# Patient Record
Sex: Female | Born: 1945 | Race: Black or African American | Hispanic: No | Marital: Single | Smoking: Never smoker
Health system: Southern US, Community
[De-identification: ages and names within clinical notes are randomized; demographics above are authoritative.]

## PROBLEM LIST (undated history)

## (undated) DIAGNOSIS — I10 Essential (primary) hypertension: Secondary | ICD-10-CM

## (undated) DIAGNOSIS — K219 Gastro-esophageal reflux disease without esophagitis: Secondary | ICD-10-CM

## (undated) DIAGNOSIS — M1990 Unspecified osteoarthritis, unspecified site: Secondary | ICD-10-CM

## (undated) DIAGNOSIS — D594 Other nonautoimmune hemolytic anemias: Secondary | ICD-10-CM

## (undated) HISTORY — DX: Gastro-esophageal reflux disease without esophagitis: K21.9

## (undated) HISTORY — DX: Other nonautoimmune hemolytic anemias: D59.4

## (undated) HISTORY — DX: Unspecified osteoarthritis, unspecified site: M19.90

## (undated) HISTORY — PX: BUNIONECTOMY: SHX129

## (undated) HISTORY — DX: Essential (primary) hypertension: I10

---

## 1986-01-28 HISTORY — PX: TUBAL LIGATION: SHX77

## 1997-04-28 ENCOUNTER — Encounter: Admission: RE | Admit: 1997-04-28 | Discharge: 1997-07-27 | Payer: Self-pay | Admitting: Family Medicine

## 1998-04-24 ENCOUNTER — Encounter: Admission: RE | Admit: 1998-04-24 | Discharge: 1998-04-28 | Payer: Self-pay | Admitting: Family Medicine

## 1998-11-27 ENCOUNTER — Encounter: Payer: Self-pay | Admitting: Obstetrics and Gynecology

## 1998-11-27 ENCOUNTER — Encounter: Admission: RE | Admit: 1998-11-27 | Discharge: 1998-11-27 | Payer: Self-pay | Admitting: Obstetrics and Gynecology

## 1998-12-04 ENCOUNTER — Encounter: Payer: Self-pay | Admitting: Obstetrics and Gynecology

## 1998-12-04 ENCOUNTER — Encounter: Admission: RE | Admit: 1998-12-04 | Discharge: 1998-12-04 | Payer: Self-pay | Admitting: Obstetrics and Gynecology

## 1999-12-06 ENCOUNTER — Encounter: Payer: Self-pay | Admitting: Obstetrics and Gynecology

## 1999-12-06 ENCOUNTER — Encounter: Admission: RE | Admit: 1999-12-06 | Discharge: 1999-12-06 | Payer: Self-pay | Admitting: Obstetrics and Gynecology

## 1999-12-17 ENCOUNTER — Encounter: Admission: RE | Admit: 1999-12-17 | Discharge: 1999-12-17 | Payer: Self-pay | Admitting: Obstetrics and Gynecology

## 1999-12-17 ENCOUNTER — Encounter: Payer: Self-pay | Admitting: Obstetrics and Gynecology

## 2000-07-17 ENCOUNTER — Other Ambulatory Visit: Admission: RE | Admit: 2000-07-17 | Discharge: 2000-07-17 | Payer: Self-pay | Admitting: Obstetrics and Gynecology

## 2000-12-17 ENCOUNTER — Encounter: Payer: Self-pay | Admitting: Obstetrics and Gynecology

## 2000-12-17 ENCOUNTER — Encounter: Admission: RE | Admit: 2000-12-17 | Discharge: 2000-12-17 | Payer: Self-pay | Admitting: Obstetrics and Gynecology

## 2002-02-24 ENCOUNTER — Encounter: Admission: RE | Admit: 2002-02-24 | Discharge: 2002-02-24 | Payer: Self-pay | Admitting: Internal Medicine

## 2002-02-24 ENCOUNTER — Encounter: Payer: Self-pay | Admitting: Internal Medicine

## 2002-09-23 ENCOUNTER — Encounter: Payer: Self-pay | Admitting: Obstetrics and Gynecology

## 2002-09-23 ENCOUNTER — Encounter: Admission: RE | Admit: 2002-09-23 | Discharge: 2002-09-23 | Payer: Self-pay | Admitting: Obstetrics and Gynecology

## 2003-01-29 HISTORY — PX: TOE SURGERY: SHX1073

## 2003-10-06 ENCOUNTER — Encounter: Admission: RE | Admit: 2003-10-06 | Discharge: 2003-10-06 | Payer: Self-pay | Admitting: Obstetrics and Gynecology

## 2004-01-20 ENCOUNTER — Ambulatory Visit: Payer: Self-pay | Admitting: Family Medicine

## 2004-01-27 ENCOUNTER — Ambulatory Visit: Payer: Self-pay | Admitting: Family Medicine

## 2004-02-10 ENCOUNTER — Ambulatory Visit: Payer: Self-pay | Admitting: Family Medicine

## 2004-02-10 ENCOUNTER — Ambulatory Visit: Payer: Self-pay | Admitting: Internal Medicine

## 2004-02-21 ENCOUNTER — Ambulatory Visit: Payer: Self-pay | Admitting: Internal Medicine

## 2005-02-27 ENCOUNTER — Ambulatory Visit: Payer: Self-pay | Admitting: Family Medicine

## 2005-03-20 ENCOUNTER — Ambulatory Visit: Payer: Self-pay | Admitting: Family Medicine

## 2005-04-24 ENCOUNTER — Encounter: Admission: RE | Admit: 2005-04-24 | Discharge: 2005-04-24 | Payer: Self-pay | Admitting: Family Medicine

## 2005-05-02 ENCOUNTER — Ambulatory Visit: Payer: Self-pay | Admitting: Family Medicine

## 2005-05-10 ENCOUNTER — Ambulatory Visit: Payer: Self-pay | Admitting: Family Medicine

## 2005-05-31 ENCOUNTER — Ambulatory Visit: Payer: Self-pay | Admitting: Family Medicine

## 2005-09-03 ENCOUNTER — Ambulatory Visit: Payer: Self-pay | Admitting: Family Medicine

## 2006-02-04 ENCOUNTER — Ambulatory Visit: Payer: Self-pay | Admitting: Family Medicine

## 2006-02-04 ENCOUNTER — Encounter: Admission: RE | Admit: 2006-02-04 | Discharge: 2006-02-04 | Payer: Self-pay | Admitting: Family Medicine

## 2006-02-04 LAB — CONVERTED CEMR LAB
Anti Nuclear Antibody(ANA): NEGATIVE
Basophils Absolute: 0.1 10*3/uL (ref 0.0–0.1)
Basophils Relative: 1.1 % — ABNORMAL HIGH (ref 0.0–1.0)
Eosinophil percent: 1.8 % (ref 0.0–5.0)
HCT: 37.6 % (ref 36.0–46.0)
Hemoglobin: 12.4 g/dL (ref 12.0–15.0)
Lymphocytes Relative: 41.7 % (ref 12.0–46.0)
MCHC: 33 g/dL (ref 30.0–36.0)
MCV: 82.9 fL (ref 78.0–100.0)
Platelets: 319 10*3/uL (ref 150–400)
RDW: 12.9 % (ref 11.5–14.6)
Rheumatoid Fact: <20 intl units/mL — ABNORMAL LOW (ref 0.0–20.0)
Uric Acid, Serum: 5.2 mg/dL (ref 2.4–7.0)

## 2006-02-13 ENCOUNTER — Ambulatory Visit: Payer: Self-pay | Admitting: Family Medicine

## 2006-02-18 ENCOUNTER — Ambulatory Visit: Payer: Self-pay | Admitting: Family Medicine

## 2006-03-27 ENCOUNTER — Ambulatory Visit: Payer: Self-pay | Admitting: Family Medicine

## 2006-04-16 ENCOUNTER — Ambulatory Visit: Payer: Self-pay | Admitting: Family Medicine

## 2006-04-16 LAB — CONVERTED CEMR LAB
ALT: 18 U/L
AST: 24 U/L
Albumin: 3.7 g/dL
Alkaline Phosphatase: 60 U/L
BUN: 13 mg/dL
Basophils Absolute: 0 10*3/uL
Basophils Relative: 0.7 %
Bilirubin, Direct: 0.1 mg/dL
CO2: 31 meq/L
Calcium: 8.9 mg/dL
Chloride: 108 meq/L
Cholesterol: 176 mg/dL
Creatinine, Ser: 0.8 mg/dL
Eosinophils Absolute: 0.1 10*3/uL
Eosinophils Relative: 2 %
GFR calc Af Amer: 95 mL/min
GFR calc non Af Amer: 78 mL/min
Glucose, Bld: 90 mg/dL
HCT: 36.1 %
HDL: 72.6 mg/dL
Hemoglobin: 12.3 g/dL
LDL Cholesterol: 93 mg/dL
Lymphocytes Relative: 39.3 %
MCHC: 33.9 g/dL
MCV: 82.2 fL
Monocytes Absolute: 0.6 10*3/uL
Monocytes Relative: 10.5 %
Neutro Abs: 2.7 10*3/uL
Neutrophils Relative %: 47.5 %
Platelets: 371 10*3/uL
Potassium: 3.4 meq/L — ABNORMAL LOW
RBC: 4.39 M/uL
RDW: 13.1 %
Sodium: 145 meq/L
TSH: 1.07 u[IU]/mL
Total Bilirubin: 1 mg/dL
Total CHOL/HDL Ratio: 2.4
Total Protein: 6.9 g/dL
Triglycerides: 54 mg/dL
VLDL: 11 mg/dL
WBC: 5.6 10*3/uL

## 2006-05-06 ENCOUNTER — Encounter: Admission: RE | Admit: 2006-05-06 | Discharge: 2006-05-06 | Payer: Self-pay | Admitting: Family Medicine

## 2006-06-17 ENCOUNTER — Other Ambulatory Visit: Admission: RE | Admit: 2006-06-17 | Discharge: 2006-06-17 | Payer: Self-pay | Admitting: Family Medicine

## 2006-06-17 ENCOUNTER — Ambulatory Visit: Payer: Self-pay | Admitting: Family Medicine

## 2006-06-17 ENCOUNTER — Encounter: Payer: Self-pay | Admitting: Family Medicine

## 2006-06-17 LAB — CONVERTED CEMR LAB

## 2006-06-18 ENCOUNTER — Encounter: Payer: Self-pay | Admitting: Family Medicine

## 2006-06-18 LAB — CONVERTED CEMR LAB

## 2006-07-08 ENCOUNTER — Ambulatory Visit: Payer: Self-pay | Admitting: Family Medicine

## 2006-07-08 DIAGNOSIS — M810 Age-related osteoporosis without current pathological fracture: Secondary | ICD-10-CM | POA: Insufficient documentation

## 2006-07-08 DIAGNOSIS — M8580 Other specified disorders of bone density and structure, unspecified site: Secondary | ICD-10-CM

## 2006-07-08 DIAGNOSIS — I10 Essential (primary) hypertension: Secondary | ICD-10-CM | POA: Insufficient documentation

## 2007-01-29 HISTORY — PX: KNEE ARTHROSCOPY: SUR90

## 2007-05-07 ENCOUNTER — Encounter: Admission: RE | Admit: 2007-05-07 | Discharge: 2007-05-07 | Payer: Self-pay | Admitting: Family Medicine

## 2007-06-10 ENCOUNTER — Ambulatory Visit: Payer: Self-pay | Admitting: Family Medicine

## 2007-06-10 LAB — CONVERTED CEMR LAB
ALT: 15 units/L (ref 0–35)
AST: 19 units/L (ref 0–37)
Albumin: 3.6 g/dL (ref 3.5–5.2)
BUN: 14 mg/dL (ref 6–23)
Basophils Relative: 0.6 % (ref 0.0–1.0)
Blood in Urine, dipstick: NEGATIVE
CO2: 29 meq/L (ref 19–32)
Chloride: 109 meq/L (ref 96–112)
Cholesterol: 176 mg/dL (ref 0–200)
Creatinine, Ser: 0.8 mg/dL (ref 0.4–1.2)
Glucose, Bld: 102 mg/dL — ABNORMAL HIGH (ref 70–99)
Glucose, Urine, Semiquant: NEGATIVE
HCT: 35.5 % — ABNORMAL LOW (ref 36.0–46.0)
HDL: 72.9 mg/dL (ref >39.0)
Hemoglobin: 11.6 g/dL — ABNORMAL LOW (ref 12.0–15.0)
LDL Cholesterol: 96 mg/dL (ref 0–99)
MCHC: 32.5 g/dL (ref 30.0–36.0)
Monocytes Absolute: 0.5 10*3/uL (ref 0.1–1.0)
Neutro Abs: 2.6 10*3/uL (ref 1.4–7.7)
Nitrite: NEGATIVE
Potassium: 3.4 meq/L — ABNORMAL LOW (ref 3.5–5.1)
Sodium: 143 meq/L (ref 135–145)
TSH: 0.64 microintl units/mL (ref 0.35–5.50)
Total CHOL/HDL Ratio: 2.4
Triglycerides: 36 mg/dL (ref 0–149)
Urobilinogen, UA: 0.2
VLDL: 7 mg/dL (ref 0–40)
WBC: 5.2 10*3/uL (ref 4.5–10.5)
pH: 5.5

## 2007-06-18 ENCOUNTER — Ambulatory Visit: Payer: Self-pay | Admitting: Family Medicine

## 2007-06-18 ENCOUNTER — Encounter: Payer: Self-pay | Admitting: Family Medicine

## 2007-06-18 ENCOUNTER — Other Ambulatory Visit: Admission: RE | Admit: 2007-06-18 | Discharge: 2007-06-18 | Payer: Self-pay | Admitting: Family Medicine

## 2007-06-18 DIAGNOSIS — D6489 Other specified anemias: Secondary | ICD-10-CM | POA: Insufficient documentation

## 2007-06-18 LAB — CONVERTED CEMR LAB
Eosinophils Absolute: 0.1 10*3/uL (ref 0.0–0.7)
Hemoglobin: 12 g/dL (ref 12.0–15.0)
Iron: 64 ug/dL (ref 42–145)
MCV: 83.2 fL (ref 78.0–100.0)
Monocytes Relative: 9.3 % (ref 3.0–12.0)
Neutro Abs: 3.2 10*3/uL (ref 1.4–7.7)
Platelets: 349 10*3/uL (ref 150–400)
RBC: 4.47 M/uL (ref 3.87–5.11)
RDW: 12.9 % (ref 11.5–14.6)
Retic Ct Pct: 0.8 % (ref 0.4–3.1)
Saturation Ratios: 19.4 % — ABNORMAL LOW (ref 20.0–50.0)
Vitamin B-12: 770 pg/mL (ref 211–911)
WBC: 6.8 10*3/uL (ref 4.5–10.5)

## 2007-06-21 ENCOUNTER — Telehealth: Payer: Self-pay | Admitting: Family Medicine

## 2007-07-10 DIAGNOSIS — M25569 Pain in unspecified knee: Secondary | ICD-10-CM

## 2007-07-20 ENCOUNTER — Ambulatory Visit: Payer: Self-pay | Admitting: Family Medicine

## 2007-07-23 ENCOUNTER — Ambulatory Visit: Payer: Self-pay | Admitting: Family Medicine

## 2007-07-24 ENCOUNTER — Telehealth: Payer: Self-pay | Admitting: Family Medicine

## 2007-08-03 ENCOUNTER — Ambulatory Visit: Payer: Self-pay | Admitting: Family Medicine

## 2007-08-03 DIAGNOSIS — M79609 Pain in unspecified limb: Secondary | ICD-10-CM | POA: Insufficient documentation

## 2008-03-05 DIAGNOSIS — J069 Acute upper respiratory infection, unspecified: Secondary | ICD-10-CM | POA: Insufficient documentation

## 2008-03-11 ENCOUNTER — Ambulatory Visit: Payer: Self-pay | Admitting: Family Medicine

## 2008-03-11 DIAGNOSIS — J45909 Unspecified asthma, uncomplicated: Secondary | ICD-10-CM | POA: Insufficient documentation

## 2008-04-21 DIAGNOSIS — G43819 Other migraine, intractable, without status migrainosus: Secondary | ICD-10-CM

## 2008-04-21 DIAGNOSIS — G43119 Migraine with aura, intractable, without status migrainosus: Secondary | ICD-10-CM | POA: Insufficient documentation

## 2008-04-27 ENCOUNTER — Ambulatory Visit: Payer: Self-pay | Admitting: Family Medicine

## 2008-05-09 ENCOUNTER — Encounter: Admission: RE | Admit: 2008-05-09 | Discharge: 2008-05-09 | Payer: Self-pay | Admitting: Family Medicine

## 2008-06-10 ENCOUNTER — Ambulatory Visit: Payer: Self-pay | Admitting: Family Medicine

## 2008-06-10 LAB — CONVERTED CEMR LAB
ALT: 15 units/L (ref 0–35)
Alkaline Phosphatase: 69 units/L (ref 39–117)
Basophils Absolute: 0 10*3/uL (ref 0.0–0.1)
Basophils Relative: 0.7 % (ref 0.0–3.0)
Bilirubin Urine: NEGATIVE
Bilirubin, Direct: 0 mg/dL (ref 0.0–0.3)
CO2: 32 meq/L (ref 19–32)
Chloride: 108 meq/L (ref 96–112)
Creatinine, Ser: 1 mg/dL (ref 0.4–1.2)
Eosinophils Absolute: 0.1 10*3/uL (ref 0.0–0.7)
Eosinophils Relative: 2.4 % (ref 0.0–5.0)
GFR calc non Af Amer: 72.6 mL/min (ref >60)
Glucose, Urine, Semiquant: NEGATIVE
HCT: 35 % — ABNORMAL LOW (ref 36.0–46.0)
HDL: 75.6 mg/dL (ref >39.00)
Hemoglobin: 11.4 g/dL — ABNORMAL LOW (ref 12.0–15.0)
LDL Cholesterol: 99 mg/dL (ref 0–99)
Lymphs Abs: 2 10*3/uL (ref 0.7–4.0)
Monocytes Relative: 9.7 % (ref 3.0–12.0)
Neutrophils Relative %: 49.1 % (ref 43.0–77.0)
Potassium: 3.7 meq/L (ref 3.5–5.1)
RBC: 4.13 M/uL (ref 3.87–5.11)
Specific Gravity, Urine: 1.025
TSH: 1 microintl units/mL (ref 0.35–5.50)
Total CHOL/HDL Ratio: 2
Total Protein: 6.6 g/dL (ref 6.0–8.3)
WBC Urine, dipstick: NEGATIVE
pH: 6

## 2008-06-16 ENCOUNTER — Ambulatory Visit: Payer: Self-pay | Admitting: Family Medicine

## 2008-06-16 ENCOUNTER — Encounter: Payer: Self-pay | Admitting: Family Medicine

## 2008-06-16 ENCOUNTER — Other Ambulatory Visit: Admission: RE | Admit: 2008-06-16 | Discharge: 2008-06-16 | Payer: Self-pay | Admitting: Family Medicine

## 2008-08-11 ENCOUNTER — Telehealth: Payer: Self-pay | Admitting: Family Medicine

## 2008-08-11 ENCOUNTER — Ambulatory Visit: Payer: Self-pay | Admitting: Family Medicine

## 2008-08-11 LAB — CONVERTED CEMR LAB
Basophils Absolute: 0.1 10*3/uL (ref 0.0–0.1)
HCT: 34.6 % — ABNORMAL LOW (ref 36.0–46.0)
Lymphs Abs: 1.9 10*3/uL (ref 0.7–4.0)
MCHC: 33.6 g/dL (ref 30.0–36.0)
Monocytes Relative: 9.9 % (ref 3.0–12.0)
Neutro Abs: 2.6 10*3/uL (ref 1.4–7.7)
Platelets: 281 10*3/uL (ref 150.0–400.0)
RBC: 4.13 M/uL (ref 3.87–5.11)
WBC: 5.2 10*3/uL (ref 4.5–10.5)

## 2008-08-12 ENCOUNTER — Ambulatory Visit: Payer: Self-pay | Admitting: Family Medicine

## 2008-10-05 ENCOUNTER — Ambulatory Visit: Payer: Self-pay | Admitting: Family Medicine

## 2008-10-06 LAB — CONVERTED CEMR LAB
Eosinophils Absolute: 0.1 10*3/uL (ref 0.0–0.7)
Eosinophils Relative: 1.3 % (ref 0.0–5.0)
HCT: 37.6 % (ref 36.0–46.0)
Hemoglobin: 12.5 g/dL (ref 12.0–15.0)
Lymphocytes Relative: 34.6 % (ref 12.0–46.0)
MCHC: 33.2 g/dL (ref 30.0–36.0)
MCV: 84 fL (ref 78.0–100.0)
Monocytes Absolute: 0.5 10*3/uL (ref 0.1–1.0)
Monocytes Relative: 8.3 % (ref 3.0–12.0)
Neutrophils Relative %: 55.5 % (ref 43.0–77.0)
Platelets: 304 10*3/uL (ref 150.0–400.0)
RBC: 4.48 M/uL (ref 3.87–5.11)
RDW: 12.8 % (ref 11.5–14.6)

## 2008-12-26 ENCOUNTER — Telehealth: Payer: Self-pay | Admitting: Family Medicine

## 2009-05-11 ENCOUNTER — Encounter: Admission: RE | Admit: 2009-05-11 | Discharge: 2009-05-11 | Payer: Self-pay | Admitting: Family Medicine

## 2009-05-11 LAB — HM MAMMOGRAPHY

## 2009-06-14 ENCOUNTER — Ambulatory Visit: Payer: Self-pay | Admitting: Family Medicine

## 2009-06-14 LAB — CONVERTED CEMR LAB
Alkaline Phosphatase: 55 units/L (ref 39–117)
BUN: 21 mg/dL (ref 6–23)
Calcium: 9.5 mg/dL (ref 8.4–10.5)
Chloride: 102 meq/L (ref 96–112)
Cholesterol: 202 mg/dL — ABNORMAL HIGH (ref 0–200)
Creatinine, Ser: 0.8 mg/dL (ref 0.4–1.2)
Glucose, Bld: 90 mg/dL (ref 70–99)
HDL: 89.6 mg/dL (ref >39.00)
Lymphs Abs: 2.3 10*3/uL (ref 0.7–4.0)
MCHC: 33.9 g/dL (ref 30.0–36.0)
MCV: 82.7 fL (ref 78.0–100.0)
Monocytes Absolute: 0.5 10*3/uL (ref 0.1–1.0)
Monocytes Relative: 7.9 % (ref 3.0–12.0)
Neutro Abs: 3 10*3/uL (ref 1.4–7.7)
Neutrophils Relative %: 50.7 % (ref 43.0–77.0)
Nitrite: NEGATIVE
Platelets: 325 10*3/uL (ref 150.0–400.0)
RDW: 14.2 % (ref 11.5–14.6)
Specific Gravity, Urine: >=1.03
Total Bilirubin: 1.1 mg/dL (ref 0.3–1.2)
Total CHOL/HDL Ratio: 2
Total Protein: 7.3 g/dL (ref 6.0–8.3)
Triglycerides: 32 mg/dL (ref 0.0–149.0)
Urobilinogen, UA: 0.2
WBC Urine, dipstick: NEGATIVE
WBC: 5.9 10*3/uL (ref 4.5–10.5)

## 2009-06-20 ENCOUNTER — Other Ambulatory Visit: Admission: RE | Admit: 2009-06-20 | Discharge: 2009-06-20 | Payer: Self-pay | Admitting: Family Medicine

## 2009-06-20 ENCOUNTER — Ambulatory Visit: Payer: Self-pay | Admitting: Family Medicine

## 2009-06-20 LAB — CONVERTED CEMR LAB: Pap Smear: NEGATIVE

## 2009-06-20 LAB — HM PAP SMEAR

## 2009-08-01 ENCOUNTER — Ambulatory Visit: Payer: Self-pay | Admitting: Family Medicine

## 2010-01-16 ENCOUNTER — Ambulatory Visit: Payer: Self-pay | Admitting: Family Medicine

## 2010-02-09 ENCOUNTER — Ambulatory Visit
Admission: RE | Admit: 2010-02-09 | Discharge: 2010-02-09 | Payer: Self-pay | Source: Home / Self Care | Attending: Family Medicine | Admitting: Family Medicine

## 2010-02-27 NOTE — Assessment & Plan Note (Signed)
Summary: COUGH/NASAL CONGESTION/BP/NJR   Vital Signs:  Patient profile:   65 year old female Menstrual status:  postmenopausal Temp:     98.9 degrees F 140oral Resp:     96 per minute BP sitting:   140 / 96  (left arm) Cuff size:   regular  Vitals Entered By: Kern Reap CMA Duncan Dull) (August 01, 2009 4:46 PM) CC: head congestion   CC:  head congestion.  History of Present Illness: Kayla Key is a 65 year old female, who comes in with a week's history of head congestion, postnasal drip, and cough.  No fever, chills, sputum production, asthma, etc.  Allergies: No Known Drug Allergies  Past History:  Past medical, surgical, family and social histories (including risk factors) reviewed, and no changes noted (except as noted below).  Past Medical History: Reviewed history from 03/11/2008 and no changes required. Hypertension Osteoporosis MHA Asthma  Past Surgical History: Reviewed history from 07/08/2006 and no changes required. BTL  Family History: Reviewed history from 06/16/2008 and no changes required. father died of a stroke in 1998/06/28 mother died of TB.  A brother has a pacemaker a sister with hypertension Family History Breast cancer 1st degree relative <50  Social History: Reviewed history from 06/18/2007 and no changes required. Retired Married Never Smoked Alcohol use-no Drug use-no Regular exercise-yes  Review of Systems      See HPI  Physical Exam  General:  Well-developed,well-nourished,in no acute distress; alert,appropriate and cooperative throughout examination Head:  Normocephalic and atraumatic without obvious abnormalities. No apparent alopecia or balding. Eyes:  No corneal or conjunctival inflammation noted. EOMI. Perrla. Funduscopic exam benign, without hemorrhages, exudates or papilledema. Vision grossly normal. Ears:  bilateral cerumen impactions Nose:  External nasal examination shows no deformity or inflammation. Nasal mucosa are pink and  moist without lesions or exudates. Mouth:  Oral mucosa and oropharynx without lesions or exudates.  Teeth in good repair. Neck:  No deformities, masses, or tenderness noted. Chest Wall:  No deformities, masses, or tenderness noted. Lungs:  Normal respiratory effort, chest expands symmetrically. Lungs are clear to auscultation, no crackles or wheezes.   Problems:  Medical Problems Added: 1)  Dx of Viral Infection-unspec  (ICD-079.99)  Impression & Recommendations:  Problem # 1:  VIRAL INFECTION-UNSPEC (ICD-079.99) Assessment New  Her updated medication list for this problem includes:    Hydromet 5-1.5 Mg/51ml Syrp (Hydrocodone-homatropine) .Marland Kitchen... 1 or 2 tsps at bedtime as needed  Complete Medication List: 1)  Nadolol 20 Mg Tabs (Nadolol) .... Once daily 2)  Fosamax 70 Mg Tabs (Alendronate sodium) .... One weekly 3)  Hydromet 5-1.5 Mg/46ml Syrp (Hydrocodone-homatropine) .Marland Kitchen.. 1 or 2 tsps at bedtime as needed  Patient Instructions: 1)  Get plenty of rest, drink lots of clear liquids, and use Tylenol or Ibuprofen for fever and comfort. Return in 7-10 days if you're not better:sooner if you're feeling worse. 2)  Take 650-1000mg  of Tylenol every 4-6 hours as needed for relief of pain or comfort of fever AVOID taking more than 4000mg   in a 24 hour period (can cause liver damage in higher doses). 3)  one or 2 teaspoons of Tagamet nightly p.r.n. for cough.  Return in 10 days for ear wax removal Prescriptions: HYDROMET 5-1.5 MG/5ML SYRP (HYDROCODONE-HOMATROPINE) 1 or 2 tsps at bedtime as needed  #4oz x 1   Entered and Authorized by:   Roderick Pee MD   Signed by:   Roderick Pee MD on 08/01/2009   Method used:   Print then  Give to Patient   RxID:   [id number]

## 2010-02-27 NOTE — Assessment & Plan Note (Signed)
Summary: cpx/pap/cjr   Vital Signs:  Patient profile:   65 year old female Menstrual status:  postmenopausal Height:      62.5 inches Weight:      125 pounds BMI:     22.58 Temp:     97.9 degrees F oral BP sitting:   102 / 68  (left arm) Cuff size:   regular  Vitals Entered By: Kern Reap CMA Duncan Dull) (Jun 20, 2009 3:56 PM) CC: cpx Is Patient Diabetic? No Pain Assessment Patient in pain? no        CC:  cpx.  History of Present Illness: Kayla Key is a 65 year old female, nonsmoker, who comes in today for evaluation of hypertension, osteoporosis, and migraine headaches.  Her hypertension is treated with Corgard 20 mg daily and her thiazide 25 mg daily.  BP 110/68.  Will drop the HCTZ.  She takes Fosamax one tablet weekly.  Discussed using at first 3 years and then stopping.  She did not get the Zomig and refill because her migraines and stopped.  Review of systems negative.  Tetanus 06/18/06  She gets routine eye care, dental care, does BSE monthly........ has fibrocystic changes, and sister. 3 years younger, was diagnosed last year with breast cancer.......Marland Kitchen gets annual mammography.  Colonoscopy Jun 17, 2004 normal.  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  Past Medical History: Reviewed history from 03/11/2008 and no changes required. Hypertension Osteoporosis MHA Asthma  Past Surgical History: Reviewed history from 07/08/2006 and no changes required. BTL  Family History: Reviewed history from 06/16/2008 and no changes required. father died of a stroke in 06-18-1998 mother died of TB.  A brother has a pacemaker a sister with hypertension Family History Breast cancer 1st degree relative <50  Social History: Reviewed history from 06/18/2007 and no changes  required. Retired Married Never Smoked Alcohol use-no Drug use-no Regular exercise-yes  Review of Systems      See HPI  Physical Exam  General:  Well-developed,well-nourished,in no acute distress; alert,appropriate and cooperative throughout examination Head:  Normocephalic and atraumatic without obvious abnormalities. No apparent alopecia or balding. Eyes:  No corneal or conjunctival inflammation noted. EOMI. Perrla. Funduscopic exam benign, without hemorrhages, exudates or papilledema. Vision grossly normal. Ears:  External ear exam shows no significant lesions or deformities.  Otoscopic examination reveals clear canals, tympanic membranes are intact bilaterally without bulging, retraction, inflammation or discharge. Hearing is grossly normal bilaterally. Nose:  External nasal examination shows no deformity or inflammation. Nasal mucosa are pink and moist without lesions or exudates. Mouth:  Oral mucosa and oropharynx without lesions or exudates.  Teeth in good repair. Neck:  No deformities, masses, or tenderness noted. Chest Wall:  No deformities, masses, or tenderness noted. Breasts:  No mass, nodules, thickening, tenderness, bulging, retraction, inflamation, nipple discharge or skin changes noted.   Lungs:  Normal respiratory effort, chest expands symmetrically. Lungs are clear to auscultation, no crackles or wheezes. Heart:  Normal rate and regular rhythm. S1 and S2 normal without gallop, murmur, click, rub or other extra sounds. Abdomen:  Bowel sounds positive,abdomen soft and non-tender without masses, organomegaly or hernias noted. Rectal:  No external abnormalities noted. Normal sphincter tone. No rectal masses or tenderness. Genitalia:  Pelvic Exam:        External: normal female genitalia without lesions or masses  Vagina: normal without lesions or masses        Cervix: normal without lesions or masses        Adnexa: normal bimanual exam without masses or fullness         Uterus: normal by palpation        Pap smear: performed Msk:  No deformity or scoliosis noted of thoracic or lumbar spine.   Pulses:  R and L carotid,radial,femoral,dorsalis pedis and posterior tibial pulses are full and equal bilaterally Extremities:  No clubbing, cyanosis, edema, or deformity noted with normal full range of motion of all joints.   Neurologic:  No cranial nerve deficits noted. Station and gait are normal. Plantar reflexes are down-going bilaterally. DTRs are symmetrical throughout. Sensory, motor and coordinative functions appear intact. Skin:  Intact without suspicious lesions or rashes Cervical Nodes:  No lymphadenopathy noted Axillary Nodes:  No palpable lymphadenopathy Inguinal Nodes:  No significant adenopathy Psych:  Cognition and judgment appear intact. Alert and cooperative with normal attention span and concentration. 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 Uad    Vicodin Es 7.5-750 Mg Tabs (Hydrocodone-acetaminophen) .Marland Kitchen... Take 1 tablet by mouth three times a day Her updated medication list for this problem includes:    Nadolol 20 Mg Tabs (Nadolol) ..... Once daily  Orders: Prescription Created Electronically [phone])  Problem # 2:  OSTEOPOROSIS (ICD-733.00) Assessment: Improved  Her updated medication list for this problem includes:    Fosamax 70 Mg Tabs (Alendronate sodium) ..... One weekly  Orders: Prescription Created Electronically [id number])  Problem # 3:  HYPERTENSION (ICD-401.9) Assessment: Improved  The following medications were removed from the medication list:    Hydrochlorothiazide 25 Mg Tabs (Hydrochlorothiazide) .Marland Kitchen... Take 1 tablet by mouth every morning Her updated medication list for this problem  includes:    Nadolol 20 Mg Tabs (Nadolol) ..... Once daily  Orders: Prescription Created Electronically [phone]) EKG w/ Interpretation (93000)  Problem # 4:  Preventive Health Care (ICD-V70.0) Assessment: Unchanged  Complete Medication List: 1)  Nadolol 20 Mg Tabs (Nadolol) .... Once daily 2)  Fosamax 70 Mg Tabs (Alendronate sodium) .... One weekly  Patient Instructions: 1)  stopped the HCTZ, continued nadolol  20 mg daily.  Check your blood pressure daily follow up in 4 weeks.  When u  returned bring a record of all your blood pressure readings 2)  Please schedule a follow-up appointment in 1 year. 3)  Schedule your mammogram. 4)  Schedule a colonoscopy/sigmoidoscopy to help detect colon cancer. 5)  Take an Aspirin every day...Marland KitchenMarland Kitchen81 mg baby  6)  Stop the Fosamax............ take a calcium, vitamin D supplement daily Prescriptions: FOSAMAX 70 MG  TABS (ALENDRONATE SODIUM) one weekly  #12 x 4   Entered and Authorized by:   Roderick Pee MD   Signed by:   Roderick Pee MD on 06/20/2009   Method used:   Electronically to        [facility]  [address] [id number] (retail)       [address]       [place], [place]  [address]       Ph: [phone] or [phone]       Fax: [phone]   RxID:   [id number] NADOLOL 20 MG  TABS (  NADOLOL) once daily  #100 Tablet x 3   Entered and Authorized by:   Roderick Pee MD   Signed by:   Roderick Pee MD on 06/20/2009   Method used:   [facility]ronically to        [facility]  [address] [id number] (retail[facility]       [address]       [place], [place]  [address]       Ph: [phone] or [phone]       Fax: [phone]   RxID:   [id number]

## 2010-03-01 NOTE — Assessment & Plan Note (Signed)
Summary: elevated BP/dm   Vital Signs:  Patient profile:   65 year old female Menstrual status:  postmenopausal Weight:      128 pounds Temp:     98.5 degrees F oral BP sitting:   140 / 90  (left arm) Cuff size:   regular  Vitals Entered By: Kern Reap CMA Duncan Dull) (January 16, 2010 8:55 AM) CC: elevated blood pressure, headaches   CC:  elevated blood pressure and headaches.  History of Present Illness: Kayla Key is a 65 year old female comes in today for reevaluation of hypertension.  She is on Corgard 20 mg q.a.m.  Blood pressure is home now is 150 to 160 systolic, diastolic 80 to 90.  Review of systems negative except for a mild headache  Allergies: No Known Drug Allergies  Past History:  Past medical, surgical, family and social histories (including risk factors) reviewed for relevance to current acute and chronic problems.  Past Medical History: Reviewed history from 03/11/2008 and no changes required. Hypertension Osteoporosis MHA Asthma  Past Surgical History: Reviewed history from 07/08/2006 and no changes required. BTL  Family History: Reviewed history from 06/16/2008 and no changes required. father died of a stroke in 1998-07-01 mother died of TB.  A brother has a pacemaker a sister with hypertension Family History Breast cancer 1st degree relative <50  Social History: Reviewed history from 06/18/2007 and no changes required. Retired Married Never Smoked Alcohol use-no Drug use-no Regular exercise-yes  Review of Systems      See HPI  Physical Exam  General:  Well-developed,well-nourished,in no acute distress; alert,appropriate and cooperative throughout examination Heart:  160/90 right arm sitting position   Impression & Recommendations:  Problem # 1:  HYPERTENSION (ICD-401.9) Assessment Deteriorated  Her updated medication list for this problem includes:    Nadolol 20 Mg Tabs (Nadolol) ..... Once daily    Hydrochlorothiazide 25 Mg Tabs  (Hydrochlorothiazide) .Marland Kitchen... Take 1 tablet by mouth every morning  Orders: Prescription Created Electronically [id number])  Complete Medication List: 1)  Nadolol 20 Mg Tabs (Nadolol) .... Once daily 2)  Hydrochlorothiazide 25 Mg Tabs (Hydrochlorothiazide) .... Take 1 tablet by mouth every morning  Patient Instructions: 1)  add 25 mg of hydrochlorothiazide every morning. 2)  Check a daily blood pressure in the morning. 3)  Return January, the 11th for follow-up with the data and the device Prescriptions: HYDROCHLOROTHIAZIDE 25 MG TABS (HYDROCHLOROTHIAZIDE) Take 1 tablet by mouth every morning  #100 x 3   Entered and Authorized by:   Roderick Pee MD   Signed by:   Roderick Pee MD on 01/16/2010   Method used:   [facility] to        [facility]  [address] [id number]       [address]       [place], [place]  [address]       Ph: [phone] or [phone]       Fax: [phone]   RxID:   [id number]    Orders Added: 1)  Prescription Created Electronically [[id number]] 2)  Est. Patient Level III [56433]

## 2010-03-01 NOTE — Assessment & Plan Note (Signed)
Summary: 2 wk rov/mm   Vital Signs:  Patient profile:   65 year old female Menstrual status:  postmenopausal Weight:      126 pounds Temp:     98.2 degrees F oral BP sitting:   110 / 70  (left arm) Cuff size:   regular  Vitals Entered By: Romualdo Bolk, CMA (AAMA) (February 09, 2010 9:10 AM) CC: 2 Week Rov   CC:  2 Week Rov.  History of Present Illness: Kayla Key is a 65 year old female, who comes in today for evaluation of joint pain, and hypertension.  She is on Corgard 20 mg daily and hydrochlorothiazide 25 mg daily.  BP 120/80.  No side effects from medication.  She also has diffuse joint pain.  It tends to be worse in the morning and better when she gets up and moves around also worse when he gets cold.  Review of systems negative  Preventive Screening-Counseling & Management  Alcohol-Tobacco     Smoking Status: never  Caffeine-Diet-Exercise     Does Patient Exercise: yes  Current Medications (verified): 1)  Nadolol 20 Mg  Tabs (Nadolol) .... Once Daily 2)  Hydrochlorothiazide 25 Mg Tabs (Hydrochlorothiazide) .... Take 1 Tablet By Mouth Every Morning  Allergies (verified): No Known Drug Allergies  Past History:  Past medical, surgical, family and social histories (including risk factors) reviewed for relevance to current acute and chronic problems.  Past Medical History: Reviewed history from 03/11/2008 and no changes required. Hypertension Osteoporosis MHA Asthma  Past Surgical History: Reviewed history from 07/08/2006 and no changes required. BTL  Family History: Reviewed history from 06/16/2008 and no changes required. father died of a stroke in Jun 18, 1998 mother died of TB.  A brother has a pacemaker a sister with hypertension Family History Breast cancer 1st degree relative <50  Social History: Reviewed history from 06/18/2007 and no changes required. Retired Married Never Smoked Alcohol use-no Drug use-no Regular exercise-yes  Review of  Systems      See HPI  Physical Exam  General:  Well-developed,well-nourished,in no acute distress; alert,appropriate and cooperative throughout examination Heart:  120/80 right arm sitting position   Impression & Recommendations:  Problem # 1:  HYPERTENSION (ICD-401.9) Assessment Improved  Her updated medication list for this problem includes:    Nadolol 20 Mg Tabs (Nadolol) ..... Once daily    Hydrochlorothiazide 25 Mg Tabs (Hydrochlorothiazide) .Marland Kitchen... Take 1 tablet by mouth every morning  Complete Medication List: 1)  Nadolol 20 Mg Tabs (Nadolol) .... Once daily 2)  Hydrochlorothiazide 25 Mg Tabs (Hydrochlorothiazide) .... Take 1 tablet by mouth every morning  Patient Instructions: 1)  continue current blood pressure medications. 2)  Return in 2022-06-18 for your annual exam. 3)  Motrin 400 mg twice daily with food. 4)  BMP prior to visit, ICD-9: 5)  Hepatic Panel prior to visit, ICD-9:...Marland KitchenMarland Kitchen401.9 6)  Lipid Panel prior to visit, ICD-9: 7)  TSH prior to visit, ICD-9: 8)  CBC w/ Diff prior to visit, ICD-9: 9)  Urine-dip prior to visit, ICD-9:   Orders Added: 1)  Est. Patient Level III [16109]

## 2010-04-20 ENCOUNTER — Other Ambulatory Visit: Payer: Self-pay | Admitting: Family Medicine

## 2010-04-20 DIAGNOSIS — Z1231 Encounter for screening mammogram for malignant neoplasm of breast: Secondary | ICD-10-CM

## 2010-05-23 ENCOUNTER — Ambulatory Visit
Admission: RE | Admit: 2010-05-23 | Discharge: 2010-05-23 | Disposition: A | Discharge status: Home / Self Care | Payer: Federal, State, Local not specified - PPO | Source: Ambulatory Visit | Attending: Family Medicine | Admitting: Family Medicine

## 2010-05-23 DIAGNOSIS — Z1231 Encounter for screening mammogram for malignant neoplasm of breast: Secondary | ICD-10-CM

## 2010-06-13 ENCOUNTER — Ambulatory Visit (INDEPENDENT_AMBULATORY_CARE_PROVIDER_SITE_OTHER): Payer: Federal, State, Local not specified - PPO | Admitting: Family Medicine

## 2010-06-13 DIAGNOSIS — Z0000 Encounter for general adult medical examination without abnormal findings: Secondary | ICD-10-CM

## 2010-06-15 ENCOUNTER — Other Ambulatory Visit (INDEPENDENT_AMBULATORY_CARE_PROVIDER_SITE_OTHER): Payer: Federal, State, Local not specified - PPO | Admitting: Family Medicine

## 2010-06-15 DIAGNOSIS — Z0000 Encounter for general adult medical examination without abnormal findings: Secondary | ICD-10-CM

## 2010-06-15 LAB — HEPATIC FUNCTION PANEL
ALT: 20 U/L (ref 0–35)
AST: 21 U/L (ref 0–37)
Albumin: 3.2 g/dL — ABNORMAL LOW (ref 3.5–5.2)
Bilirubin, Direct: 0 mg/dL (ref 0.0–0.3)
Total Bilirubin: 0.4 mg/dL (ref 0.3–1.2)

## 2010-06-15 LAB — CBC WITH DIFFERENTIAL/PLATELET
Basophils Absolute: 0 10*3/uL (ref 0.0–0.1)
Basophils Relative: 0.5 % (ref 0.0–3.0)
Hemoglobin: 11.6 g/dL — ABNORMAL LOW (ref 12.0–15.0)
Lymphocytes Relative: 36.2 % (ref 12.0–46.0)
Neutro Abs: 3.2 10*3/uL (ref 1.4–7.7)
Neutrophils Relative %: 53 % (ref 43.0–77.0)
Platelets: 341 10*3/uL (ref 150.0–400.0)
RBC: 4.22 Mil/uL (ref 3.87–5.11)
WBC: 6 10*3/uL (ref 4.5–10.5)

## 2010-06-15 LAB — POCT URINALYSIS DIPSTICK
Blood, UA: NEGATIVE
Ketones, UA: NEGATIVE
Protein, UA: NEGATIVE
Spec Grav, UA: 1.03
Urobilinogen, UA: 0.2

## 2010-06-15 LAB — BASIC METABOLIC PANEL
GFR: 76.52 mL/min (ref >60.00)
Glucose, Bld: 84 mg/dL (ref 70–99)
Potassium: 5.1 mEq/L (ref 3.5–5.1)

## 2010-06-15 LAB — TB SKIN TEST
Induration: 0
TB Skin Test: NEGATIVE mm

## 2010-06-15 LAB — LIPID PANEL
Cholesterol: 147 mg/dL (ref 0–200)
HDL: 71.8 mg/dL (ref >39.00)

## 2010-06-22 ENCOUNTER — Ambulatory Visit: Payer: Self-pay | Admitting: Family Medicine

## 2010-06-25 ENCOUNTER — Other Ambulatory Visit: Payer: Self-pay | Admitting: Family Medicine

## 2010-06-28 ENCOUNTER — Encounter: Payer: Self-pay | Admitting: Family Medicine

## 2010-06-28 ENCOUNTER — Ambulatory Visit (INDEPENDENT_AMBULATORY_CARE_PROVIDER_SITE_OTHER): Payer: Federal, State, Local not specified - PPO | Admitting: Family Medicine

## 2010-06-28 VITALS — BP 110/70 | Temp 98.0°F | Ht 62.75 in | Wt 126.0 lb

## 2010-06-28 DIAGNOSIS — D6489 Other specified anemias: Secondary | ICD-10-CM

## 2010-06-28 DIAGNOSIS — I10 Essential (primary) hypertension: Secondary | ICD-10-CM

## 2010-06-28 MED ORDER — HYDROCHLOROTHIAZIDE 25 MG PO TABS: 25.0000 mg | ORAL_TABLET | ORAL | Status: DC

## 2010-06-28 MED ORDER — NADOLOL 20 MG PO TABS: 20.0000 mg | ORAL_TABLET | Freq: Every day | ORAL | Status: DC

## 2010-06-28 NOTE — Progress Notes (Signed)
  Subjective:    Patient ID: Kayla Key, female    DOB: 1945/08/27, 65 y.o.   MRN: [id number]  HPI Kayla Key is a delightful, 65 year old, married female, nonsmoker with comes in today for general physical examination because of a history of anemia, etiology unknown, hypertension, and a new problem of pain in her right shoulder.  She takes hydrochlorothiazide 25 mg daily and Corgard 20 daily for hypertension.  BP 110/70.  She has a history of low-grade anemia, etiology unknown, hemoglobin stable at 11.6.  Now she is having soreness and stiffness and inability to move her right shoulder.  It started about two months.  No history of trauma.  It's getting worse.  Referred to Dr. Cleophas Dunker for evaluation.  She gets routine eye care, dental care, BSE monthly, annual mammography, colonoscopy, 2007 normal mammogram April 2012 normal.  Tetanus 2006 consider shingles.  Vaccination   Review of Systems  Constitutional: Negative.   HENT: Negative.   Eyes: Negative.   Respiratory: Negative.   Cardiovascular: Negative.   Gastrointestinal: Negative.   Genitourinary: Negative.   Musculoskeletal: Negative.   Neurological: Negative.   Hematological: Negative.   Psychiatric/Behavioral: Negative.        Objective:   Physical Exam  Constitutional: She appears well-developed and well-nourished.  HENT:  Head: Normocephalic and atraumatic.  Right Ear: External ear normal.  Left Ear: External ear normal.  Nose: Nose normal.  Mouth/Throat: Oropharynx is clear and moist.  Eyes: EOM are normal. Pupils are equal, round, and reactive to light.  Neck: Normal range of motion. Neck supple. No thyromegaly present.  Cardiovascular: Normal rate, regular rhythm, normal heart sounds and intact distal pulses.  Exam reveals no gallop and no friction rub.   No murmur heard. Pulmonary/Chest: Effort normal and breath sounds normal.  Abdominal: Soft. Bowel sounds are normal. She exhibits no distension and no  mass. There is no tenderness. There is no rebound.  Genitourinary: Vagina normal and uterus normal. Guaiac negative stool. No vaginal discharge found.  Musculoskeletal: Normal range of motion.  Lymphadenopathy:    She has no cervical adenopathy.  Neurological: She is alert. She has normal reflexes. No cranial nerve deficit. She exhibits normal muscle tone. Coordination normal.  Skin: Skin is warm and dry.       Dark lesion, right in the midline under the umbilicus, and advised to return ASAP for removal  Psychiatric: She has a normal mood and affect. Her behavior is normal. Judgment and thought content normal.          Assessment & Plan:  Hypertension continue above medication.  History of low-grade anemia, etiology unknown.  Right shoulder pain.  Refer to Dr. Cleophas Dunker.  Abnormal-looking mole.  Return for removal ASAP

## 2010-06-28 NOTE — Patient Instructions (Signed)
Continue your current medications.  Call Dr. Norlene Campbell for evaluation of the right shoulder.  Return sometime in the next couple weeks for removal of the lesion.  On your abdomen

## 2010-07-03 ENCOUNTER — Ambulatory Visit: Payer: Federal, State, Local not specified - PPO | Admitting: Family Medicine

## 2010-12-31 ENCOUNTER — Ambulatory Visit (INDEPENDENT_AMBULATORY_CARE_PROVIDER_SITE_OTHER): Payer: Federal, State, Local not specified - PPO | Admitting: Family Medicine

## 2010-12-31 ENCOUNTER — Encounter: Payer: Self-pay | Admitting: Family Medicine

## 2010-12-31 VITALS — BP 100/72 | Temp 98.2°F | Wt 132.0 lb

## 2010-12-31 DIAGNOSIS — J45909 Unspecified asthma, uncomplicated: Secondary | ICD-10-CM

## 2010-12-31 MED ORDER — PREDNISONE 20 MG PO TABS: ORAL_TABLET | ORAL | Status: DC

## 2010-12-31 NOTE — Patient Instructions (Signed)
Take the prednisone as directed.  Return p.r.n. 

## 2010-12-31 NOTE — Progress Notes (Signed)
  Subjective:    Patient ID: Kayla Key, female    DOB: 06/11/1945, 65 y.o.   MRN: [id number]  HPIMarilyn is a 65 year old female, married, nonsmoker comes in today for flare of her allergic rhinitis and asthma.  Her symptoms typically are spring and fall.  Two weeks ago she began having sneezing hay, congestion, and now is wheezing.  She describes the wheezing is mild and intermittent.  She is able to sleep without difficulty    Review of Systems    General ENT and pulmonary venous systems otherwise negative Objective:   Physical Exam  Well-developed well-nourished, female, in no acute distress.  HEENT were negative, except for 4+ nasal edema.  Neck is supple.  No adenopathy.  Lungs show symmetrical.  Breath sounds very mild symmetrical late expiratory wheezing      Assessment & Plan:  Allergic rhinitis and asthma flare.  Plan prednisone burst and taper

## 2011-01-08 ENCOUNTER — Ambulatory Visit: Payer: Federal, State, Local not specified - PPO | Admitting: Family Medicine

## 2011-01-11 ENCOUNTER — Ambulatory Visit (INDEPENDENT_AMBULATORY_CARE_PROVIDER_SITE_OTHER): Payer: Federal, State, Local not specified - PPO | Admitting: Family Medicine

## 2011-01-11 ENCOUNTER — Encounter: Payer: Self-pay | Admitting: Family Medicine

## 2011-01-11 VITALS — BP 102/72 | Temp 98.2°F | Wt 135.0 lb

## 2011-01-11 DIAGNOSIS — R0789 Other chest pain: Secondary | ICD-10-CM

## 2011-01-11 MED ORDER — TRAMADOL HCL 50 MG PO TABS: ORAL_TABLET | ORAL | Status: DC

## 2011-01-11 NOTE — Patient Instructions (Signed)
Take Motrin 600 mg twice daily with food and tramadol one half to one tablet every 4 to 6 hours as needed for pain

## 2011-01-11 NOTE — Progress Notes (Signed)
  Subjective:    Patient ID: Kayla Key, female    DOB: [birth year], 65 y.o.   MRN: [id number]  HPI Kayla Key is a 65 year old female, nonsmoker, who comes in today for evaluation of chest pain.  She has had a viral syndrome for the last couple weeks and has been doing a lot of coughing.  The cough now is almost gone.  However, yesterday, she developed a sharp left upper anterior chest wall pain.  She, says it's sharp it comes and goes.  Review of systems otherwise negative   Review of Systems    General and pulmonary review of systems otherwise negative Objective:   Physical Exam  Well-developed well-nourished, female, in no acute distress.  Examination lungs are clear.  There is some palpable tenderness left upper anterior chest wall.  Cardiac exam normal      Assessment & Plan:  Chest wall pain..........Marland Kitchen Motrin 600 mg twice daily with food, and tramadol 50 mg p.r.n.

## 2011-04-15 ENCOUNTER — Other Ambulatory Visit: Payer: Self-pay | Admitting: Family Medicine

## 2011-04-15 DIAGNOSIS — Z1231 Encounter for screening mammogram for malignant neoplasm of breast: Secondary | ICD-10-CM

## 2011-05-24 ENCOUNTER — Ambulatory Visit
Admission: RE | Admit: 2011-05-24 | Discharge: 2011-05-24 | Disposition: A | Discharge status: Home / Self Care | Payer: Federal, State, Local not specified - PPO | Source: Ambulatory Visit | Attending: Family Medicine | Admitting: Family Medicine

## 2011-05-24 DIAGNOSIS — Z1231 Encounter for screening mammogram for malignant neoplasm of breast: Secondary | ICD-10-CM

## 2011-05-25 ENCOUNTER — Other Ambulatory Visit: Payer: Self-pay | Admitting: Family Medicine

## 2011-07-03 ENCOUNTER — Encounter: Payer: Self-pay | Admitting: Family Medicine

## 2011-07-03 ENCOUNTER — Other Ambulatory Visit (HOSPITAL_COMMUNITY)
Admission: RE | Admit: 2011-07-03 | Discharge: 2011-07-03 | Disposition: A | Discharge status: Home / Self Care | Payer: Federal, State, Local not specified - PPO | Source: Ambulatory Visit | Attending: Family Medicine | Admitting: Family Medicine

## 2011-07-03 ENCOUNTER — Ambulatory Visit (INDEPENDENT_AMBULATORY_CARE_PROVIDER_SITE_OTHER): Payer: Federal, State, Local not specified - PPO | Admitting: Family Medicine

## 2011-07-03 VITALS — BP 110/80 | Temp 97.9°F | Ht 62.5 in | Wt 134.0 lb

## 2011-07-03 DIAGNOSIS — I10 Essential (primary) hypertension: Secondary | ICD-10-CM

## 2011-07-03 DIAGNOSIS — Z01419 Encounter for gynecological examination (general) (routine) without abnormal findings: Secondary | ICD-10-CM | POA: Insufficient documentation

## 2011-07-03 DIAGNOSIS — Z0000 Encounter for general adult medical examination without abnormal findings: Secondary | ICD-10-CM

## 2011-07-03 DIAGNOSIS — R0789 Other chest pain: Secondary | ICD-10-CM

## 2011-07-03 LAB — CBC WITH DIFFERENTIAL/PLATELET
Basophils Absolute: 0.1 10*3/uL (ref 0.0–0.1)
Eosinophils Absolute: 0.1 10*3/uL (ref 0.0–0.7)
Lymphocytes Relative: 34.6 % (ref 12.0–46.0)
MCHC: 32 g/dL (ref 30.0–36.0)
Neutrophils Relative %: 52.7 % (ref 43.0–77.0)
RDW: 14.3 % (ref 11.5–14.6)

## 2011-07-03 LAB — POCT URINALYSIS DIPSTICK
Glucose, UA: NEGATIVE
Nitrite, UA: NEGATIVE
Urobilinogen, UA: 0.2

## 2011-07-03 LAB — TSH: TSH: 0.81 u[IU]/mL (ref 0.35–5.50)

## 2011-07-03 LAB — BASIC METABOLIC PANEL
Calcium: 8.8 mg/dL (ref 8.4–10.5)
GFR: 97.18 mL/min (ref >60.00)
Glucose, Bld: 69 mg/dL — ABNORMAL LOW (ref 70–99)
Sodium: 140 mEq/L (ref 135–145)

## 2011-07-03 MED ORDER — ESTRADIOL 0.1 MG/GM VA CREA: 2.0000 g | TOPICAL_CREAM | Freq: Every day | VAGINAL | Status: AC

## 2011-07-03 MED ORDER — NADOLOL 20 MG PO TABS: 20.0000 mg | ORAL_TABLET | Freq: Every day | ORAL | Status: DC

## 2011-07-03 MED ORDER — TRAMADOL HCL 50 MG PO TABS: ORAL_TABLET | ORAL | Status: DC

## 2011-07-03 MED ORDER — HYDROCHLOROTHIAZIDE 25 MG PO TABS: 25.0000 mg | ORAL_TABLET | Freq: Every day | ORAL | Status: DC

## 2011-07-03 NOTE — Patient Instructions (Signed)
Continue your current medications  Take 600 mg of Motrin twice daily for your low back pain and tramadol one half to one tablet at bedtime when necessary  Continue your walking program  Use small amounts of a hormonal cream twice weekly  Return in one year sooner if any problems

## 2011-07-03 NOTE — Progress Notes (Signed)
  Subjective:    Patient ID: Kayla Key, female    DOB: Jan 07, 1946, 66 y.o.   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 She takes Motrin during the day. Her pain hasn't been unchanged over the past couple years. She says it's in her lumbar area. It's worse in the morning when she gets up and walks around it diminishes but it never goes away.  She gets routine eye care, dental care, BSE monthly, and you mammography, colonoscopy 7 years ago normal, tetanus 2008, information given on shingles  Also complaining of vaginal dryness   Review of Systems  Constitutional: Negative.   HENT: Negative.   Eyes: Negative.   Respiratory: Negative.   Cardiovascular: Negative.   Gastrointestinal: Negative.   Genitourinary: Negative.   Musculoskeletal: Negative.   Neurological: Negative.   Hematological: Negative.   Psychiatric/Behavioral: Negative.        Objective:   Physical Exam  Constitutional: She appears well-developed and well-nourished.  HENT:  Head: Normocephalic and atraumatic.  Right Ear: External ear normal.  Left Ear: External ear normal.  Nose: Nose normal.  Mouth/Throat: Oropharynx is clear and moist.  Eyes: EOM are normal. Pupils are equal, round, and reactive to light.  Neck: Normal range of motion. Neck supple. No thyromegaly present.  Cardiovascular: Normal rate, regular rhythm, normal heart sounds and intact distal pulses.  Exam reveals no gallop and no friction rub.   No murmur heard. Pulmonary/Chest: Effort normal and breath sounds normal.  Abdominal: Soft. Bowel sounds are normal. She exhibits no distension and no  mass. There is no tenderness. There is no rebound.  Genitourinary: Vagina normal and uterus normal. Guaiac negative stool. No vaginal discharge found.       Bilateral breast exam normal extremely vaginal dryness  Musculoskeletal: Normal range of motion.  Lymphadenopathy:    She has no cervical adenopathy.  Neurological: She is alert. She has normal reflexes. No cranial nerve deficit. She exhibits normal muscle tone. Coordination normal.  Skin: Skin is warm and dry.  Psychiatric: She has a normal mood and affect. Her behavior is normal. Judgment and thought content normal.          Assessment & Plan:  Healthy female  Hypertension continue Corgard and hydrochlorothiazide  Osteoarthritis low back area continue Motrin 600 twice a day tramadol one half tablet each bedtime  Postmenopausal vaginal dryness Estrace cream when necessary twice weekly

## 2011-07-03 NOTE — Progress Notes (Signed)
Addended by: Kern Reap B on: 07/03/2011 11:15 AM   Modules accepted: Orders

## 2012-03-16 ENCOUNTER — Telehealth: Payer: Self-pay | Admitting: *Deleted

## 2012-03-16 NOTE — Telephone Encounter (Signed)
Patient needs a note for the department of social services stating that she has had a right rotator cuff tear since 2012.  She states that she has had a MRI.  Is this okay to write?

## 2012-03-16 NOTE — Telephone Encounter (Signed)
Fleet Contras please call ,,,,,,,,,,,, who ordered the MRI,,,,,,,,,, that person would be the one to write the note

## 2012-03-18 NOTE — Telephone Encounter (Signed)
Spoke with patient.

## 2012-05-18 ENCOUNTER — Other Ambulatory Visit: Payer: Self-pay

## 2012-05-18 DIAGNOSIS — Z1231 Encounter for screening mammogram for malignant neoplasm of breast: Secondary | ICD-10-CM

## 2012-06-17 ENCOUNTER — Ambulatory Visit
Admission: RE | Admit: 2012-06-17 | Discharge: 2012-06-17 | Disposition: A | Discharge status: Home / Self Care | Payer: Federal, State, Local not specified - PPO | Source: Ambulatory Visit

## 2012-06-17 DIAGNOSIS — Z1231 Encounter for screening mammogram for malignant neoplasm of breast: Secondary | ICD-10-CM

## 2012-08-05 ENCOUNTER — Other Ambulatory Visit: Payer: Self-pay | Admitting: Family Medicine

## 2012-09-10 ENCOUNTER — Other Ambulatory Visit: Payer: Self-pay | Admitting: Family Medicine

## 2012-09-24 ENCOUNTER — Other Ambulatory Visit (INDEPENDENT_AMBULATORY_CARE_PROVIDER_SITE_OTHER): Payer: Federal, State, Local not specified - PPO

## 2012-09-24 DIAGNOSIS — Z0000 Encounter for general adult medical examination without abnormal findings: Secondary | ICD-10-CM

## 2012-09-24 LAB — CBC WITH DIFFERENTIAL/PLATELET
Basophils Absolute: 0 10*3/uL (ref 0.0–0.1)
Hemoglobin: 11.7 g/dL — ABNORMAL LOW (ref 12.0–15.0)
Lymphocytes Relative: 37.9 % (ref 12.0–46.0)
Monocytes Relative: 8.9 % (ref 3.0–12.0)
Neutro Abs: 2.8 10*3/uL (ref 1.4–7.7)
RBC: 4.34 Mil/uL (ref 3.87–5.11)
RDW: 14.1 % (ref 11.5–14.6)

## 2012-09-24 LAB — POCT URINALYSIS DIPSTICK
Blood, UA: NEGATIVE
Nitrite, UA: NEGATIVE
Protein, UA: NEGATIVE
Urobilinogen, UA: 1
pH, UA: 7

## 2012-09-24 LAB — BASIC METABOLIC PANEL
CO2: 28 mEq/L (ref 19–32)
Chloride: 104 mEq/L (ref 96–112)
Creatinine, Ser: 0.7 mg/dL (ref 0.4–1.2)
Sodium: 138 mEq/L (ref 135–145)

## 2012-09-24 LAB — HEPATIC FUNCTION PANEL
ALT: 21 U/L (ref 0–35)
Total Protein: 7.1 g/dL (ref 6.0–8.3)

## 2012-09-24 LAB — LIPID PANEL
HDL: 82.7 mg/dL (ref >39.00)
LDL Cholesterol: 85 mg/dL (ref 0–99)
Total CHOL/HDL Ratio: 2
Triglycerides: 37 mg/dL (ref 0.0–149.0)
VLDL: 7.4 mg/dL (ref 0.0–40.0)

## 2012-10-01 ENCOUNTER — Encounter: Payer: Federal, State, Local not specified - PPO | Admitting: Family Medicine

## 2012-10-01 ENCOUNTER — Ambulatory Visit (INDEPENDENT_AMBULATORY_CARE_PROVIDER_SITE_OTHER): Payer: Federal, State, Local not specified - PPO | Admitting: Family

## 2012-10-01 ENCOUNTER — Encounter: Payer: Self-pay | Admitting: Family

## 2012-10-01 VITALS — BP 100/62 | HR 63 | Ht 62.0 in | Wt 134.0 lb

## 2012-10-01 DIAGNOSIS — Z0000 Encounter for general adult medical examination without abnormal findings: Secondary | ICD-10-CM

## 2012-10-01 DIAGNOSIS — I10 Essential (primary) hypertension: Secondary | ICD-10-CM

## 2012-10-01 DIAGNOSIS — Z23 Encounter for immunization: Secondary | ICD-10-CM

## 2012-10-01 NOTE — Patient Instructions (Addendum)
Breast Self-Examination You should begin examining your breasts at age 65 even though the risk for breast cancer is low in this age group. It is important to become familiar with how your breasts look and feel. This is true for pregnant women, nursing mothers, women in menopause and women who have breast implants.  Women should examine their breasts once a month to look for changes and lumps. By doing monthly breast exams, you get to know how your breasts feel and how they can change from month to month. This allows you to pick up changes early. It can also offer you some reassurance that your breast health is good. This exam only takes minutes. Most breast lumps are not caused by cancer. If you find a lump, a special x-ray called a mammogram, or other tests may be needed to determine what is wrong.  Some of the signs that a breast lump is caused by cancer include:  Dimpling of the skin or changes in the shape of the breast or nipple.   A dark-colored or bloody discharge from the nipple.   Swollen lymph glands around the breast or in the armpit.   Redness of the breast or nipple.   Scaly nipple or skin on the breast.   Pain or swelling of the breast.  SELF-EXAM There are a few points to follow when doing a thorough breast exam. The best time to examine your breasts is 5 to 7 days after the menstrual period is over. During menstruation, the breasts are lumpier, and it may be more difficult to pick up changes. If you do not menstruate, have reached menopause or had a hysterectomy, examine your breasts the first day of every month. After three to four months, you will become more familiar with the variations of your breasts and more comfortable with the exam.  Perform your breast exam monthly. Keep a written record with breast changes or normal findings for each breast. This makes it easier to be sure of changes and to not solely depend on memory for size, tenderness, or location. Try to do the exam  at the same time each month, and write down where you are in your menstrual cycle if you are still menstruating.   Look at your breasts. Stand in front of a mirror with your hands clasped behind your head. Tighten your chest muscles and look for asymmetry. This means a difference in shape or contour from one breast to the other, such as puckers, dips or bumps. Look also for skin changes.   Lean forward with your hands on your hips. Again, look for symmetry and skin changes.   While showering, soap the breasts, and carefully feel the breasts with fingertips while holding the arm (on the side of the breast being examined) over the head. Do this with each breast carefully feeling for lumps or changes. Typically, a circular motion with moderate fingertip pressure should be used.   Repeat this exam while lying on your back, again with your arm behind your head and a pillow under your shoulders. Again, use your fingertips to examine both breasts, feeling for lumps and thickening. Begin at 1 o'clock and go clockwise around the whole breast.   At the end of your exam, gently squeeze each nipple to see if there is any drainage. Look for nipple changes, dimpling or redness.   Lastly, examine the upper chest and clavicle areas and in your armpits.  It is not necessary to become alarmed if you find   a breast lump. Most of them are not cancerous. However, it is necessary to see your caregiver if a lump is found in order to have it looked at. Document Released: 02/22/2004 Document Revised: 09/26/2010 Document Reviewed: 05/03/2008 [employer] Patient Information 2012 [employer]. 

## 2012-10-01 NOTE — Progress Notes (Signed)
Subjective:    Patient ID: Kayla Key, female    DOB: 09-05-1945, 67 y.o.   MRN: [id number]  HPI 67 year old AAF, patient of Dr. Tawanna Cooler is in for a routine physical examination for this healthy  Female. Reviewed all health maintenance protocols including mammography colonoscopy bone density and reviewed appropriate screening labs. Her immunization history was reviewed as well as her current medications and allergies refills of her chronic medications were given and the plan for yearly health maintenance was discussed all orders and referrals were made as appropriate.   Review of Systems  Constitutional: Negative.   HENT: Negative.   Eyes: Negative.   Respiratory: Negative.   Cardiovascular: Negative.   Gastrointestinal: Negative.   Endocrine: Negative.   Genitourinary: Negative.   Musculoskeletal: Negative.   Skin: Negative.   Allergic/Immunologic: Negative.   Neurological: Negative.   Hematological: Negative.   Psychiatric/Behavioral: Negative.    Past Medical History  Diagnosis Date  . Hypertension   . Osteoporosis   . MHA (microangiopathic hemolytic anemia)   . Asthma     History   Social History  . Marital Status: Widowed    Spouse Name: N/A    Number of Children: N/A  . Years of Education: N/A   Occupational History  . Not on file.   Social History Main Topics  . Smoking status: Never Smoker   . Smokeless tobacco: Not on file  . Alcohol Use:   . Drug Use:   . Sexual Activity:    Other Topics Concern  . Not on file   Social History Narrative  . No narrative on file    Past Surgical History  Procedure Laterality Date  . Btl      Family History  Problem Relation Age of Onset  . Stroke Father   . Hypertension Sister   . Cancer Other     breast     No Known Allergies  Current Outpatient Prescriptions on File Prior to Visit  Medication Sig Dispense Refill  . hydrochlorothiazide (HYDRODIURIL) 25 MG tablet TAKE 1 TABLET (25 MG TOTAL) BY  MOUTH DAILY.  100 tablet  0  . nadolol (CORGARD) 20 MG tablet TAKE 1 TABLET (20 MG TOTAL) BY MOUTH DAILY.  90 tablet  0  . traMADol (ULTRAM) 50 MG tablet One half to one tablet 4 times daily p.r.n. Pain.Marland KitchenMarland KitchenMaximum dose= 8 tablets per day  100 tablet  2  . predniSONE (DELTASONE) 20 MG tablet Two tabs x 3 days, one x 3 days, a half a tab x 3 days, then half a tablet Monday, Wednesday, Friday, for a 3-week taper  30 tablet  1   No current facility-administered medications on file prior to visit.    BP 100/62  Pulse 63  Ht 5\' 2"  (1.575 m)  Wt 134 lb (60.782 kg)  BMI 24.5 kg/m2chart    Objective:   Physical Exam  Constitutional: She is oriented to person, place, and time. She appears well-developed and well-nourished.  HENT:  Head: Normocephalic and atraumatic.  Right Ear: External ear normal.  Left Ear: External ear normal.  Nose: Nose normal.  Mouth/Throat: Oropharynx is clear and moist.  Eyes: Conjunctivae and EOM are normal. Pupils are equal, round, and reactive to light.  Neck: Normal range of motion. Neck supple. No thyromegaly present.  Cardiovascular: Normal rate, regular rhythm and normal heart sounds.   Pulmonary/Chest: Effort normal and breath sounds normal.  Abdominal: Soft. Bowel sounds are normal. She exhibits no distension. There  is no tenderness. There is no rebound and no guarding.  Musculoskeletal: Normal range of motion.  Neurological: She is alert and oriented to person, place, and time. She has normal reflexes. She displays normal reflexes. No cranial nerve deficit. Coordination normal.  Skin: Skin is warm and dry.  Psychiatric: She has a normal mood and affect.          Assessment & Plan:  : 1. Complete physical exam 2. Hypertension  Plan: Continue current medications. Her sodium diet, exercise, monthly self breast exams. Recheck patient in 6 months and sooner as needed.

## 2012-11-30 ENCOUNTER — Other Ambulatory Visit: Payer: Self-pay | Admitting: Family Medicine

## 2012-12-21 ENCOUNTER — Ambulatory Visit: Payer: Federal, State, Local not specified - PPO | Admitting: Family

## 2012-12-22 ENCOUNTER — Ambulatory Visit: Payer: Federal, State, Local not specified - PPO | Admitting: Family Medicine

## 2012-12-29 ENCOUNTER — Ambulatory Visit: Payer: Federal, State, Local not specified - PPO | Admitting: Family

## 2012-12-29 ENCOUNTER — Encounter: Payer: Self-pay | Admitting: Family Medicine

## 2012-12-29 ENCOUNTER — Ambulatory Visit (INDEPENDENT_AMBULATORY_CARE_PROVIDER_SITE_OTHER): Payer: Medicare Other | Admitting: Family Medicine

## 2012-12-29 ENCOUNTER — Ambulatory Visit: Payer: Federal, State, Local not specified - PPO | Admitting: Family Medicine

## 2012-12-29 VITALS — BP 120/80 | Temp 97.9°F | Wt 140.0 lb

## 2012-12-29 DIAGNOSIS — M13162 Monoarthritis, not elsewhere classified, left knee: Secondary | ICD-10-CM

## 2012-12-29 MED ORDER — NAPROXEN 500 MG PO TABS: 500.0000 mg | ORAL_TABLET | Freq: Two times a day (BID) | ORAL | Status: DC

## 2012-12-29 NOTE — Progress Notes (Signed)
Pre visit review using our clinic review tool, if applicable. No additional management support is needed unless otherwise documented below in the visit note. 

## 2012-12-29 NOTE — Progress Notes (Signed)
No chief complaint on file.   HPI:  Acute visit for knee pain: -started 2 weeks ago, L knee after doing a lot of stairs and more walking then usually -pain is moderate and constant and achy -better with rest and with aleve -worse with lots of walking and stairs -denies: fevers, chills, redness, clicking, popping, giving out ROS: See pertinent positives and negatives per HPI.  Past Medical History  Diagnosis Date  . Hypertension   . Osteoporosis   . MHA (microangiopathic hemolytic anemia)   . Asthma     Past Surgical History  Procedure Laterality Date  . Btl      Family History  Problem Relation Age of Onset  . Stroke Father   . Hypertension Sister   . Cancer Other     breast     History   Social History  . Marital Status: Widowed    Spouse Name: N/A    Number of Children: N/A  . Years of Education: N/A   Social History Main Topics  . Smoking status: Never Smoker   . Smokeless tobacco: None  . Alcohol Use:   . Drug Use:   . Sexual Activity:    Other Topics Concern  . None   Social History Narrative  . None    Current outpatient prescriptions:hydrochlorothiazide (HYDRODIURIL) 25 MG tablet, TAKE 1 TABLET (25 MG TOTAL) BY MOUTH DAILY., Disp: 100 tablet, Rfl: 3;  nadolol (CORGARD) 20 MG tablet, TAKE 1 TABLET (20 MG TOTAL) BY MOUTH DAILY., Disp: 90 tablet, Rfl: 3;  traMADol (ULTRAM) 50 MG tablet, One half to one tablet 4 times daily p.r.n. Pain.Marland KitchenMarland KitchenMaximum dose= 8 tablets per day, Disp: 100 tablet, Rfl: 2 naproxen (NAPROSYN) 500 MG tablet, Take 1 tablet (500 mg total) by mouth 2 (two) times daily with a meal., Disp: 20 tablet, Rfl: 0  EXAM:  Filed Vitals:   12/29/12 1601  BP: 120/80  Temp: 97.9 F (36.6 C)    Body mass index is 25.6 kg/(m^2).  GENERAL: vitals reviewed and listed above, alert, oriented, appears well hydrated and in no acute distress  HEENT: atraumatic, conjunttiva clear, no obvious abnormalities on inspection of external nose and  ears  NECK: no obvious masses on inspection  MS: moves all extremities without noticeable abnormality -normal gait -on inspection perhaps small effusion, no erythema or warmth -J sign, + single leg squat, petellar crepitus, weak VMO -TTP around knee cap -neg lachman's, neg ant/post drawer, neg mcmurry, nv intact distallly  PSYCH: pleasant and cooperative, no obvious depression or anxiety  ASSESSMENT AND PLAN:  Discussed the following assessment and plan:  Patellofemoral arthritis, left - Plan: naproxen (NAPROSYN) 500 MG tablet  -likely PFS and OA per exam findings - discussed this and possiblity of other etiologies with pt -decided to do trial of HEP and naproxen, risks discussed -follow up in 2-4 weeks or sooner if worsening  -Patient advised to return or notify a doctor immediately if symptoms worsen or persist or new concerns arise.  There are no Patient Instructions on file for this visit.   Kriste Basque R.

## 2012-12-30 ENCOUNTER — Ambulatory Visit: Payer: Federal, State, Local not specified - PPO | Admitting: Family Medicine

## 2013-01-28 HISTORY — PX: KNEE ARTHROSCOPY: SUR90

## 2013-02-08 ENCOUNTER — Encounter: Payer: Self-pay | Admitting: Family Medicine

## 2013-02-08 ENCOUNTER — Ambulatory Visit (INDEPENDENT_AMBULATORY_CARE_PROVIDER_SITE_OTHER): Payer: Medicare Other | Admitting: Family Medicine

## 2013-02-08 VITALS — Temp 97.8°F | Wt 138.0 lb

## 2013-02-08 DIAGNOSIS — M25569 Pain in unspecified knee: Secondary | ICD-10-CM | POA: Diagnosis not present

## 2013-02-08 DIAGNOSIS — M25562 Pain in left knee: Secondary | ICD-10-CM | POA: Insufficient documentation

## 2013-02-08 NOTE — Progress Notes (Signed)
   Subjective:    Patient ID: Kayla Key, female    DOB: 1945/10/05, 68 y.o.   MRN: [id number]  HPI Kayla Key is a 68 year old female who comes in today for continuation of pain in her left knee for about 6 weeks  She had surgery on her right knee in 2009 because of a torn cartilage and a resistant effusion. Her knees are doing well until recently when she noticed soreness in her left knee in an effusion with no history of trauma. She was seen here and given Naprosyn 500 twice a day and comes in today for followup. She states she effusion somewhat better but she still having a lot of pain. She points to the medial portion of her left knee as a source of her discomfort  No history of trauma   Review of Systems    review of systems otherwise negative Objective:   Physical Exam Well-developed well-nourished female no acute distress examination left knee shows a slight effusion ligaments are intact there is tenderness along the medial joint line.       Assessment & Plan:  Pain medial side left knee with effusion question another torn cartilage plan orthopedic consult

## 2013-02-08 NOTE — Patient Instructions (Signed)
Elevation and ice at every opportunity  Motrin 600 mg twice daily with food  Call Dr. Norlene CampbellPeter Whitfield and make an appointment to be seen

## 2013-02-18 ENCOUNTER — Ambulatory Visit (INDEPENDENT_AMBULATORY_CARE_PROVIDER_SITE_OTHER): Payer: Medicare Other | Admitting: Family

## 2013-02-18 ENCOUNTER — Encounter: Payer: Self-pay | Admitting: Family

## 2013-02-18 ENCOUNTER — Telehealth: Payer: Self-pay | Admitting: Family Medicine

## 2013-02-18 VITALS — BP 100/80 | HR 62 | Wt 138.0 lb

## 2013-02-18 DIAGNOSIS — H6120 Impacted cerumen, unspecified ear: Secondary | ICD-10-CM

## 2013-02-18 DIAGNOSIS — H109 Unspecified conjunctivitis: Secondary | ICD-10-CM

## 2013-02-18 DIAGNOSIS — I10 Essential (primary) hypertension: Secondary | ICD-10-CM | POA: Diagnosis not present

## 2013-02-18 MED ORDER — POLYMYXIN B-TRIMETHOPRIM 10000-0.1 UNIT/ML-% OP SOLN: 1.0000 [drp] | Freq: Four times a day (QID) | OPHTHALMIC | Status: DC

## 2013-02-18 NOTE — Telephone Encounter (Signed)
Patient Information:  Caller Name: [name]  Phone: [phone]  Patient: Kayla Key, Kayla Key  Gender: Female  DOB: October 27, 1945  Age: 68 Years  PCP: Kelle Dartingodd, [facility](Family Practice)  Office Follow Up:  Does the office need to follow up with this patient?: No  Instructions For The Office: N/A  RN Note:  Mildy itchy eyes.  Left eye appears worse. Was caring for two foster kids with conjunctivitis. Reported cloudy spots on bilateral corneas that do not move after blinking. Denies eye pain.  Needs 45 minutes to get to office.   Symptoms  Reason For Call & Symptoms: Symptoms of bilateral conjunctivitis with crusty material matting eyelashes and redness of sclera.  Reviewed Health History In EMR: Yes  Reviewed Medications In EMR: Yes  Reviewed Allergies In EMR: Yes  Reviewed Surgeries / Procedures: Yes  Date of Onset of Symptoms: 02/17/2013  Guideline(s) Used:  Eye - Pus or Discharge  Disposition Per Guideline:   Go to Office Now  Reason For Disposition Reached:   Cloudy spot or sore seen on the cornea (clear part of the eye)  Advice Given:  Reassurance:  A small amount of pus or mucus in the inner corner of the eye is often due to a cold or virus. Sometimes it's just a reaction to an irritant in the eye.  You can get pink eye from someone who has had it recently.  Pink eye will not harm your eyesight.  Viral conjunctivitis (pink eye) does not need antibiotic treatment. It gets better by itself. Usually it's gone in 2 or 3 days.  Eyelid Cleansing:   Gently wash eyelids and lashes with warm water and wet cotton balls (or cotton gauze). Remove all the dried and liquid pus.  Do this as often as needed.  Contagiousness:  Pinkeye is contagious. It is very easy to spread to other people. You can spread it by shaking hands.  Try not to touch your eyes. Wash your hands often. Do not share towels. Try not to touch your eyes. Wash your hands frequently. Do not share towels.  Call Back If  Pus increases in amount  Pus in corner of eye lasts over 3 days  Eyelid becomes red or swollen  You become worse.  Patient Will Follow Care Advice:  YES  Appointment Scheduled:  02/18/2013 09:30:00 Appointment Scheduled Provider:  Adline Mangoampbell, [facility](Family Practice)

## 2013-02-18 NOTE — Progress Notes (Signed)
   Subjective:    Patient ID: Kayla Key, female    DOB: 04-28-1945, 68 y.o.   MRN: [id number]  HPI 68 year old African American female, patient of Dr. Tawanna Coolerodd today with complaints of appropriate, matting and crusting eyes that have worsened over the last 2 days. She describes them as watery, no itching. Has not taken anything over-the-counter. She reports have an upper sore infection she's been taking over-the-counter medication for.   Review of Systems  Constitutional: Negative.   HENT: Negative.   Eyes: Positive for discharge and redness. Negative for photophobia and visual disturbance.  Respiratory: Negative.   Cardiovascular: Negative.   Skin: Negative.   Allergic/Immunologic: Negative.   Neurological: Negative.   Psychiatric/Behavioral: Negative.    Past Medical History  Diagnosis Date  . Hypertension   . Osteoporosis   . MHA (microangiopathic hemolytic anemia)   . Asthma     History   Social History  . Marital Status: Widowed    Spouse Name: N/A    Number of Children: N/A  . Years of Education: N/A   Occupational History  . Not on file.   Social History Main Topics  . Smoking status: Never Smoker   . Smokeless tobacco: Not on file  . Alcohol Use:   . Drug Use:   . Sexual Activity:    Other Topics Concern  . Not on file   Social History Narrative  . No narrative on file    Past Surgical History  Procedure Laterality Date  . Btl      Family History  Problem Relation Age of Onset  . Stroke Father   . Hypertension Sister   . Cancer Other     breast     No Known Allergies  Current Outpatient Prescriptions on File Prior to Visit  Medication Sig Dispense Refill  . hydrochlorothiazide (HYDRODIURIL) 25 MG tablet TAKE 1 TABLET (25 MG TOTAL) BY MOUTH DAILY.  100 tablet  3  . nadolol (CORGARD) 20 MG tablet TAKE 1 TABLET (20 MG TOTAL) BY MOUTH DAILY.  90 tablet  3  . naproxen (NAPROSYN) 500 MG tablet Take 1 tablet (500 mg total) by mouth 2 (two)  times daily with a meal.  20 tablet  0  . traMADol (ULTRAM) 50 MG tablet One half to one tablet 4 times daily p.r.n. Pain.Marland Kitchen.Marland Kitchen.Maximum dose= 8 tablets per day  100 tablet  2   No current facility-administered medications on file prior to visit.    BP 100/80  Pulse 62  Wt 138 lb (62.596 kg)chart    Objective:   Physical Exam  Constitutional: She is oriented to person, place, and time. She appears well-developed and well-nourished.  HENT:  Nose: Nose normal.  Mouth/Throat: Oropharynx is clear and moist.  Cerumen impaction bilaterally.   Eyes: Right eye exhibits discharge. Left eye exhibits discharge.  Both eyes red, matting and crusting noted  Neck: Normal range of motion. Neck supple.  Cardiovascular: Normal rate, regular rhythm and normal heart sounds.   Pulmonary/Chest: Effort normal and breath sounds normal.  Neurological: She is alert and oriented to person, place, and time.  Skin: Skin is warm and dry.  Psychiatric: She has a normal mood and affect.          Assessment & Plan:  Assessment: 1. Conjunctivitis-bacterial, Polytrim opthalmic drop 1 drop every 6 hours. Handwashing techniques discussed. Call the office if symptoms worsen or persist.  2. Hypertension: Continue current meds.

## 2013-02-18 NOTE — Patient Instructions (Signed)

## 2013-02-18 NOTE — Progress Notes (Signed)
Pre visit review using our clinic review tool, if applicable. No additional management support is needed unless otherwise documented below in the visit note. 

## 2013-03-01 ENCOUNTER — Ambulatory Visit (INDEPENDENT_AMBULATORY_CARE_PROVIDER_SITE_OTHER): Payer: Medicare Other | Admitting: Family Medicine

## 2013-03-01 ENCOUNTER — Encounter: Payer: Self-pay | Admitting: Family Medicine

## 2013-03-01 VITALS — BP 110/80 | Temp 98.3°F | Wt 140.0 lb

## 2013-03-01 DIAGNOSIS — K2100 Gastro-esophageal reflux disease with esophagitis, without bleeding: Secondary | ICD-10-CM | POA: Diagnosis not present

## 2013-03-01 NOTE — Progress Notes (Signed)
   Subjective:    Patient ID: Kayla Key, female    DOB: [birth year], 68 y.o.   MRN: [id number]  HPI Kayla Key is a 68 year old female nonsmoker who comes in today with a month history of a sore throat  She is allergic rhinitis but usually in the spring. The past month she's had a sore throat. If she is worse in the morning. She has no fever earache cough nausea vomiting or diarrhea. She denies any symptoms of reflux.    Review of Systems    review of systems negative Objective:   Physical Exam  Well-developed and nourished female in acute distress vital signs stable she is afebrile ENT exam is normal      Assessment & Plan:  Sore throat x1 month probably reflux treat symptomatically followup in 2 weeks

## 2013-03-01 NOTE — Patient Instructions (Signed)
Prilosec 20 mg OTC........Marland Kitchen. 1 before breakfast and 1 before your evening meal  Ear wax dissolving drops.......... 2 drops in each ear canal bedtime  Return in 2 weeks for followup  Avoid caffeine and peppermint  Continue to drink for 2 hours prior to bedtime  Of sleep on 2 pillows

## 2013-03-02 ENCOUNTER — Ambulatory Visit: Payer: Federal, State, Local not specified - PPO | Admitting: Family Medicine

## 2013-03-03 ENCOUNTER — Telehealth: Payer: Self-pay | Admitting: Family Medicine

## 2013-03-03 NOTE — Telephone Encounter (Signed)
Relevant patient education mailed to patient.  

## 2013-03-12 ENCOUNTER — Encounter: Payer: Self-pay | Admitting: *Deleted

## 2013-03-15 ENCOUNTER — Ambulatory Visit: Payer: Federal, State, Local not specified - PPO | Admitting: Family Medicine

## 2013-03-29 DIAGNOSIS — M1710 Unilateral primary osteoarthritis, unspecified knee: Secondary | ICD-10-CM | POA: Diagnosis not present

## 2013-03-30 ENCOUNTER — Ambulatory Visit: Payer: Federal, State, Local not specified - PPO | Admitting: Family Medicine

## 2013-04-02 DIAGNOSIS — M23329 Other meniscus derangements, posterior horn of medial meniscus, unspecified knee: Secondary | ICD-10-CM | POA: Diagnosis not present

## 2013-04-02 DIAGNOSIS — M6750 Plica syndrome, unspecified knee: Secondary | ICD-10-CM | POA: Diagnosis not present

## 2013-04-06 ENCOUNTER — Ambulatory Visit (INDEPENDENT_AMBULATORY_CARE_PROVIDER_SITE_OTHER): Payer: Federal, State, Local not specified - PPO | Admitting: Family Medicine

## 2013-04-06 ENCOUNTER — Encounter: Payer: Self-pay | Admitting: Family Medicine

## 2013-04-06 VITALS — BP 120/80 | Temp 98.3°F | Wt 142.0 lb

## 2013-04-06 DIAGNOSIS — H6120 Impacted cerumen, unspecified ear: Secondary | ICD-10-CM

## 2013-04-06 DIAGNOSIS — H6123 Impacted cerumen, bilateral: Secondary | ICD-10-CM

## 2013-04-06 NOTE — Patient Instructions (Signed)
Use 2 drops in each ear canal bedtime times one week every 3 months to prevent the impactions in the future

## 2013-04-06 NOTE — Progress Notes (Signed)
   Subjective:    Patient ID: Kayla Key, female    DOB: 18-Dec-1945, 68 y.o.   MRN: [id number]  HPI Kayla Key has a bilateral cerumen impactions. We saw her couple weeks ago she's been using the ear wax building drops at home. She comes in today for move of the are wax  First attempt was irrigation which failed  I then took her to the treatment room in with a combination of irrigation and suction removed bilateral cerumen plugs. Her hearing instantly improved no side effects from procedure   Review of Systems Negative    Objective:   Physical Exam  Bilateral cerumen impactions removed with irrigation and suction by me      Assessment & Plan:  Bilateral show impactions removed UC a right systolic drops every quarter going forward to prevent these impactions in the future

## 2013-04-06 NOTE — Progress Notes (Signed)
Pre visit review using our clinic review tool, if applicable. No additional management support is needed unless otherwise documented below in the visit note. 

## 2013-05-18 ENCOUNTER — Other Ambulatory Visit: Payer: Self-pay

## 2013-05-18 DIAGNOSIS — Z1231 Encounter for screening mammogram for malignant neoplasm of breast: Secondary | ICD-10-CM

## 2013-05-27 DIAGNOSIS — M23329 Other meniscus derangements, posterior horn of medial meniscus, unspecified knee: Secondary | ICD-10-CM | POA: Diagnosis not present

## 2013-05-27 DIAGNOSIS — G8918 Other acute postprocedural pain: Secondary | ICD-10-CM | POA: Diagnosis not present

## 2013-05-27 DIAGNOSIS — M6750 Plica syndrome, unspecified knee: Secondary | ICD-10-CM | POA: Diagnosis not present

## 2013-05-27 DIAGNOSIS — M659 Synovitis and tenosynovitis, unspecified: Secondary | ICD-10-CM | POA: Diagnosis not present

## 2013-06-15 DIAGNOSIS — M12269 Villonodular synovitis (pigmented), unspecified knee: Secondary | ICD-10-CM | POA: Diagnosis not present

## 2013-06-15 DIAGNOSIS — M25569 Pain in unspecified knee: Secondary | ICD-10-CM | POA: Diagnosis not present

## 2013-06-15 DIAGNOSIS — IMO0002 Reserved for concepts with insufficient information to code with codable children: Secondary | ICD-10-CM | POA: Diagnosis not present

## 2013-06-17 DIAGNOSIS — IMO0002 Reserved for concepts with insufficient information to code with codable children: Secondary | ICD-10-CM | POA: Diagnosis not present

## 2013-06-17 DIAGNOSIS — M1710 Unilateral primary osteoarthritis, unspecified knee: Secondary | ICD-10-CM | POA: Diagnosis not present

## 2013-06-17 DIAGNOSIS — M12269 Villonodular synovitis (pigmented), unspecified knee: Secondary | ICD-10-CM | POA: Diagnosis not present

## 2013-06-17 DIAGNOSIS — M25569 Pain in unspecified knee: Secondary | ICD-10-CM | POA: Diagnosis not present

## 2013-06-18 ENCOUNTER — Encounter (INDEPENDENT_AMBULATORY_CARE_PROVIDER_SITE_OTHER): Payer: Self-pay

## 2013-06-18 ENCOUNTER — Ambulatory Visit
Admission: RE | Admit: 2013-06-18 | Discharge: 2013-06-18 | Disposition: A | Discharge status: Home / Self Care | Payer: Medicare Other | Source: Ambulatory Visit

## 2013-06-18 DIAGNOSIS — Z1231 Encounter for screening mammogram for malignant neoplasm of breast: Secondary | ICD-10-CM | POA: Diagnosis not present

## 2013-06-22 DIAGNOSIS — M12269 Villonodular synovitis (pigmented), unspecified knee: Secondary | ICD-10-CM | POA: Diagnosis not present

## 2013-06-22 DIAGNOSIS — IMO0002 Reserved for concepts with insufficient information to code with codable children: Secondary | ICD-10-CM | POA: Diagnosis not present

## 2013-06-22 DIAGNOSIS — M25569 Pain in unspecified knee: Secondary | ICD-10-CM | POA: Diagnosis not present

## 2013-06-22 DIAGNOSIS — M1710 Unilateral primary osteoarthritis, unspecified knee: Secondary | ICD-10-CM | POA: Diagnosis not present

## 2013-06-24 DIAGNOSIS — IMO0002 Reserved for concepts with insufficient information to code with codable children: Secondary | ICD-10-CM | POA: Diagnosis not present

## 2013-06-24 DIAGNOSIS — M25569 Pain in unspecified knee: Secondary | ICD-10-CM | POA: Diagnosis not present

## 2013-06-24 DIAGNOSIS — M12269 Villonodular synovitis (pigmented), unspecified knee: Secondary | ICD-10-CM | POA: Diagnosis not present

## 2013-06-24 DIAGNOSIS — M1710 Unilateral primary osteoarthritis, unspecified knee: Secondary | ICD-10-CM | POA: Diagnosis not present

## 2013-06-29 DIAGNOSIS — IMO0002 Reserved for concepts with insufficient information to code with codable children: Secondary | ICD-10-CM | POA: Diagnosis not present

## 2013-06-29 DIAGNOSIS — M12269 Villonodular synovitis (pigmented), unspecified knee: Secondary | ICD-10-CM | POA: Diagnosis not present

## 2013-06-29 DIAGNOSIS — M25569 Pain in unspecified knee: Secondary | ICD-10-CM | POA: Diagnosis not present

## 2013-07-01 DIAGNOSIS — M12269 Villonodular synovitis (pigmented), unspecified knee: Secondary | ICD-10-CM | POA: Diagnosis not present

## 2013-07-01 DIAGNOSIS — IMO0002 Reserved for concepts with insufficient information to code with codable children: Secondary | ICD-10-CM | POA: Diagnosis not present

## 2013-07-01 DIAGNOSIS — M25569 Pain in unspecified knee: Secondary | ICD-10-CM | POA: Diagnosis not present

## 2013-07-07 DIAGNOSIS — M12269 Villonodular synovitis (pigmented), unspecified knee: Secondary | ICD-10-CM | POA: Diagnosis not present

## 2013-07-07 DIAGNOSIS — M25569 Pain in unspecified knee: Secondary | ICD-10-CM | POA: Diagnosis not present

## 2013-07-07 DIAGNOSIS — IMO0002 Reserved for concepts with insufficient information to code with codable children: Secondary | ICD-10-CM | POA: Diagnosis not present

## 2013-07-09 DIAGNOSIS — IMO0002 Reserved for concepts with insufficient information to code with codable children: Secondary | ICD-10-CM | POA: Diagnosis not present

## 2013-07-09 DIAGNOSIS — M25569 Pain in unspecified knee: Secondary | ICD-10-CM | POA: Diagnosis not present

## 2013-07-09 DIAGNOSIS — M12269 Villonodular synovitis (pigmented), unspecified knee: Secondary | ICD-10-CM | POA: Diagnosis not present

## 2013-07-13 DIAGNOSIS — M25569 Pain in unspecified knee: Secondary | ICD-10-CM | POA: Diagnosis not present

## 2013-07-13 DIAGNOSIS — IMO0002 Reserved for concepts with insufficient information to code with codable children: Secondary | ICD-10-CM | POA: Diagnosis not present

## 2013-07-13 DIAGNOSIS — M12269 Villonodular synovitis (pigmented), unspecified knee: Secondary | ICD-10-CM | POA: Diagnosis not present

## 2013-07-15 DIAGNOSIS — M12269 Villonodular synovitis (pigmented), unspecified knee: Secondary | ICD-10-CM | POA: Diagnosis not present

## 2013-07-15 DIAGNOSIS — IMO0002 Reserved for concepts with insufficient information to code with codable children: Secondary | ICD-10-CM | POA: Diagnosis not present

## 2013-07-15 DIAGNOSIS — M25569 Pain in unspecified knee: Secondary | ICD-10-CM | POA: Diagnosis not present

## 2013-10-06 ENCOUNTER — Other Ambulatory Visit (INDEPENDENT_AMBULATORY_CARE_PROVIDER_SITE_OTHER): Payer: Medicare Other

## 2013-10-06 DIAGNOSIS — Z79899 Other long term (current) drug therapy: Secondary | ICD-10-CM

## 2013-10-06 DIAGNOSIS — Z0000 Encounter for general adult medical examination without abnormal findings: Secondary | ICD-10-CM | POA: Diagnosis not present

## 2013-10-06 DIAGNOSIS — I10 Essential (primary) hypertension: Secondary | ICD-10-CM | POA: Diagnosis not present

## 2013-10-06 LAB — CBC WITH DIFFERENTIAL/PLATELET
BASOS ABS: 0 10*3/uL (ref 0.0–0.1)
BASOS PCT: 0.7 % (ref 0.0–3.0)
EOS ABS: 0.1 10*3/uL (ref 0.0–0.7)
Eosinophils Relative: 2.2 % (ref 0.0–5.0)
HCT: 36.7 % (ref 36.0–46.0)
Hemoglobin: 12 g/dL (ref 12.0–15.0)
Lymphocytes Relative: 31.7 % (ref 12.0–46.0)
Lymphs Abs: 2 10*3/uL (ref 0.7–4.0)
MCHC: 32.6 g/dL (ref 30.0–36.0)
MCV: 82.4 fl (ref 78.0–100.0)
MONO ABS: 0.5 10*3/uL (ref 0.1–1.0)
Monocytes Relative: 8.3 % (ref 3.0–12.0)
NEUTROS PCT: 57.1 % (ref 43.0–77.0)
Neutro Abs: 3.5 10*3/uL (ref 1.4–7.7)
Platelets: 338 10*3/uL (ref 150.0–400.0)
RBC: 4.46 Mil/uL (ref 3.87–5.11)
RDW: 14.1 % (ref 11.5–15.5)
WBC: 6.2 10*3/uL (ref 4.0–10.5)

## 2013-10-06 LAB — HEPATIC FUNCTION PANEL
ALBUMIN: 3.6 g/dL (ref 3.5–5.2)
ALT: 16 U/L (ref 0–35)
AST: 21 U/L (ref 0–37)
Alkaline Phosphatase: 65 U/L (ref 39–117)
BILIRUBIN DIRECT: 0 mg/dL (ref 0.0–0.3)
TOTAL PROTEIN: 7.1 g/dL (ref 6.0–8.3)
Total Bilirubin: 0.6 mg/dL (ref 0.2–1.2)

## 2013-10-06 LAB — LIPID PANEL
CHOLESTEROL: 201 mg/dL — AB (ref 0–200)
HDL: 80.4 mg/dL (ref >39.00)
LDL Cholesterol: 107 mg/dL — ABNORMAL HIGH (ref 0–99)
NonHDL: 120.6
Total CHOL/HDL Ratio: 3
Triglycerides: 67 mg/dL (ref 0.0–149.0)
VLDL: 13.4 mg/dL (ref 0.0–40.0)

## 2013-10-06 LAB — TSH: TSH: 1.24 u[IU]/mL (ref 0.35–4.50)

## 2013-10-06 LAB — BASIC METABOLIC PANEL
BUN: 14 mg/dL (ref 6–23)
CHLORIDE: 102 meq/L (ref 96–112)
CO2: 31 meq/L (ref 19–32)
CREATININE: 0.8 mg/dL (ref 0.4–1.2)
Calcium: 9.1 mg/dL (ref 8.4–10.5)
GFR: 99.47 mL/min (ref >60.00)
GLUCOSE: 99 mg/dL (ref 70–99)
Potassium: 3.9 mEq/L (ref 3.5–5.1)
Sodium: 140 mEq/L (ref 135–145)

## 2013-10-06 LAB — POCT URINALYSIS DIPSTICK
Bilirubin, UA: NEGATIVE
Blood, UA: NEGATIVE
Glucose, UA: NEGATIVE
Ketones, UA: NEGATIVE
Nitrite, UA: NEGATIVE
PROTEIN UA: NEGATIVE
Spec Grav, UA: 1.015
UROBILINOGEN UA: 1
pH, UA: 7

## 2013-10-13 ENCOUNTER — Ambulatory Visit (INDEPENDENT_AMBULATORY_CARE_PROVIDER_SITE_OTHER): Payer: Medicare Other | Admitting: Family Medicine

## 2013-10-13 ENCOUNTER — Encounter: Payer: Self-pay | Admitting: Family Medicine

## 2013-10-13 VITALS — Temp 97.3°F | Ht 63.0 in | Wt 148.0 lb

## 2013-10-13 DIAGNOSIS — H6120 Impacted cerumen, unspecified ear: Secondary | ICD-10-CM | POA: Diagnosis not present

## 2013-10-13 DIAGNOSIS — H6123 Impacted cerumen, bilateral: Secondary | ICD-10-CM

## 2013-10-13 DIAGNOSIS — Z23 Encounter for immunization: Secondary | ICD-10-CM | POA: Diagnosis not present

## 2013-10-13 DIAGNOSIS — K2100 Gastro-esophageal reflux disease with esophagitis, without bleeding: Secondary | ICD-10-CM

## 2013-10-13 DIAGNOSIS — I10 Essential (primary) hypertension: Secondary | ICD-10-CM | POA: Diagnosis not present

## 2013-10-13 MED ORDER — OMEPRAZOLE 20 MG PO CPDR: 20.0000 mg | DELAYED_RELEASE_CAPSULE | Freq: Every day | ORAL | Status: DC

## 2013-10-13 MED ORDER — HYDROCHLOROTHIAZIDE 25 MG PO TABS: ORAL_TABLET | ORAL | Status: DC

## 2013-10-13 MED ORDER — NADOLOL 20 MG PO TABS: ORAL_TABLET | ORAL | Status: DC

## 2013-10-13 NOTE — Progress Notes (Signed)
Pre visit review using our clinic review tool, if applicable. No additional management support is needed unless otherwise documented below in the visit note. 

## 2013-10-13 NOTE — Progress Notes (Signed)
   Subjective:    Patient ID: Kayla Key, female    DOB: 05/25/1945, 68 y.o.   MRN: [id number]  HPI Kayla Key is a 68 year old female who comes in today for evaluation of hypertension and reflux esophagitis  She takes Corgard 20 mg daily along with hydrochlorothiazide 25 mg daily BP 110/70  She takes Prilosec for reflux esophagitis.  She gets routine eye care, dental care, BSE monthly, and you mammography, colonoscopy 7 years ago normal, vaccinations updated by [facility]  She had a knee operation left knee April 6 by Dr. Cleophas Dunker doing well.  She had a funny event the other day she's not sure what happened. She says it was Monday she was sitting in she's developed some tremor in her right hand that lasted about 2 hours and went away. She denies any headache visual disturbance etc. etc. She's never had a problem like this in past. Negative family history for tremor   Review of Systems  Constitutional: Negative.   HENT: Negative.   Eyes: Negative.   Respiratory: Negative.   Cardiovascular: Negative.   Gastrointestinal: Negative.   Genitourinary: Negative.   Musculoskeletal: Negative.   Neurological: Negative.   Psychiatric/Behavioral: Negative.        Objective:   Physical Exam  Nursing note and vitals reviewed. Constitutional: She is oriented to person, place, and time. She appears well-developed and well-nourished.  HENT:  Head: Normocephalic and atraumatic.  Right Ear: External ear normal.  Left Ear: External ear normal.  Nose: Nose normal.  Mouth/Throat: Oropharynx is clear and moist.  Eyes: EOM are normal. Pupils are equal, round, and reactive to light.  Neck: Normal range of motion. Neck supple. No thyromegaly present.  Cardiovascular: Normal rate, regular rhythm, normal heart sounds and intact distal pulses.  Exam reveals no gallop and no friction rub.   No murmur heard. Pulmonary/Chest: Effort normal and breath sounds normal.  Abdominal: Soft. Bowel sounds are  normal. She exhibits no distension and no mass. There is no tenderness. There is no rebound.  Genitourinary:  Bilateral breast exam normal  Pap smear 3 years ago normal asymptomatic therefore very 3 years  Musculoskeletal: Normal range of motion.  Lymphadenopathy:    She has no cervical adenopathy.  Neurological: She is alert and oriented to person, place, and time. She has normal reflexes. No cranial nerve deficit. She exhibits normal muscle tone. Coordination normal.  Skin: Skin is warm and dry.  Psychiatric: She has a normal mood and affect. Her behavior is normal. Judgment and thought content normal.          Assessment & Plan:  Healthy female  Hypertension at goal continue current therapy  Reflux esophagitis continue Prilosec  One episode of a tremor right hand with no other neurologic symptoms,,,,,,,,,, neuro violation if symptoms recur or persist,

## 2013-10-13 NOTE — Patient Instructions (Signed)
Continue your current medications  Followup in 1 year sooner if any problems  If the tremor of your right hand does recur call and we will get you set up for a neurologic consult

## 2014-02-17 ENCOUNTER — Encounter: Payer: Self-pay | Admitting: Internal Medicine

## 2014-03-02 ENCOUNTER — Encounter: Payer: Self-pay | Admitting: Internal Medicine

## 2014-04-06 ENCOUNTER — Ambulatory Visit (AMBULATORY_SURGERY_CENTER): Payer: Self-pay | Admitting: *Deleted

## 2014-04-06 VITALS — Ht 62.5 in | Wt 150.2 lb

## 2014-04-06 DIAGNOSIS — Z1211 Encounter for screening for malignant neoplasm of colon: Secondary | ICD-10-CM

## 2014-04-06 MED ORDER — MOVIPREP 100 G PO SOLR
ORAL | Status: DC
Start: 2014-04-06 — End: 2014-04-15

## 2014-04-06 NOTE — Progress Notes (Signed)
No allergies to eggs or soy. No problems with anesthesia.  Pt given Emmi instructions for colonoscopy  No oxygen use  No diet drug use  

## 2014-04-15 ENCOUNTER — Encounter: Payer: Self-pay | Admitting: Internal Medicine

## 2014-04-15 ENCOUNTER — Ambulatory Visit (AMBULATORY_SURGERY_CENTER): Payer: Medicare Other | Admitting: Internal Medicine

## 2014-04-15 VITALS — BP 111/51 | HR 58 | Temp 96.2°F | Resp 21 | Ht 62.5 in | Wt 150.0 lb

## 2014-04-15 DIAGNOSIS — Z1211 Encounter for screening for malignant neoplasm of colon: Secondary | ICD-10-CM

## 2014-04-15 DIAGNOSIS — J45909 Unspecified asthma, uncomplicated: Secondary | ICD-10-CM | POA: Diagnosis not present

## 2014-04-15 DIAGNOSIS — E669 Obesity, unspecified: Secondary | ICD-10-CM | POA: Diagnosis not present

## 2014-04-15 DIAGNOSIS — I10 Essential (primary) hypertension: Secondary | ICD-10-CM | POA: Diagnosis not present

## 2014-04-15 DIAGNOSIS — R9431 Abnormal electrocardiogram [ECG] [EKG]: Secondary | ICD-10-CM | POA: Diagnosis not present

## 2014-04-15 MED ORDER — SODIUM CHLORIDE 0.9 % IV SOLN: 500.0000 mL | INTRAVENOUS | Status: DC

## 2014-04-15 NOTE — Patient Instructions (Signed)
YOU HAD AN ENDOSCOPIC PROCEDURE TODAY AT THE [facility]:   Refer to the procedure report that was given to you for any specific questions about what was found during the examination.  If the procedure report does not answer your questions, please call your gastroenterologist to clarify.  If you requested that your care partner not be given the details of your procedure findings, then the procedure report has been included in a sealed envelope for you to review at your convenience later.  YOU SHOULD EXPECT: Some feelings of bloating in the abdomen. Passage of more gas than usual.  Walking can help get rid of the air that was put into your GI tract during the procedure and reduce the bloating. If you had a lower endoscopy (such as a colonoscopy or flexible sigmoidoscopy) you may notice spotting of blood in your stool or on the toilet paper. If you underwent a bowel prep for your procedure, you may not have a normal bowel movement for a few days.  Please Note:  You might notice some irritation and congestion in your nose or some drainage.  This is from the oxygen used during your procedure.  There is no need for concern and it should clear up in a day or so.  SYMPTOMS TO REPORT IMMEDIATELY:   Following lower endoscopy (colonoscopy or flexible sigmoidoscopy):  Excessive amounts of blood in the stool  Significant tenderness or worsening of abdominal pains  Swelling of the abdomen that is new, acute  Fever of 100F or higher  For urgent or emergent issues, a gastroenterologist can be reached at any hour by calling [phone].  DIET: Your first meal following the procedure should be a small meal and then it is ok to progress to your normal diet. Heavy or fried foods are harder to digest and may make you feel nauseous or bloated.  Likewise, meals heavy in dairy and vegetables can increase bloating.  Drink plenty of fluids but you should avoid alcoholic beverages for 24 hours.  ACTIVITY:   You should plan to take it easy for the rest of today and you should NOT DRIVE or use heavy machinery until tomorrow (because of the sedation medicines used during the test).    FOLLOW UP: Our staff will call the number listed on your records the next business day following your procedure to check on you and address any questions or concerns that you may have regarding the information given to you following your procedure. If we do not reach you, we will leave a message.  However, if you are feeling well and you are not experiencing any problems, there is no need to return our call.  We will assume that you have returned to your regular daily activities without incident.  SIGNATURES/CONFIDENTIALITY: You and/or your care partner have signed paperwork which will be entered into your electronic medical record.  These signatures attest to the fact that that the information above on your After Visit Summary has been reviewed and is understood.  Full responsibility of the confidentiality of this discharge information lies with you and/or your care-partner.  Continue your normal medications  Please read over handout about high fiber diets and follow high fiber diet  Next colonoscopy- 10 years

## 2014-04-15 NOTE — Op Note (Signed)
[facility] [address] [place], [id number]   COLONOSCOPY PROCEDURE REPORT  PATIENT: Kayla Key, Kayla Key  MR#: [id number] BIRTHDATE: [birth year] , 68  yrs. old GENDER: female ENDOSCOPIST: Hart Carwinora M Astryd Pearcy, MD REFERRED QM:VHQIONGBY:Jeffrey Shawnie DapperA Todd, M.D. PROCEDURE DATE:  04/15/2014 PROCEDURE:   Colonoscopy, screening First Screening Colonoscopy - Avg.  risk and is 50 yrs.  old or older - No.  Prior Negative Screening - Now for repeat screening. 10 or more years since last screening  History of Adenoma - Now for follow-up colonoscopy & has been > or = to 3 yrs.  N/A ASA CLASS:   Class I INDICATIONS:Screening for colonic neoplasia and Colorectal Neoplasm Risk Assessment for this procedure is average risk. MEDICATIONS: Monitored anesthesia care and Propofol 200 mg IV  DESCRIPTION OF PROCEDURE:   After the risks benefits and alternatives of the procedure were thoroughly explained, informed consent was obtained.  The digital rectal exam revealed no abnormalities of the rectum.   The LB PFC-H190 [id number]  endoscope was introduced through the anus and advanced to the cecum, which was identified by both the appendix and ileocecal valve. No adverse events experienced.   The quality of the prep was good.  (MoviPrep was used)  The instrument was then slowly withdrawn as the colon was fully examined.      COLON FINDINGS: A normal appearing cecum, ileocecal valve, and appendiceal orifice were identified.  The ascending, transverse, descending, sigmoid colon, and rectum appeared unremarkable. Retroflexed views revealed no abnormalities. The time to cecum = 5.42 Withdrawal time = 7.24   The scope was withdrawn and the procedure completed. COMPLICATIONS: There were no immediate complications.  ENDOSCOPIC IMPRESSION: Normal colonoscopy  RECOMMENDATIONS: High fiber diet Recall colonoscopy in 10 years  eSigned:  Hart Carwinora M Draedyn Weidinger, MD 04/15/2014 11:22 AM   cc:

## 2014-04-15 NOTE — Progress Notes (Signed)
Report to PACU, RN, vss, BBS= Clear.  

## 2014-04-18 ENCOUNTER — Telehealth: Payer: Self-pay | Admitting: *Deleted

## 2014-04-18 NOTE — Telephone Encounter (Signed)
  Follow up Call-  Call back number 04/15/2014  Post procedure Call Back phone  # [phone]  Permission to leave phone message Yes     Patient questions:  Do you have a fever, pain , or abdominal swelling? No. Pain Score  0 *  Have you tolerated food without any problems? Yes.    Have you been able to return to your normal activities? Yes.    Do you have any questions about your discharge instructions: Diet   No. Medications  No. Follow up visit  No.  Do you have questions or concerns about your Care? No.  Actions: * If pain score is 4 or above: No action needed, pain <4.

## 2014-05-23 ENCOUNTER — Other Ambulatory Visit: Payer: Self-pay

## 2014-05-23 DIAGNOSIS — Z1231 Encounter for screening mammogram for malignant neoplasm of breast: Secondary | ICD-10-CM

## 2014-06-23 ENCOUNTER — Ambulatory Visit: Payer: Medicare Other

## 2014-06-29 ENCOUNTER — Ambulatory Visit
Admission: RE | Admit: 2014-06-29 | Discharge: 2014-06-29 | Disposition: A | Discharge status: Home / Self Care | Payer: Medicare Other | Source: Ambulatory Visit

## 2014-06-29 DIAGNOSIS — Z1231 Encounter for screening mammogram for malignant neoplasm of breast: Secondary | ICD-10-CM | POA: Diagnosis not present

## 2014-10-17 ENCOUNTER — Other Ambulatory Visit (INDEPENDENT_AMBULATORY_CARE_PROVIDER_SITE_OTHER): Payer: Medicare Other

## 2014-10-17 ENCOUNTER — Ambulatory Visit (INDEPENDENT_AMBULATORY_CARE_PROVIDER_SITE_OTHER): Payer: Medicare Other | Admitting: Internal Medicine

## 2014-10-17 ENCOUNTER — Encounter: Payer: Self-pay | Admitting: Internal Medicine

## 2014-10-17 ENCOUNTER — Encounter (INDEPENDENT_AMBULATORY_CARE_PROVIDER_SITE_OTHER): Payer: Self-pay

## 2014-10-17 VITALS — BP 110/70 | HR 60 | Temp 98.1°F | Resp 16 | Ht 63.0 in | Wt 148.0 lb

## 2014-10-17 DIAGNOSIS — E789 Disorder of lipoprotein metabolism, unspecified: Secondary | ICD-10-CM

## 2014-10-17 DIAGNOSIS — I10 Essential (primary) hypertension: Secondary | ICD-10-CM

## 2014-10-17 DIAGNOSIS — Z0000 Encounter for general adult medical examination without abnormal findings: Secondary | ICD-10-CM | POA: Diagnosis not present

## 2014-10-17 DIAGNOSIS — Z23 Encounter for immunization: Secondary | ICD-10-CM

## 2014-10-17 DIAGNOSIS — M810 Age-related osteoporosis without current pathological fracture: Secondary | ICD-10-CM | POA: Diagnosis not present

## 2014-10-17 LAB — COMPREHENSIVE METABOLIC PANEL
ALT: 13 U/L (ref 0–35)
AST: 15 U/L (ref 0–37)
Albumin: 3.7 g/dL (ref 3.5–5.2)
Alkaline Phosphatase: 62 U/L (ref 39–117)
BUN: 15 mg/dL (ref 6–23)
CALCIUM: 9 mg/dL (ref 8.4–10.5)
CHLORIDE: 103 meq/L (ref 96–112)
CO2: 32 mEq/L (ref 19–32)
Creatinine, Ser: 0.88 mg/dL (ref 0.40–1.20)
GFR: 82.45 mL/min (ref >60.00)
Glucose, Bld: 101 mg/dL — ABNORMAL HIGH (ref 70–99)
Potassium: 3.8 mEq/L (ref 3.5–5.1)
Sodium: 141 mEq/L (ref 135–145)
Total Bilirubin: 0.6 mg/dL (ref 0.2–1.2)
Total Protein: 7 g/dL (ref 6.0–8.3)

## 2014-10-17 LAB — LIPID PANEL
Cholesterol: 187 mg/dL (ref 0–200)
HDL: 74 mg/dL (ref >39.00)
LDL CALC: 103 mg/dL — AB (ref 0–99)
NonHDL: 112.9
TRIGLYCERIDES: 52 mg/dL (ref 0.0–149.0)
Total CHOL/HDL Ratio: 3
VLDL: 10.4 mg/dL (ref 0.0–40.0)

## 2014-10-17 NOTE — Progress Notes (Signed)
Pre visit review using our clinic review tool, if applicable. No additional management support is needed unless otherwise documented below in the visit note. 

## 2014-10-17 NOTE — Assessment & Plan Note (Signed)
Unclear if this is a true diagnosis. She is not on bone therapy and no recent DEXA for me to view. Will order and add therapy as indicated. Advised that she should be taking calcium and vitamin D.

## 2014-10-17 NOTE — Assessment & Plan Note (Signed)
Checking CMP and adjust as indicated. Continue HCTZ and nadolol for now. BP at goal.

## 2014-10-17 NOTE — Progress Notes (Signed)
   Subjective:    Patient ID: Kayla Key, female    DOB: [birth year], 69 y.o.   MRN: [id number]  HPI Here for medicare wellness, no new complaints. Please see A/P for status and treatment of chronic medical problems.   Diet: heart healthy Physical activity: active Depression/mood screen: negative Hearing: intact to whispered voice Visual acuity: grossly normal, performs annual eye exam  ADLs: capable Fall risk: none Home safety: good Cognitive evaluation: intact to orientation, naming, recall and repetition EOL planning: adv directives discussed, not in place has not talked to her family about her wishes  I have personally reviewed and have noted 1. The patient's medical and social history - reviewed today no changes 2. Their use of alcohol, tobacco or illicit drugs 3. Their current medications and supplements 4. The patient's functional ability including ADL's, fall risks, home safety risks and hearing or visual impairment. 5. Diet and physical activities 6. Evidence for depression or mood disorders 7. Care team reviewed and updated (available in snapshot)  Review of Systems  Constitutional: Negative for fever, activity change, appetite change, fatigue and unexpected weight change.  HENT: Negative.   Eyes: Negative.   Respiratory: Negative for cough, chest tightness, shortness of breath and wheezing.   Cardiovascular: Negative for chest pain, palpitations and leg swelling.  Gastrointestinal: Negative for nausea, abdominal pain, diarrhea, constipation and abdominal distention.  Musculoskeletal: Negative.   Skin: Negative.   Neurological: Negative.   Psychiatric/Behavioral: Negative.       Objective:   Physical Exam  Constitutional: She is oriented to person, place, and time. She appears well-developed and well-nourished.  HENT:  Head: Normocephalic and atraumatic.  Eyes: EOM are normal.  Neck: Normal range of motion.  Cardiovascular: Normal rate and regular rhythm.    Pulmonary/Chest: Effort normal and breath sounds normal. No respiratory distress. She has no wheezes. She has no rales.  Abdominal: Soft. Bowel sounds are normal. She exhibits no distension. There is no tenderness. There is no rebound.  Musculoskeletal: She exhibits no edema.  Neurological: She is alert and oriented to person, place, and time. Coordination normal.  Skin: Skin is warm and dry.  Psychiatric: She has a normal mood and affect.   Filed Vitals:   10/17/14 0907  BP: 110/70  Pulse: 60  Temp: 98.1 F (36.7 C)  TempSrc: Oral  Resp: 16  Height:  (1.6 m)  Weight: 148 lb (67.132 kg)  SpO2: 98%      Assessment & Plan:  Flu and pneumonia 23 shot given today.

## 2014-10-17 NOTE — Patient Instructions (Signed)
We have given you the flu and pneumonia shot. This is the last pneumonia shot you need.   Check with your insurance about getting the shingles shot the next time you are here.   I have ordered the bone density scan so we can see how strong your bones are.   Come back in about 1 year for a check up or call us sooner if you are having any problems or questions.   Health Maintenance Adopting a healthy lifestyle and getting preventive care can go a long way to promote health and wellness. Talk with your health care provider about what schedule of regular examinations is right for you. This is a good chance for you to check in with your provider about disease prevention and staying healthy. In between checkups, there are plenty of things you can do on your own. Experts have done a lot of research about which lifestyle changes and preventive measures are most likely to keep you healthy. Ask your health care provider for more information. WEIGHT AND DIET  Eat a healthy diet  Be sure to include plenty of vegetables, fruits, low-fat dairy products, and lean protein.  Do not eat a lot of foods high in solid fats, added sugars, or salt.  Get regular exercise. This is one of the most important things you can do for your health.  Most adults should exercise for at least 150 minutes each week. The exercise should increase your heart rate and make you sweat (moderate-intensity exercise).  Most adults should also do strengthening exercises at least twice a week. This is in addition to the moderate-intensity exercise.  Maintain a healthy weight  Body mass index (BMI) is a measurement that can be used to identify possible weight problems. It estimates body fat based on height and weight. Your health care provider can help determine your BMI and help you achieve or maintain a healthy weight.  For females 67 years of age and older:   A BMI below 18.5 is considered underweight.  A BMI of 18.5 to 24.9 is  normal.  A BMI of 25 to 29.9 is considered overweight.  A BMI of 30 and above is considered obese.  Watch levels of cholesterol and blood lipids  You should start having your blood tested for lipids and cholesterol at 67 years of age, then have this test every 5 years.  You may need to have your cholesterol levels checked more often if:  Your lipid or cholesterol levels are high.  You are older than 67 years of age.  You are at high risk for heart disease.  CANCER SCREENING   Lung Cancer  Lung cancer screening is recommended for adults 31-44 years old who are at high risk for lung cancer because of a history of smoking.  A yearly low-dose CT scan of the lungs is recommended for people who:  Currently smoke.  Have quit within the past 15 years.  Have at least a 30-pack-year history of smoking. A pack year is smoking an average of one pack of cigarettes a day for 1 year.  Yearly screening should continue until it has been 15 years since you quit.  Yearly screening should stop if you develop a health problem that would prevent you from having lung cancer treatment.  Breast Cancer  Practice breast self-awareness. This means understanding how your breasts normally appear and feel.  It also means doing regular breast self-exams. Let your health care provider know about any changes, no matter  how small.  If you are in your 67s or 30s you should have a clinical breast exam (CBE) by a health care provider every 1-3 years as part of a regular health exam.  If you are 67 or older, have a CBE every year. Also consider having a breast X-ray (mammogram) every year.  If you have a family history of breast cancer, talk to your health care provider about genetic screening.  If you are at high risk for breast cancer, talk to your health care provider about having an MRI and a mammogram every year.  Breast cancer gene (BRCA) assessment is recommended for women who have family members  with BRCA-related cancers. BRCA-related cancers include:  Breast.  Ovarian.  Tubal.  Peritoneal cancers.  Results of the assessment will determine the need for genetic counseling and BRCA1 and BRCA2 testing. Cervical Cancer Routine pelvic examinations to screen for cervical cancer are no longer recommended for nonpregnant women who are considered low risk for cancer of the pelvic organs (ovaries, uterus, and vagina) and who do not have symptoms. A pelvic examination may be necessary if you have symptoms including those associated with pelvic infections. Ask your health care provider if a screening pelvic exam is right for you.   The Pap test is the screening test for cervical cancer for women who are considered at risk.  If you had a hysterectomy for a problem that was not cancer or a condition that could lead to cancer, then you no longer need Pap tests.  If you are older than 67 years, and you have had normal Pap tests for the past 10 years, you no longer need to have Pap tests.  If you have had past treatment for cervical cancer or a condition that could lead to cancer, you need Pap tests and screening for cancer for at least 20 years after your treatment.  If you no longer get a Pap test, assess your risk factors if they change (such as having a new sexual partner). This can affect whether you should start being screened again.  Some women have medical problems that increase their chance of getting cervical cancer. If this is the case for you, your health care provider may recommend more frequent screening and Pap tests.  The human papillomavirus (HPV) test is another test that may be used for cervical cancer screening. The HPV test looks for the virus that can cause cell changes in the cervix. The cells collected during the Pap test can be tested for HPV.  The HPV test can be used to screen women 67 years of age and older. Getting tested for HPV can extend the interval between normal  Pap tests from three to five years.  An HPV test also should be used to screen women of any age who have unclear Pap test results.  After 67 years of age, women should have HPV testing as often as Pap tests.  Colorectal Cancer  This type of cancer can be detected and often prevented.  Routine colorectal cancer screening usually begins at 67 years of age and continues through 67 years of age.  Your health care provider may recommend screening at an earlier age if you have risk factors for colon cancer.  Your health care provider may also recommend using home test kits to check for hidden blood in the stool.  A small camera at the end of a tube can be used to examine your colon directly (sigmoidoscopy or colonoscopy). This is done  to check for the earliest forms of colorectal cancer.  Routine screening usually begins at age 42.  Direct examination of the colon should be repeated every 5-10 years through 67 years of age. However, you may need to be screened more often if early forms of precancerous polyps or small growths are found. Skin Cancer  Check your skin from head to toe regularly.  Tell your health care provider about any new moles or changes in moles, especially if there is a change in a mole's shape or color.  Also tell your health care provider if you have a mole that is larger than the size of a pencil eraser.  Always use sunscreen. Apply sunscreen liberally and repeatedly throughout the day.  Protect yourself by wearing long sleeves, pants, a wide-brimmed hat, and sunglasses whenever you are outside. HEART DISEASE, DIABETES, AND HIGH BLOOD PRESSURE   Have your blood pressure checked at least every 1-2 years. High blood pressure causes heart disease and increases the risk of stroke.  If you are between 51 years and 81 years old, ask your health care provider if you should take aspirin to prevent strokes.  Have regular diabetes screenings. This involves taking a blood  sample to check your fasting blood sugar level.  If you are at a normal weight and have a low risk for diabetes, have this test once every three years after 67 years of age.  If you are overweight and have a high risk for diabetes, consider being tested at a younger age or more often. PREVENTING INFECTION  Hepatitis B  If you have a higher risk for hepatitis B, you should be screened for this virus. You are considered at high risk for hepatitis B if:  You were born in a country where hepatitis B is common. Ask your health care provider which countries are considered high risk.  Your parents were born in a high-risk country, and you have not been immunized against hepatitis B (hepatitis B vaccine).  You have HIV or AIDS.  You use needles to inject street drugs.  You live with someone who has hepatitis B.  You have had sex with someone who has hepatitis B.  You get hemodialysis treatment.  You take certain medicines for conditions, including cancer, organ transplantation, and autoimmune conditions. Hepatitis C  Blood testing is recommended for:  Everyone born from [birth year] through 1963.  Anyone with known risk factors for hepatitis C. Sexually transmitted infections (STIs)  You should be screened for sexually transmitted infections (STIs) including gonorrhea and chlamydia if:  You are sexually active and are younger than 68 years of age.  You are older than 67 years of age and your health care provider tells you that you are at risk for this type of infection.  Your sexual activity has changed since you were last screened and you are at an increased risk for chlamydia or gonorrhea. Ask your health care provider if you are at risk.  If you do not have HIV, but are at risk, it may be recommended that you take a prescription medicine daily to prevent HIV infection. This is called pre-exposure prophylaxis (PrEP). You are considered at risk if:  You are sexually active and do not  regularly use condoms or know the HIV status of your partner(s).  You take drugs by injection.  You are sexually active with a partner who has HIV. Talk with your health care provider about whether you are at high risk of being infected with  HIV. If you choose to begin PrEP, you should first be tested for HIV. You should then be tested every 3 months for as long as you are taking PrEP.  PREGNANCY   If you are premenopausal and you may become pregnant, ask your health care provider about preconception counseling.  If you may become pregnant, take 400 to 800 micrograms (mcg) of folic acid every day.  If you want to prevent pregnancy, talk to your health care provider about birth control (contraception). OSTEOPOROSIS AND MENOPAUSE   Osteoporosis is a disease in which the bones lose minerals and strength with aging. This can result in serious bone fractures. Your risk for osteoporosis can be identified using a bone density scan.  If you are 61 years of age or older, or if you are at risk for osteoporosis and fractures, ask your health care provider if you should be screened.  Ask your health care provider whether you should take a calcium or vitamin D supplement to lower your risk for osteoporosis.  Menopause may have certain physical symptoms and risks.  Hormone replacement therapy may reduce some of these symptoms and risks. Talk to your health care provider about whether hormone replacement therapy is right for you.  HOME CARE INSTRUCTIONS   Schedule regular health, dental, and eye exams.  Stay current with your immunizations.   Do not use any tobacco products including cigarettes, chewing tobacco, or electronic cigarettes.  If you are pregnant, do not drink alcohol.  If you are breastfeeding, limit how much and how often you drink alcohol.  Limit alcohol intake to no more than 1 drink per day for nonpregnant women. One drink equals 12 ounces of beer, 5 ounces of wine, or 1  ounces of hard liquor.  Do not use street drugs.  Do not share needles.  Ask your health care provider for help if you need support or information about quitting drugs.  Tell your health care provider if you often feel depressed.  Tell your health care provider if you have ever been abused or do not feel safe at home. Document Released: 07/30/2010 Document Revised: 05/31/2013 Document Reviewed: 12/16/2012 [facility] Patient Information 2015 [place], [place]. This information is not intended to replace advice given to you by your health care provider. Make sure you discuss any questions you have with your health care provider.

## 2014-10-17 NOTE — Assessment & Plan Note (Signed)
Given flu shot and pneumonia 23 shot today. Ordered Dexa, mammogram and colonoscopy up to date. Given 10 year screening recommendations. Talked to her about advanced care planning and the need to establish that for her family.

## 2014-10-25 ENCOUNTER — Other Ambulatory Visit: Payer: Self-pay | Admitting: Family Medicine

## 2014-11-03 ENCOUNTER — Other Ambulatory Visit: Payer: Self-pay | Admitting: Family Medicine

## 2014-11-14 ENCOUNTER — Telehealth: Payer: Self-pay | Admitting: Internal Medicine

## 2014-11-14 ENCOUNTER — Other Ambulatory Visit: Payer: Self-pay | Admitting: Geriatric Medicine

## 2014-11-14 MED ORDER — HYDROCHLOROTHIAZIDE 25 MG PO TABS: 25.0000 mg | ORAL_TABLET | Freq: Every day | ORAL | Status: DC

## 2014-11-14 NOTE — Telephone Encounter (Signed)
Sent to pharmacy 

## 2014-11-14 NOTE — Telephone Encounter (Signed)
Patient is requesting hydrochlorothiazide to be sent to [facility] on [address].

## 2014-11-18 ENCOUNTER — Ambulatory Visit (INDEPENDENT_AMBULATORY_CARE_PROVIDER_SITE_OTHER)
Admission: RE | Admit: 2014-11-18 | Discharge: 2014-11-18 | Disposition: A | Discharge status: Home / Self Care | Payer: Medicare Other | Source: Ambulatory Visit | Attending: Internal Medicine | Admitting: Internal Medicine

## 2014-11-18 DIAGNOSIS — M810 Age-related osteoporosis without current pathological fracture: Secondary | ICD-10-CM

## 2014-11-19 ENCOUNTER — Other Ambulatory Visit: Payer: Self-pay | Admitting: Family Medicine

## 2014-12-14 ENCOUNTER — Other Ambulatory Visit: Payer: Self-pay | Admitting: Family Medicine

## 2014-12-23 ENCOUNTER — Other Ambulatory Visit: Payer: Self-pay | Admitting: Internal Medicine

## 2015-02-02 ENCOUNTER — Other Ambulatory Visit: Payer: Self-pay | Admitting: Family Medicine

## 2015-02-19 ENCOUNTER — Other Ambulatory Visit: Payer: Self-pay | Admitting: Family Medicine

## 2015-05-17 ENCOUNTER — Other Ambulatory Visit: Payer: Self-pay | Admitting: Internal Medicine

## 2015-05-25 ENCOUNTER — Other Ambulatory Visit: Payer: Self-pay

## 2015-05-25 DIAGNOSIS — Z1231 Encounter for screening mammogram for malignant neoplasm of breast: Secondary | ICD-10-CM

## 2015-07-03 ENCOUNTER — Ambulatory Visit: Payer: Federal, State, Local not specified - PPO

## 2015-07-06 ENCOUNTER — Ambulatory Visit
Admission: RE | Admit: 2015-07-06 | Discharge: 2015-07-06 | Disposition: A | Discharge status: Home / Self Care | Payer: Medicare Other | Source: Ambulatory Visit

## 2015-07-06 DIAGNOSIS — Z1231 Encounter for screening mammogram for malignant neoplasm of breast: Secondary | ICD-10-CM

## 2015-09-09 ENCOUNTER — Other Ambulatory Visit: Payer: Self-pay | Admitting: Internal Medicine

## 2015-10-19 ENCOUNTER — Encounter: Payer: Self-pay | Admitting: Internal Medicine

## 2015-10-19 ENCOUNTER — Other Ambulatory Visit (INDEPENDENT_AMBULATORY_CARE_PROVIDER_SITE_OTHER): Payer: Medicare Other

## 2015-10-19 ENCOUNTER — Ambulatory Visit (INDEPENDENT_AMBULATORY_CARE_PROVIDER_SITE_OTHER): Payer: Medicare Other | Admitting: Internal Medicine

## 2015-10-19 VITALS — BP 124/70 | HR 60 | Temp 98.4°F | Resp 12 | Ht 62.5 in | Wt 144.0 lb

## 2015-10-19 DIAGNOSIS — E785 Hyperlipidemia, unspecified: Secondary | ICD-10-CM

## 2015-10-19 DIAGNOSIS — Z0000 Encounter for general adult medical examination without abnormal findings: Secondary | ICD-10-CM

## 2015-10-19 DIAGNOSIS — I10 Essential (primary) hypertension: Secondary | ICD-10-CM

## 2015-10-19 DIAGNOSIS — K2100 Gastro-esophageal reflux disease with esophagitis, without bleeding: Secondary | ICD-10-CM

## 2015-10-19 DIAGNOSIS — Z23 Encounter for immunization: Secondary | ICD-10-CM

## 2015-10-19 DIAGNOSIS — Z1159 Encounter for screening for other viral diseases: Secondary | ICD-10-CM

## 2015-10-19 LAB — CBC
HCT: 35.7 % — ABNORMAL LOW (ref 36.0–46.0)
HEMOGLOBIN: 11.7 g/dL — AB (ref 12.0–15.0)
MCHC: 32.8 g/dL (ref 30.0–36.0)
MCV: 82.2 fl (ref 78.0–100.0)
Platelets: 319 10*3/uL (ref 150.0–400.0)
RBC: 4.34 Mil/uL (ref 3.87–5.11)
RDW: 14.4 % (ref 11.5–15.5)
WBC: 8.6 10*3/uL (ref 4.0–10.5)

## 2015-10-19 LAB — LIPID PANEL
Cholesterol: 169 mg/dL (ref 0–200)
HDL: 72.8 mg/dL (ref >39.00)
LDL Cholesterol: 86 mg/dL (ref 0–99)
NONHDL: 96.57
Total CHOL/HDL Ratio: 2
Triglycerides: 53 mg/dL (ref 0.0–149.0)
VLDL: 10.6 mg/dL (ref 0.0–40.0)

## 2015-10-19 LAB — COMPREHENSIVE METABOLIC PANEL
ALBUMIN: 3.7 g/dL (ref 3.5–5.2)
ALK PHOS: 62 U/L (ref 39–117)
ALT: 17 U/L (ref 0–35)
AST: 18 U/L (ref 0–37)
BILIRUBIN TOTAL: 0.6 mg/dL (ref 0.2–1.2)
BUN: 19 mg/dL (ref 6–23)
CO2: 33 mEq/L — ABNORMAL HIGH (ref 19–32)
CREATININE: 1 mg/dL (ref 0.40–1.20)
Calcium: 8.8 mg/dL (ref 8.4–10.5)
Chloride: 104 mEq/L (ref 96–112)
GFR: 70.93 mL/min (ref >60.00)
GLUCOSE: 87 mg/dL (ref 70–99)
POTASSIUM: 3.8 meq/L (ref 3.5–5.1)
SODIUM: 141 meq/L (ref 135–145)
TOTAL PROTEIN: 7.2 g/dL (ref 6.0–8.3)

## 2015-10-19 NOTE — Progress Notes (Signed)
Pre visit review using our clinic review tool, if applicable. No additional management support is needed unless otherwise documented below in the visit note. 

## 2015-10-19 NOTE — Progress Notes (Signed)
   Subjective:    Patient ID: Kayla Key, female    DOB: November 09, 1945, 70 y.o.   MRN: [id number]  HPI Here for medicare wellness, no new complaints. Please see A/P for status and treatment of chronic medical problems.   Diet: heart healthy Physical activity: sedentary Depression/mood screen: negative Hearing: intact to whispered voice Visual acuity: grossly normal with lens, performs annual eye exam  ADLs: capable Fall risk: none Home safety: good Cognitive evaluation: intact to orientation, naming, recall and repetition EOL planning: adv directives discussed  I have personally reviewed and have noted 1. The patient's medical and social history - reviewed today no changes 2. Their use of alcohol, tobacco or illicit drugs 3. Their current medications and supplements 4. The patient's functional ability including ADL's, fall risks, home safety risks and hearing or visual impairment. 5. Diet and physical activities 6. Evidence for depression or mood disorders 7. Care team reviewed and updated (available in snapshot)  Review of Systems  Constitutional: Negative for activity change, appetite change, fatigue, fever and unexpected weight change.  HENT: Negative.   Eyes: Negative.   Respiratory: Negative for cough, chest tightness, shortness of breath and wheezing.   Cardiovascular: Negative for chest pain, palpitations and leg swelling.  Gastrointestinal: Negative for abdominal distention, abdominal pain, constipation, diarrhea and nausea.  Musculoskeletal: Negative.   Skin: Negative.   Neurological: Negative.   Psychiatric/Behavioral: Negative.       Objective:   Physical Exam  Constitutional: She is oriented to person, place, and time. She appears well-developed and well-nourished.  HENT:  Head: Normocephalic and atraumatic.  Eyes: EOM are normal.  Neck: Normal range of motion.  Cardiovascular: Normal rate and regular rhythm.   Pulmonary/Chest: Effort normal and breath  sounds normal. No respiratory distress. She has no wheezes. She has no rales.  Abdominal: Soft. Bowel sounds are normal. She exhibits no distension. There is no tenderness. There is no rebound.  Musculoskeletal: She exhibits no edema.  Neurological: She is alert and oriented to person, place, and time. Coordination normal.  Skin: Skin is warm and dry.  Psychiatric: She has a normal mood and affect.   Vitals:   10/19/15 1544  BP: 124/70  Pulse: 60  Resp: 12  Temp: 98.4 F (36.9 C)  TempSrc: Oral  SpO2: 98%  Weight: 144 lb (65.3 kg)  Height: 5' 2.5" (1.588 m)      Assessment & Plan:  Flu shot given at visit

## 2015-10-19 NOTE — Patient Instructions (Signed)
We will check the labs today and call you back about the results.   Health Maintenance, Female Adopting a healthy lifestyle and getting preventive care can go a long way to promote health and wellness. Talk with your health care provider about what schedule of regular examinations is right for you. This is a good chance for you to check in with your provider about disease prevention and staying healthy. In between checkups, there are plenty of things you can do on your own. Experts have done a lot of research about which lifestyle changes and preventive measures are most likely to keep you healthy. Ask your health care provider for more information. WEIGHT AND DIET  Eat a healthy diet  Be sure to include plenty of vegetables, fruits, low-fat dairy products, and lean protein.  Do not eat a lot of foods high in solid fats, added sugars, or salt.  Get regular exercise. This is one of the most important things you can do for your health.  Most adults should exercise for at least 150 minutes each week. The exercise should increase your heart rate and make you sweat (moderate-intensity exercise).  Most adults should also do strengthening exercises at least twice a week. This is in addition to the moderate-intensity exercise.  Maintain a healthy weight  Body mass index (BMI) is a measurement that can be used to identify possible weight problems. It estimates body fat based on height and weight. Your health care provider can help determine your BMI and help you achieve or maintain a healthy weight.  For females 82 years of age and older:   A BMI below 18.5 is considered underweight.  A BMI of 18.5 to 24.9 is normal.  A BMI of 25 to 29.9 is considered overweight.  A BMI of 30 and above is considered obese.  Watch levels of cholesterol and blood lipids  You should start having your blood tested for lipids and cholesterol at 68 years of age, then have this test every 5 years.  You may need  to have your cholesterol levels checked more often if:  Your lipid or cholesterol levels are high.  You are older than 68 years of age.  You are at high risk for heart disease.  CANCER SCREENING   Lung Cancer  Lung cancer screening is recommended for adults 35-40 years old who are at high risk for lung cancer because of a history of smoking.  A yearly low-dose CT scan of the lungs is recommended for people who:  Currently smoke.  Have quit within the past 15 years.  Have at least a 30-pack-year history of smoking. A pack year is smoking an average of one pack of cigarettes a day for 1 year.  Yearly screening should continue until it has been 15 years since you quit.  Yearly screening should stop if you develop a health problem that would prevent you from having lung cancer treatment.  Breast Cancer  Practice breast self-awareness. This means understanding how your breasts normally appear and feel.  It also means doing regular breast self-exams. Let your health care provider know about any changes, no matter how small.  If you are in your 20s or 30s, you should have a clinical breast exam (CBE) by a health care provider every 1-3 years as part of a regular health exam.  If you are 65 or older, have a CBE every year. Also consider having a breast X-ray (mammogram) every year.  If you have a family history  of breast cancer, talk to your health care provider about genetic screening.  If you are at high risk for breast cancer, talk to your health care provider about having an MRI and a mammogram every year.  Breast cancer gene (BRCA) assessment is recommended for women who have family members with BRCA-related cancers. BRCA-related cancers include:  Breast.  Ovarian.  Tubal.  Peritoneal cancers.  Results of the assessment will determine the need for genetic counseling and BRCA1 and BRCA2 testing. Cervical Cancer Your health care provider may recommend that you be  screened regularly for cancer of the pelvic organs (ovaries, uterus, and vagina). This screening involves a pelvic examination, including checking for microscopic changes to the surface of your cervix (Pap test). You may be encouraged to have this screening done every 3 years, beginning at age 39.  For women ages 39-65, health care providers may recommend pelvic exams and Pap testing every 3 years, or they may recommend the Pap and pelvic exam, combined with testing for human papilloma virus (HPV), every 5 years. Some types of HPV increase your risk of cervical cancer. Testing for HPV may also be done on women of any age with unclear Pap test results.  Other health care providers may not recommend any screening for nonpregnant women who are considered low risk for pelvic cancer and who do not have symptoms. Ask your health care provider if a screening pelvic exam is right for you.  If you have had past treatment for cervical cancer or a condition that could lead to cancer, you need Pap tests and screening for cancer for at least 20 years after your treatment. If Pap tests have been discontinued, your risk factors (such as having a new sexual partner) need to be reassessed to determine if screening should resume. Some women have medical problems that increase the chance of getting cervical cancer. In these cases, your health care provider may recommend more frequent screening and Pap tests. Colorectal Cancer  This type of cancer can be detected and often prevented.  Routine colorectal cancer screening usually begins at 68 years of age and continues through 68 years of age.  Your health care provider may recommend screening at an earlier age if you have risk factors for colon cancer.  Your health care provider may also recommend using home test kits to check for hidden blood in the stool.  A small camera at the end of a tube can be used to examine your colon directly (sigmoidoscopy or colonoscopy).  This is done to check for the earliest forms of colorectal cancer.  Routine screening usually begins at age 70.  Direct examination of the colon should be repeated every 5-10 years through 68 years of age. However, you may need to be screened more often if early forms of precancerous polyps or small growths are found. Skin Cancer  Check your skin from head to toe regularly.  Tell your health care provider about any new moles or changes in moles, especially if there is a change in a mole's shape or color.  Also tell your health care provider if you have a mole that is larger than the size of a pencil eraser.  Always use sunscreen. Apply sunscreen liberally and repeatedly throughout the day.  Protect yourself by wearing long sleeves, pants, a wide-brimmed hat, and sunglasses whenever you are outside. HEART DISEASE, DIABETES, AND HIGH BLOOD PRESSURE   High blood pressure causes heart disease and increases the risk of stroke. High blood pressure is  more likely to develop in:  People who have blood pressure in the high end of the normal range (130-139/85-89 mm Hg).  People who are overweight or obese.  People who are African American.  If you are 11-85 years of age, have your blood pressure checked every 3-5 years. If you are 84 years of age or older, have your blood pressure checked every year. You should have your blood pressure measured twice--once when you are at a hospital or clinic, and once when you are not at a hospital or clinic. Record the average of the two measurements. To check your blood pressure when you are not at a hospital or clinic, you can use:  An automated blood pressure machine at a pharmacy.  A home blood pressure monitor.  If you are between 23 years and 12 years old, ask your health care provider if you should take aspirin to prevent strokes.  Have regular diabetes screenings. This involves taking a blood sample to check your fasting blood sugar level.  If you  are at a normal weight and have a low risk for diabetes, have this test once every three years after 68 years of age.  If you are overweight and have a high risk for diabetes, consider being tested at a younger age or more often. PREVENTING INFECTION  Hepatitis B  If you have a higher risk for hepatitis B, you should be screened for this virus. You are considered at high risk for hepatitis B if:  You were born in a country where hepatitis B is common. Ask your health care provider which countries are considered high risk.  Your parents were born in a high-risk country, and you have not been immunized against hepatitis B (hepatitis B vaccine).  You have HIV or AIDS.  You use needles to inject street drugs.  You live with someone who has hepatitis B.  You have had sex with someone who has hepatitis B.  You get hemodialysis treatment.  You take certain medicines for conditions, including cancer, organ transplantation, and autoimmune conditions. Hepatitis C  Blood testing is recommended for:  Everyone born from [birth year] through 1963.  Anyone with known risk factors for hepatitis C. Sexually transmitted infections (STIs)  You should be screened for sexually transmitted infections (STIs) including gonorrhea and chlamydia if:  You are sexually active and are younger than 68 years of age.  You are older than 68 years of age and your health care provider tells you that you are at risk for this type of infection.  Your sexual activity has changed since you were last screened and you are at an increased risk for chlamydia or gonorrhea. Ask your health care provider if you are at risk.  If you do not have HIV, but are at risk, it may be recommended that you take a prescription medicine daily to prevent HIV infection. This is called pre-exposure prophylaxis (PrEP). You are considered at risk if:  You are sexually active and do not regularly use condoms or know the HIV status of your  partner(s).  You take drugs by injection.  You are sexually active with a partner who has HIV. Talk with your health care provider about whether you are at high risk of being infected with HIV. If you choose to begin PrEP, you should first be tested for HIV. You should then be tested every 3 months for as long as you are taking PrEP.  PREGNANCY   If you are premenopausal and you  may become pregnant, ask your health care provider about preconception counseling.  If you may become pregnant, take 400 to 800 micrograms (mcg) of folic acid every day.  If you want to prevent pregnancy, talk to your health care provider about birth control (contraception). OSTEOPOROSIS AND MENOPAUSE   Osteoporosis is a disease in which the bones lose minerals and strength with aging. This can result in serious bone fractures. Your risk for osteoporosis can be identified using a bone density scan.  If you are 75 years of age or older, or if you are at risk for osteoporosis and fractures, ask your health care provider if you should be screened.  Ask your health care provider whether you should take a calcium or vitamin D supplement to lower your risk for osteoporosis.  Menopause may have certain physical symptoms and risks.  Hormone replacement therapy may reduce some of these symptoms and risks. Talk to your health care provider about whether hormone replacement therapy is right for you.  HOME CARE INSTRUCTIONS   Schedule regular health, dental, and eye exams.  Stay current with your immunizations.   Do not use any tobacco products including cigarettes, chewing tobacco, or electronic cigarettes.  If you are pregnant, do not drink alcohol.  If you are breastfeeding, limit how much and how often you drink alcohol.  Limit alcohol intake to no more than 1 drink per day for nonpregnant women. One drink equals 12 ounces of beer, 5 ounces of wine, or 1 ounces of hard liquor.  Do not use street drugs.  Do  not share needles.  Ask your health care provider for help if you need support or information about quitting drugs.  Tell your health care provider if you often feel depressed.  Tell your health care provider if you have ever been abused or do not feel safe at home.   This information is not intended to replace advice given to you by your health care provider. Make sure you discuss any questions you have with your health care provider.   Document Released: 07/30/2010 Document Revised: 02/04/2014 Document Reviewed: 12/16/2012 Elsevier Interactive Patient Education [employer].

## 2015-10-20 LAB — HEPATITIS C ANTIBODY: HCV Ab: NEGATIVE

## 2015-10-20 NOTE — Assessment & Plan Note (Signed)
Checking CMP, adjust as needed. BP at goal on her hctz and nadolol.

## 2015-10-20 NOTE — Assessment & Plan Note (Signed)
Colonoscopy up to date, hep c screening checked today. Given flu shot to update her vaccines. Declines shingles today. Checking labs. Counseled on sun safety and mole surveillance. Counseled on the dangers of distracted driving. Given 10 year screening recommendations.

## 2015-10-20 NOTE — Assessment & Plan Note (Signed)
Prilosec controlling symptoms.

## 2015-10-23 ENCOUNTER — Encounter: Payer: Federal, State, Local not specified - PPO | Admitting: Internal Medicine

## 2015-10-30 ENCOUNTER — Other Ambulatory Visit: Payer: Self-pay | Admitting: Internal Medicine

## 2015-11-27 ENCOUNTER — Other Ambulatory Visit: Payer: Self-pay | Admitting: Internal Medicine

## 2016-05-30 ENCOUNTER — Other Ambulatory Visit: Payer: Self-pay | Admitting: Internal Medicine

## 2016-05-30 DIAGNOSIS — Z1231 Encounter for screening mammogram for malignant neoplasm of breast: Secondary | ICD-10-CM

## 2016-07-08 ENCOUNTER — Ambulatory Visit: Payer: Federal, State, Local not specified - PPO

## 2016-07-19 ENCOUNTER — Ambulatory Visit
Admission: RE | Admit: 2016-07-19 | Discharge: 2016-07-19 | Disposition: A | Discharge status: Home / Self Care | Payer: Medicare Other | Source: Ambulatory Visit | Attending: Internal Medicine | Admitting: Internal Medicine

## 2016-07-19 DIAGNOSIS — Z1231 Encounter for screening mammogram for malignant neoplasm of breast: Secondary | ICD-10-CM | POA: Diagnosis not present

## 2016-08-09 ENCOUNTER — Other Ambulatory Visit: Payer: Self-pay | Admitting: Internal Medicine

## 2016-08-22 ENCOUNTER — Other Ambulatory Visit: Payer: Self-pay | Admitting: Internal Medicine

## 2016-10-25 ENCOUNTER — Telehealth: Payer: Self-pay

## 2016-10-25 ENCOUNTER — Other Ambulatory Visit (INDEPENDENT_AMBULATORY_CARE_PROVIDER_SITE_OTHER): Payer: Medicare Other

## 2016-10-25 ENCOUNTER — Encounter: Payer: Self-pay | Admitting: Internal Medicine

## 2016-10-25 ENCOUNTER — Ambulatory Visit (INDEPENDENT_AMBULATORY_CARE_PROVIDER_SITE_OTHER): Payer: Medicare Other | Admitting: Internal Medicine

## 2016-10-25 VITALS — BP 120/80 | HR 52 | Temp 98.0°F | Ht 62.5 in | Wt 145.0 lb

## 2016-10-25 DIAGNOSIS — K2100 Gastro-esophageal reflux disease with esophagitis, without bleeding: Secondary | ICD-10-CM

## 2016-10-25 DIAGNOSIS — Z01419 Encounter for gynecological examination (general) (routine) without abnormal findings: Secondary | ICD-10-CM

## 2016-10-25 DIAGNOSIS — I10 Essential (primary) hypertension: Secondary | ICD-10-CM | POA: Diagnosis not present

## 2016-10-25 DIAGNOSIS — R7301 Impaired fasting glucose: Secondary | ICD-10-CM

## 2016-10-25 DIAGNOSIS — Z0000 Encounter for general adult medical examination without abnormal findings: Secondary | ICD-10-CM | POA: Diagnosis not present

## 2016-10-25 DIAGNOSIS — E2839 Other primary ovarian failure: Secondary | ICD-10-CM

## 2016-10-25 DIAGNOSIS — M8589 Other specified disorders of bone density and structure, multiple sites: Secondary | ICD-10-CM

## 2016-10-25 DIAGNOSIS — Z23 Encounter for immunization: Secondary | ICD-10-CM | POA: Diagnosis not present

## 2016-10-25 LAB — COMPREHENSIVE METABOLIC PANEL
ALT: 13 U/L (ref 0–35)
AST: 17 U/L (ref 0–37)
Albumin: 3.7 g/dL (ref 3.5–5.2)
Alkaline Phosphatase: 68 U/L (ref 39–117)
BUN: 14 mg/dL (ref 6–23)
CHLORIDE: 100 meq/L (ref 96–112)
CO2: 32 meq/L (ref 19–32)
CREATININE: 0.86 mg/dL (ref 0.40–1.20)
Calcium: 9 mg/dL (ref 8.4–10.5)
GFR: 84.16 mL/min (ref >60.00)
Glucose, Bld: 101 mg/dL — ABNORMAL HIGH (ref 70–99)
POTASSIUM: 3.3 meq/L — AB (ref 3.5–5.1)
SODIUM: 139 meq/L (ref 135–145)
Total Bilirubin: 0.5 mg/dL (ref 0.2–1.2)
Total Protein: 6.7 g/dL (ref 6.0–8.3)

## 2016-10-25 LAB — LIPID PANEL
CHOL/HDL RATIO: 3
Cholesterol: 175 mg/dL (ref 0–200)
HDL: 69.3 mg/dL (ref >39.00)
LDL CALC: 96 mg/dL (ref 0–99)
NonHDL: 106.18
Triglycerides: 50 mg/dL (ref 0.0–149.0)
VLDL: 10 mg/dL (ref 0.0–40.0)

## 2016-10-25 LAB — CBC
HEMATOCRIT: 36.8 % (ref 36.0–46.0)
HEMOGLOBIN: 11.9 g/dL — AB (ref 12.0–15.0)
MCHC: 32.5 g/dL (ref 30.0–36.0)
MCV: 82.8 fl (ref 78.0–100.0)
Platelets: 345 10*3/uL (ref 150.0–400.0)
RBC: 4.44 Mil/uL (ref 3.87–5.11)
RDW: 14.2 % (ref 11.5–15.5)
WBC: 6 10*3/uL (ref 4.0–10.5)

## 2016-10-25 LAB — HEMOGLOBIN A1C: HEMOGLOBIN A1C: 6.2 % (ref 4.6–6.5)

## 2016-10-25 NOTE — Addendum Note (Signed)
Addended by: Berton Lan R on: 10/25/2016 08:57 AM   Modules accepted: Orders

## 2016-10-25 NOTE — Assessment & Plan Note (Signed)
Declines tdap today, flu shot given at visit. Pneumonia up to date. Counseled about shingrix. Counseled about sun safety and mole surveillance as well as dangers of distracted driving. Given 10 year screening recommendations. Ordered DEXA, Colonoscopy and mammogram up to date.

## 2016-10-25 NOTE — Assessment & Plan Note (Signed)
BP at goal on her hctz and nadolol. Checking CMP and adjust as needed.

## 2016-10-25 NOTE — Patient Instructions (Signed)
We will check the labs today and call you back about the results.   Health Maintenance, Female Adopting a healthy lifestyle and getting preventive care can go a long way to promote health and wellness. Talk with your health care provider about what schedule of regular examinations is right for you. This is a good chance for you to check in with your provider about disease prevention and staying healthy. In between checkups, there are plenty of things you can do on your own. Experts have done a lot of research about which lifestyle changes and preventive measures are most likely to keep you healthy. Ask your health care provider for more information. Weight and diet Eat a healthy diet  Be sure to include plenty of vegetables, fruits, low-fat dairy products, and lean protein.  Do not eat a lot of foods high in solid fats, added sugars, or salt.  Get regular exercise. This is one of the most important things you can do for your health. ? Most adults should exercise for at least 150 minutes each week. The exercise should increase your heart rate and make you sweat (moderate-intensity exercise). ? Most adults should also do strengthening exercises at least twice a week. This is in addition to the moderate-intensity exercise.  Maintain a healthy weight  Body mass index (BMI) is a measurement that can be used to identify possible weight problems. It estimates body fat based on height and weight. Your health care provider can help determine your BMI and help you achieve or maintain a healthy weight.  For females 20 years of age and older: ? A BMI below 18.5 is considered underweight. ? A BMI of 18.5 to 24.9 is normal. ? A BMI of 25 to 29.9 is considered overweight. ? A BMI of 30 and above is considered obese.  Watch levels of cholesterol and blood lipids  You should start having your blood tested for lipids and cholesterol at 69 years of age, then have this test every 5 years.  You may need to  have your cholesterol levels checked more often if: ? Your lipid or cholesterol levels are high. ? You are older than 69 years of age. ? You are at high risk for heart disease.  Cancer screening Lung Cancer  Lung cancer screening is recommended for adults 55-80 years old who are at high risk for lung cancer because of a history of smoking.  A yearly low-dose CT scan of the lungs is recommended for people who: ? Currently smoke. ? Have quit within the past 15 years. ? Have at least a 30-pack-year history of smoking. A pack year is smoking an average of one pack of cigarettes a day for 1 year.  Yearly screening should continue until it has been 15 years since you quit.  Yearly screening should stop if you develop a health problem that would prevent you from having lung cancer treatment.  Breast Cancer  Practice breast self-awareness. This means understanding how your breasts normally appear and feel.  It also means doing regular breast self-exams. Let your health care provider know about any changes, no matter how small.  If you are in your 20s or 30s, you should have a clinical breast exam (CBE) by a health care provider every 1-3 years as part of a regular health exam.  If you are 40 or older, have a CBE every year. Also consider having a breast X-ray (mammogram) every year.  If you have a family history of breast cancer, talk   to your health care provider about genetic screening.  If you are at high risk for breast cancer, talk to your health care provider about having an MRI and a mammogram every year.  Breast cancer gene (BRCA) assessment is recommended for women who have family members with BRCA-related cancers. BRCA-related cancers include: ? Breast. ? Ovarian. ? Tubal. ? Peritoneal cancers.  Results of the assessment will determine the need for genetic counseling and BRCA1 and BRCA2 testing.  Cervical Cancer Your health care provider may recommend that you be screened  regularly for cancer of the pelvic organs (ovaries, uterus, and vagina). This screening involves a pelvic examination, including checking for microscopic changes to the surface of your cervix (Pap test). You may be encouraged to have this screening done every 3 years, beginning at age 21.  For women ages 30-65, health care providers may recommend pelvic exams and Pap testing every 3 years, or they may recommend the Pap and pelvic exam, combined with testing for human papilloma virus (HPV), every 5 years. Some types of HPV increase your risk of cervical cancer. Testing for HPV may also be done on women of any age with unclear Pap test results.  Other health care providers may not recommend any screening for nonpregnant women who are considered low risk for pelvic cancer and who do not have symptoms. Ask your health care provider if a screening pelvic exam is right for you.  If you have had past treatment for cervical cancer or a condition that could lead to cancer, you need Pap tests and screening for cancer for at least 20 years after your treatment. If Pap tests have been discontinued, your risk factors (such as having a new sexual partner) need to be reassessed to determine if screening should resume. Some women have medical problems that increase the chance of getting cervical cancer. In these cases, your health care provider may recommend more frequent screening and Pap tests.  Colorectal Cancer  This type of cancer can be detected and often prevented.  Routine colorectal cancer screening usually begins at 69 years of age and continues through 69 years of age.  Your health care provider may recommend screening at an earlier age if you have risk factors for colon cancer.  Your health care provider may also recommend using home test kits to check for hidden blood in the stool.  A small camera at the end of a tube can be used to examine your colon directly (sigmoidoscopy or colonoscopy). This is  done to check for the earliest forms of colorectal cancer.  Routine screening usually begins at age 50.  Direct examination of the colon should be repeated every 5-10 years through 69 years of age. However, you may need to be screened more often if early forms of precancerous polyps or small growths are found.  Skin Cancer  Check your skin from head to toe regularly.  Tell your health care provider about any new moles or changes in moles, especially if there is a change in a mole's shape or color.  Also tell your health care provider if you have a mole that is larger than the size of a pencil eraser.  Always use sunscreen. Apply sunscreen liberally and repeatedly throughout the day.  Protect yourself by wearing long sleeves, pants, a wide-brimmed hat, and sunglasses whenever you are outside.  Heart disease, diabetes, and high blood pressure  High blood pressure causes heart disease and increases the risk of stroke. High blood pressure is more   likely to develop in: ? People who have blood pressure in the high end of the normal range (130-139/85-89 mm Hg). ? People who are overweight or obese. ? People who are African American.  If you are 90-95 years of age, have your blood pressure checked every 3-5 years. If you are 4 years of age or older, have your blood pressure checked every year. You should have your blood pressure measured twice-once when you are at a hospital or clinic, and once when you are not at a hospital or clinic. Record the average of the two measurements. To check your blood pressure when you are not at a hospital or clinic, you can use: ? An automated blood pressure machine at a pharmacy. ? A home blood pressure monitor.  If you are between 44 years and 21 years old, ask your health care provider if you should take aspirin to prevent strokes.  Have regular diabetes screenings. This involves taking a blood sample to check your fasting blood sugar level. ? If you are  at a normal weight and have a low risk for diabetes, have this test once every three years after 69 years of age. ? If you are overweight and have a high risk for diabetes, consider being tested at a younger age or more often. Preventing infection Hepatitis B  If you have a higher risk for hepatitis B, you should be screened for this virus. You are considered at high risk for hepatitis B if: ? You were born in a country where hepatitis B is common. Ask your health care provider which countries are considered high risk. ? Your parents were born in a high-risk country, and you have not been immunized against hepatitis B (hepatitis B vaccine). ? You have HIV or AIDS. ? You use needles to inject street drugs. ? You live with someone who has hepatitis B. ? You have had sex with someone who has hepatitis B. ? You get hemodialysis treatment. ? You take certain medicines for conditions, including cancer, organ transplantation, and autoimmune conditions.  Hepatitis C  Blood testing is recommended for: ? Everyone born from [birth year] through 1963. ? Anyone with known risk factors for hepatitis C.  Sexually transmitted infections (STIs)  You should be screened for sexually transmitted infections (STIs) including gonorrhea and chlamydia if: ? You are sexually active and are younger than 69 years of age. ? You are older than 69 years of age and your health care provider tells you that you are at risk for this type of infection. ? Your sexual activity has changed since you were last screened and you are at an increased risk for chlamydia or gonorrhea. Ask your health care provider if you are at risk.  If you do not have HIV, but are at risk, it may be recommended that you take a prescription medicine daily to prevent HIV infection. This is called pre-exposure prophylaxis (PrEP). You are considered at risk if: ? You are sexually active and do not regularly use condoms or know the HIV status of your  partner(s). ? You take drugs by injection. ? You are sexually active with a partner who has HIV.  Talk with your health care provider about whether you are at high risk of being infected with HIV. If you choose to begin PrEP, you should first be tested for HIV. You should then be tested every 3 months for as long as you are taking PrEP. Pregnancy  If you are premenopausal and you may  become pregnant, ask your health care provider about preconception counseling.  If you may become pregnant, take 400 to 800 micrograms (mcg) of folic acid every day.  If you want to prevent pregnancy, talk to your health care provider about birth control (contraception). Osteoporosis and menopause  Osteoporosis is a disease in which the bones lose minerals and strength with aging. This can result in serious bone fractures. Your risk for osteoporosis can be identified using a bone density scan.  If you are 65 years of age or older, or if you are at risk for osteoporosis and fractures, ask your health care provider if you should be screened.  Ask your health care provider whether you should take a calcium or vitamin D supplement to lower your risk for osteoporosis.  Menopause may have certain physical symptoms and risks.  Hormone replacement therapy may reduce some of these symptoms and risks. Talk to your health care provider about whether hormone replacement therapy is right for you. Follow these instructions at home:  Schedule regular health, dental, and eye exams.  Stay current with your immunizations.  Do not use any tobacco products including cigarettes, chewing tobacco, or electronic cigarettes.  If you are pregnant, do not drink alcohol.  If you are breastfeeding, limit how much and how often you drink alcohol.  Limit alcohol intake to no more than 1 drink per day for nonpregnant women. One drink equals 12 ounces of beer, 5 ounces of wine, or 1 ounces of hard liquor.  Do not use street  drugs.  Do not share needles.  Ask your health care provider for help if you need support or information about quitting drugs.  Tell your health care provider if you often feel depressed.  Tell your health care provider if you have ever been abused or do not feel safe at home. This information is not intended to replace advice given to you by your health care provider. Make sure you discuss any questions you have with your health care provider. Document Released: 07/30/2010 Document Revised: 06/22/2015 Document Reviewed: 10/18/2014 Elsevier Interactive Patient Education  2018 [employer]  

## 2016-10-25 NOTE — Assessment & Plan Note (Signed)
Takes omeprazole prn for symptoms. Controlled mostly by diet currently. Refilled omeprazole today.

## 2016-10-25 NOTE — Telephone Encounter (Signed)
error 

## 2016-10-25 NOTE — Progress Notes (Signed)
   Subjective:    Patient ID: Kayla Key, female    DOB: 05/18/1945, 71 y.o.   MRN: [id number]  HPI Here for medicare wellness, no new complaints. Please see A/P for status and treatment of chronic medical problems.   HPI #2: Here for follow up of her blood pressure (taking hctz and nadolol, denies side effects, BP at goal, denies headaches or chest pains or SOB), and her GERD (taking omeprazole as needed, symptoms are better lately, no worsening in the last year), and her osteopenia (last DEXA 2016,taking calcium and vitamin D, does weight bearing exercise sometimes).   Diet: heart healthy Physical activity: sedentary Depression/mood screen: negative Hearing: intact to whispered voice Visual acuity: grossly normal with lens, performs annual eye exam  ADLs: capable Fall risk: none Home safety: good Cognitive evaluation: intact to orientation, naming, recall and repetition EOL planning: adv directives discussed  I have personally reviewed and have noted 1. The patient's medical and social history - reviewed today no changes 2. Their use of alcohol, tobacco or illicit drugs 3. Their current medications and supplements 4. The patient's functional ability including ADL's, fall risks, home safety risks and hearing or visual impairment. 5. Diet and physical activities 6. Evidence for depression or mood disorders 7. Care team reviewed and updated (available in snapshot)  Review of Systems  Constitutional: Negative.   HENT: Negative.   Eyes: Negative.   Respiratory: Negative for cough, chest tightness and shortness of breath.   Cardiovascular: Negative for chest pain, palpitations and leg swelling.  Gastrointestinal: Negative for abdominal distention, abdominal pain, constipation, diarrhea, nausea and vomiting.  Musculoskeletal: Negative.   Skin: Negative.   Neurological: Negative.   Psychiatric/Behavioral: Negative.       Objective:   Physical Exam  Constitutional: She is  oriented to person, place, and time. She appears well-developed and well-nourished.  HENT:  Head: Normocephalic and atraumatic.  Eyes: EOM are normal.  Neck: Normal range of motion.  Cardiovascular: Normal rate and regular rhythm.   Pulmonary/Chest: Effort normal and breath sounds normal. No respiratory distress. She has no wheezes. She has no rales.  Abdominal: Soft. Bowel sounds are normal. She exhibits no distension. There is no tenderness. There is no rebound.  Musculoskeletal: She exhibits no edema.  Neurological: She is alert and oriented to person, place, and time. Coordination normal.  Skin: Skin is warm and dry.  Psychiatric: She has a normal mood and affect.   Vitals:   10/25/16 0800  BP: 120/80  Pulse: (!) 52  Temp: 98 F (36.7 C)  TempSrc: Oral  SpO2: 100%  Weight: 145 lb (65.8 kg)  Height: 5' 2.5" (1.588 m)      Assessment & Plan:  Flu shot given at visit

## 2016-10-25 NOTE — Assessment & Plan Note (Signed)
DEXA 2016 with severe osteopenia and needs recheck this year. Ordered today. Does weight bearing exercise and taking calcium and vitamin D. Denies falls or fractures.

## 2016-11-04 ENCOUNTER — Other Ambulatory Visit: Payer: Self-pay | Admitting: Family

## 2016-11-13 ENCOUNTER — Encounter: Payer: Self-pay | Admitting: Family Medicine

## 2016-11-13 ENCOUNTER — Ambulatory Visit (INDEPENDENT_AMBULATORY_CARE_PROVIDER_SITE_OTHER): Payer: Medicare Other | Admitting: Family Medicine

## 2016-11-13 VITALS — BP 122/78 | HR 57 | Temp 98.2°F | Ht 62.5 in | Wt 147.0 lb

## 2016-11-13 DIAGNOSIS — M79672 Pain in left foot: Secondary | ICD-10-CM | POA: Diagnosis not present

## 2016-11-13 NOTE — Assessment & Plan Note (Addendum)
Does not appear to have a specific fracture but likely a stress reaction that is occurring. Does have a recent bone scan that shows osteoporosis. - advised to wear a post op shoe for 2-3 weeks - can use ice and tylenol  - if not healing may need to add 50,000 vitamin D at follow up. - if no improvement could consider an xray

## 2016-11-13 NOTE — Progress Notes (Signed)
Kayla Key - 71 y.o. female MRN [id number]  Date of birth: April 24, 1945  SUBJECTIVE:  Including CC & ROS.  Chief Complaint  Patient presents with  . Foot Pain    patient states foot was hurting really bad for a couple of weeks and then it started swelling about a week ago. patient states the swelling usually happens at night or if she is being active     Kayla Key is a 72 yo F that is presenting with left foot pain and swelling. The pain and swelling is occurring between the second and third metatarsal on the left foot. The pain is on the dorsal aspect. Having some swelling over this area. The pain seems to be worse the more she walks. Has been present for 2 weeks. Is localized to this area. Has a history of osteoporosis as well as hairline fractures in her feet. Has been icing and using Tylenol for the pain.  She has a history of bunionectomy and surgery on the fourth and fifth digit of the left foot.   Review of the bone density from 11/18/14 shows osteoporosis. Has mild anemia on her last blood work from 9/28. Has an A1c from 9/28 that is 6.2.  Review of Systems  Constitutional: Negative for fever.  Musculoskeletal: Positive for gait problem. Negative for joint swelling.  Skin: Negative for color change.  Neurological: Negative for weakness and numbness.    HISTORY: Past Medical, Surgical, Social, and Family History Reviewed & Updated per EMR.   Pertinent Historical Findings include:  Past Medical History:  Diagnosis Date  . Arthritis   . Asthma   . GERD (gastroesophageal reflux disease)   . Hypertension   . MHA (microangiopathic hemolytic anemia) (HCC)   . Osteoporosis     Past Surgical History:  Procedure Laterality Date  . BUNIONECTOMY Right   . KNEE ARTHROSCOPY Right 2009  . KNEE ARTHROSCOPY Left 2015  . TOE SURGERY Bilateral 2005  . TUBAL LIGATION  1988    No Known Allergies  Family History  Problem Relation Age of Onset  . Stroke Father   . Hypertension  Sister   . Cancer Other        breast   . Colon cancer Neg Hx   . Breast cancer Neg Hx      Social History   Social History  . Marital status: Widowed    Spouse name: N/A  . Number of children: N/A  . Years of education: N/A   Occupational History  . Not on file.   Social History Main Topics  . Smoking status: Never Smoker  . Smokeless tobacco: Never Used  . Alcohol use No  . Drug use: No  . Sexual activity: Not on file   Other Topics Concern  . Not on file   Social History Narrative  . No narrative on file     PHYSICAL EXAM:  VS: BP 122/78 (BP Location: Left Arm, Patient Position: Sitting, Cuff Size: Normal)   Pulse (!) 57   Temp 98.2 F (36.8 C) (Oral)   Ht 5' 2.5" (1.588 m)   Wt 147 lb (66.7 kg)   SpO2 98%   BMI 26.46 kg/m  Physical Exam Gen: NAD, alert, cooperative with exam, well-appearing ENT: normal lips, normal nasal mucosa,  Eye: normal EOM, normal conjunctiva and lids CV:  no edema, +2 pedal pulses   Resp: no accessory muscle use, non-labored,  Skin: no rashes, no areas of induration  Neuro: normal tone, normal  sensation to touch Psych:  normal insight, alert and oriented MSK:  Left foot: Swelling over the dorsal compartment between the second and third metatarsal.  Tenderness to palpation over the second and third metatarsal. Normal ankle range of motion. Normal foot strength to resistance. Slight limp with ambulation. No abnormal callus or ulcer formation. Neurovascularly intact.  Limited ultrasound: Left foot:  Hypoechoic change in the second and third and to be joint as well as between the second and third metatarsals to suggest a stress reaction. No obvious fracture in this area.   Summary: Findings are likely associated with stress reaction  Ultrasound and interpretation by Kayla GandyJeremy Schmitz, MD               ASSESSMENT & PLAN:   Left foot pain Does not appear to have a specific fracture but likely a stress reaction  that is occurring. Does have a recent bone scan that shows osteoporosis. - advised to wear a post op shoe for 2-3 weeks - can use ice and tylenol  - if not healing may need to add 50,000 vitamin D at follow up. - if no improvement could consider an xray

## 2016-11-13 NOTE — Patient Instructions (Addendum)
Thank you for coming in,   Please wear the post op shoe and try this for 2-3 weeks.   Please try ice and tylenol   Please try getting a cushioned orthotic for your tennis shoes after this has resolved.    Please feel free to call with any questions or concerns at any time, at [phone]. --Dr. Jordan LikesSchmitz

## 2016-11-27 ENCOUNTER — Ambulatory Visit (INDEPENDENT_AMBULATORY_CARE_PROVIDER_SITE_OTHER)
Admission: RE | Admit: 2016-11-27 | Discharge: 2016-11-27 | Disposition: A | Discharge status: Home / Self Care | Payer: Medicare Other | Source: Ambulatory Visit

## 2016-11-27 DIAGNOSIS — E2839 Other primary ovarian failure: Secondary | ICD-10-CM | POA: Diagnosis not present

## 2016-12-13 ENCOUNTER — Other Ambulatory Visit: Payer: Self-pay

## 2016-12-13 MED ORDER — NADOLOL 20 MG PO TABS: ORAL_TABLET | ORAL | 2 refills | Status: DC

## 2016-12-30 ENCOUNTER — Other Ambulatory Visit: Payer: Self-pay | Admitting: Family

## 2017-01-10 ENCOUNTER — Other Ambulatory Visit: Payer: Self-pay | Admitting: Internal Medicine

## 2017-02-28 ENCOUNTER — Telehealth: Payer: Self-pay | Admitting: Internal Medicine

## 2017-02-28 NOTE — Telephone Encounter (Signed)
I'm confused, this patient listed is [id number] and already my patient. What is the question please clarify jarbled note?

## 2017-02-28 NOTE — Telephone Encounter (Signed)
Copied from CRM [phone]. Topic: Appointment Scheduling - Scheduling Inquiry for Clinic >> Feb 28, 2017  1:03 PM Crist InfanteHarrald, Kathy J wrote: Reason for CRM: pt and her sister Dr Okey Duprerawford.  Pt's mother who is [phone] just moved here and in with her.  Only health issues is dementia. Pt aware Dr Okey Duprerawford not accepting new pts. Is hoping Dr Okey Duprerawford will make an exception and accept her mom as a pt.   Dr.Crawford are you ok with this?

## 2017-02-28 NOTE — Telephone Encounter (Signed)
Oh you are right. They sent this over in your patients chart. This should be for her mother. I believe she is requesting for her mother to be your patient who is [phone].

## 2017-03-04 NOTE — Telephone Encounter (Signed)
Fine to establish the mother

## 2017-03-05 NOTE — Telephone Encounter (Signed)
Appointment has been made for 03-25-17

## 2017-05-23 ENCOUNTER — Telehealth: Payer: Self-pay | Admitting: Internal Medicine

## 2017-05-23 NOTE — Telephone Encounter (Signed)
Patient informed the forms are complete and ready to be picked up.  Copy sent to scan.

## 2017-05-23 NOTE — Telephone Encounter (Signed)
Patient last CPE was on 10/25/17, Form has been placed in your box to review and advise on. Thank you.

## 2017-05-23 NOTE — Telephone Encounter (Signed)
Done

## 2017-05-23 NOTE — Telephone Encounter (Signed)
[facility].  Please call pt once form is completed and ready for pick up [phone].  Form put in Brittany's box.

## 2017-06-30 ENCOUNTER — Other Ambulatory Visit: Payer: Self-pay | Admitting: Internal Medicine

## 2017-06-30 DIAGNOSIS — Z1231 Encounter for screening mammogram for malignant neoplasm of breast: Secondary | ICD-10-CM

## 2017-07-22 ENCOUNTER — Ambulatory Visit
Admission: RE | Admit: 2017-07-22 | Discharge: 2017-07-22 | Disposition: A | Discharge status: Home / Self Care | Payer: Medicare Other | Source: Ambulatory Visit | Attending: Internal Medicine | Admitting: Internal Medicine

## 2017-07-22 DIAGNOSIS — Z1231 Encounter for screening mammogram for malignant neoplasm of breast: Secondary | ICD-10-CM | POA: Diagnosis not present

## 2017-09-18 ENCOUNTER — Ambulatory Visit (INDEPENDENT_AMBULATORY_CARE_PROVIDER_SITE_OTHER): Payer: Medicare Other | Admitting: Internal Medicine

## 2017-09-18 ENCOUNTER — Encounter: Payer: Self-pay | Admitting: Internal Medicine

## 2017-09-18 ENCOUNTER — Ambulatory Visit (INDEPENDENT_AMBULATORY_CARE_PROVIDER_SITE_OTHER)
Admission: RE | Admit: 2017-09-18 | Discharge: 2017-09-18 | Disposition: A | Discharge status: Home / Self Care | Payer: Medicare Other | Source: Ambulatory Visit | Attending: Internal Medicine | Admitting: Internal Medicine

## 2017-09-18 VITALS — BP 120/80 | HR 42 | Temp 98.3°F | Ht 62.5 in | Wt 147.0 lb

## 2017-09-18 DIAGNOSIS — M25562 Pain in left knee: Secondary | ICD-10-CM | POA: Diagnosis not present

## 2017-09-18 DIAGNOSIS — T8484XA Pain due to internal orthopedic prosthetic devices, implants and grafts, initial encounter: Secondary | ICD-10-CM | POA: Insufficient documentation

## 2017-09-18 DIAGNOSIS — G8929 Other chronic pain: Secondary | ICD-10-CM

## 2017-09-18 DIAGNOSIS — M1712 Unilateral primary osteoarthritis, left knee: Secondary | ICD-10-CM | POA: Diagnosis not present

## 2017-09-18 DIAGNOSIS — M1711 Unilateral primary osteoarthritis, right knee: Secondary | ICD-10-CM | POA: Diagnosis not present

## 2017-09-18 DIAGNOSIS — M25561 Pain in right knee: Secondary | ICD-10-CM

## 2017-09-18 NOTE — Patient Instructions (Signed)
We will check the x-ray of the knees and have you come back with the sports medicine doctor for shots in your knees.

## 2017-09-18 NOTE — Assessment & Plan Note (Signed)
Ordered x-ray left and right knee to evaluate for progression of arthritis. Referral to sports medicine for consideration of injections. Advised to try tylenol for pain. Has tried turmeric without any results.

## 2017-09-18 NOTE — Progress Notes (Signed)
   Subjective:    Patient ID: Kayla Key, female    DOB: 16-Apr-1945, 72 y.o.   MRN: [id number]  HPI The patient is a 72 YO female coming in for bilateral knee pain for about 3 months or so. She has had arthroscopy on both knees (right 2009, left 2014). This helped for some time. The pain is worse when walking up or down stairs. Also worse with sitting for prolonged time is stiff and hurting once she starts walking. She is able to walk but not as much as she would like. Denies falls or injury in the last few months.   Review of Systems  Constitutional: Positive for activity change. Negative for appetite change, chills, fatigue, fever and unexpected weight change.  Respiratory: Negative.   Cardiovascular: Negative.   Gastrointestinal: Negative.   Musculoskeletal: Positive for arthralgias and myalgias. Negative for back pain, gait problem and joint swelling.  Skin: Negative.   Neurological: Negative.       Objective:   Physical Exam  Constitutional: She is oriented to person, place, and time. She appears well-developed and well-nourished.  HENT:  Head: Normocephalic and atraumatic.  Eyes: EOM are normal.  Neck: Normal range of motion.  Cardiovascular: Normal rate and regular rhythm.  Pulmonary/Chest: Effort normal and breath sounds normal. No respiratory distress. She has no wheezes. She has no rales.  Musculoskeletal: She exhibits tenderness. She exhibits no edema.  Mild tenderness to touch knees bilaterally, ACL and PCL intact bilaterally.   Neurological: She is alert and oriented to person, place, and time. Coordination normal.  Skin: Skin is warm and dry.   Vitals:   09/18/17 0901  BP: 120/80  Pulse: (!) 42  Temp: 98.3 F (36.8 C)  TempSrc: Oral  SpO2: 99%  Weight: 147 lb (66.7 kg)  Height: 5' 2.5" (1.588 m)      Assessment & Plan:

## 2017-09-28 NOTE — Progress Notes (Signed)
[facility] [address] [place], [place] [address] Phone: [phone] Subjective:    I'm seeing this patient by the request  of:  Myrlene Broker, MD   I [name] am serving as a scribe for Dr. Antoine Primas.  CC: Bilateral knee pain  [id number]  Kayla Key is a 72 y.o. female coming in with complaint of bilateral knee pain. Has had surgery bilaterally. Has a history of back pain.  Onset- 3 months  Location-  Duration-  Character- sharp Aggravating factors- standing, stairs  Reliving factors-  Therapies tried- topicals Severity-8 out of 10 and worsening  Patient did have x-rays of the knees bilaterally.  These were independently visualized by me showing severe medial compartment arthritis.     Past Medical History:  Diagnosis Date  . Arthritis   . Asthma   . GERD (gastroesophageal reflux disease)   . Hypertension   . MHA (microangiopathic hemolytic anemia) (HCC)   . Osteoporosis    Past Surgical History:  Procedure Laterality Date  . BUNIONECTOMY Right   . KNEE ARTHROSCOPY Right 2009  . KNEE ARTHROSCOPY Left 2015  . TOE SURGERY Bilateral 2005  . TUBAL LIGATION  1988   Social History   Socioeconomic History  . Marital status: Widowed    Spouse name: Not on file  . Number of children: Not on file  . Years of education: Not on file  . Highest education level: Not on file  Occupational History  . Not on file  Social Needs  . Financial resource strain: Not on file  . Food insecurity:    Worry: Not on file    Inability: Not on file  . Transportation needs:    Medical: Not on file    Non-medical: Not on file  Tobacco Use  . Smoking status: Never Smoker  . Smokeless tobacco: Never Used  Substance and Sexual Activity  . Alcohol use: No    Alcohol/week: 0.0 standard drinks  . Drug use: No  . Sexual activity: Not on file  Lifestyle  . Physical activity:    Days per week: Not on file    Minutes per  session: Not on file  . Stress: Not on file  Relationships  . Social connections:    Talks on phone: Not on file    Gets together: Not on file    Attends religious service: Not on file    Active member of club or organization: Not on file    Attends meetings of clubs or organizations: Not on file    Relationship status: Not on file  Other Topics Concern  . Not on file  Social History Narrative  . Not on file   No Known Allergies Family History  Problem Relation Age of Onset  . Stroke Father   . Hypertension Sister   . Breast cancer Sister   . Cancer Other        breast   . Colon cancer Neg Hx      Current Outpatient Medications (Cardiovascular):  .  hydrochlorothiazide (HYDRODIURIL) 25 MG tablet, TAKE 1 TABLET BY MOUTH EVERY DAY *NEEDS OFFICE VISIT .  nadolol (CORGARD) 20 MG tablet, TAKE 1 TABLET (20 MG TOTAL) BY MOUTH DAILY.     Current Outpatient Medications (Other):  .  omeprazole (PRILOSEC) 20 MG capsule, TAKE 1 CAPSULE (20 MG TOTAL) BY MOUTH DAILY.    Past medical history, social, surgical and family history all reviewed in electronic medical record.  No pertanent information unless stated regarding to the chief complaint.   Review of Systems:  No headache, visual changes, nausea, vomiting, diarrhea, constipation, dizziness, abdominal pain, skin rash, fevers, chills, night sweats, weight loss, swollen lymph nodes, body aches,  chest pain, shortness of breath, mood changes.  Positive muscle aches and joint swelling  Objective  Blood pressure 100/64, pulse (!) 56, height 5' 2.5" (1.588 m), weight 146 lb (66.2 kg), SpO2 92 %.    General: No apparent distress alert and oriented x3 mood and affect normal, dressed appropriately.  HEENT: Pupils equal, extraocular movements intact  Respiratory: Patient's speak in full sentences and does not appear short of breath  Cardiovascular: No lower extremity edema, non tender, no erythema  Skin: Warm dry intact with no signs of  infection or rash on extremities or on axial skeleton.  Abdomen: Soft nontender  Neuro: Cranial nerves II through XII are intact, neurovascularly intact in all extremities with 2+ DTRs and 2+ pulses.  Lymph: No lymphadenopathy of posterior or anterior cervical chain or axillae bilaterally.  Gait antalgic MSK:  Non tender with full range of motion and good stability and symmetric strength and tone of shoulders, elbows, wrist, hip, and ankles bilaterally.   Knee: Bilateral valgus deformity noted.  Normal thigh to calf ratio.  Tender to palpation over medial and PF joint line.  ROM full in flexion and extension and lower leg rotation. instability with valgus force.  painful patellar compression. Patellar glide with moderate crepitus. Patellar and quadriceps tendons unremarkable. Hamstring and quadriceps strength is normal.   [id number]; 15 additional minutes spent for Therapeutic exercises as stated in above notes.  This included exercises focusing on stretching, strengthening, with significant focus on eccentric aspects.   Long term goals include an improvement in range of motion, strength, endurance as well as avoiding reinjury. Patient's frequency would include in 1-2 times a day, 3-5 times a week for a duration of 6-12 weeks.   Given rehab exercises handout for VMO, hip abductors, core, entire kinetic chain including proprioception exercises including cone touches, step downs, hip elevations and turn outs.  Could benefit from PT, regular exercise, upright biking, and a  Proper technique shown and discussed handout in great detail with ATC.  All questions were discussed and answered.      Impression and Recommendations:     This case required medical decision making of moderate complexity. The above documentation has been reviewed and is accurate and complete [name], DO       Note: This dictation was prepared with Dragon dictation along with smaller phrase technology. Any  transcriptional errors that result from this process are unintentional.

## 2017-09-30 ENCOUNTER — Encounter: Payer: Self-pay | Admitting: Family Medicine

## 2017-09-30 ENCOUNTER — Ambulatory Visit (INDEPENDENT_AMBULATORY_CARE_PROVIDER_SITE_OTHER): Payer: Medicare Other | Admitting: Family Medicine

## 2017-09-30 DIAGNOSIS — M170 Bilateral primary osteoarthritis of knee: Secondary | ICD-10-CM | POA: Insufficient documentation

## 2017-09-30 NOTE — Patient Instructions (Signed)
Good to see you  You do have good arthritis of the knees  Ice 20 minutes 2 times daily. Usually after activity and before bed. pennsaid pinkie amount topically 2 times daily as needed.  Exercises 3 times a week.  pennsaid pinkie amount topically 2 times daily as needed.  Over the counter get  Turmeric 500mg  2 times a day  Tart cherry extract any dose at night Vitamin D 2000 IU daily  Biking or elliptical could be better on the knees.  See me again in 4 weeks and if not a lot better we will consider injections and other tricks.

## 2017-09-30 NOTE — Assessment & Plan Note (Signed)
Patient does have moderate to severe osteoarthritic changes mostly of the medial compartment bilaterally.  These were seen on the x-rays previously.  Discussed with patient about icing regimen, home exercise, which activities to do which wants to avoid.  Topical anti-inflammatory trials given.  Discussed other over-the-counter supplementations.  Home exercise given and work with Event organiser.  Follow-up again in 4 weeks

## 2017-10-04 ENCOUNTER — Other Ambulatory Visit: Payer: Self-pay | Admitting: Internal Medicine

## 2017-10-27 ENCOUNTER — Other Ambulatory Visit: Payer: Self-pay | Admitting: Internal Medicine

## 2017-10-28 NOTE — Progress Notes (Signed)
Subjective:   Kayla Key is a 72 y.o. female who presents for Medicare Annual (Subsequent) preventive examination.  Review of Systems:  No ROS.  Medicare Wellness Visit. Additional risk factors are reflected in the social history.  Cardiac Risk Factors include: advanced age (>33men, >4 women);hypertension Sleep patterns: has difficulty falling asleep, gets up 1 times nightly to void and sleeps 5-6 hours nightly. Patient reports insomnia issues, discussed recommended sleep tips.   Home Safety/Smoke Alarms: Feels safe in home. Smoke alarms in place.  Living environment; residence and Firearm Safety: 1-story house/ trailer, no firearms. Lives with family, no needs for DME, good support system Seat Belt Safety/Bike Helmet: Wears seat belt.      Objective:     Vitals: BP 114/75   Pulse (!) 58   Resp 17   Ht 5\' 2"  (1.575 m)   Wt 145 lb (65.8 kg)   SpO2 100%   BMI 26.52 kg/m   Body mass index is 26.52 kg/m.  Advanced Directives 10/29/2017 04/06/2014  Does Patient Have a Medical Advance Directive? No No  Would patient like information on creating a medical advance directive? Yes (ED - Information included in AVS) -    Tobacco Social History   Tobacco Use  Smoking Status Never Smoker  Smokeless Tobacco Never Used     Counseling given: Not Answered  Past Medical History:  Diagnosis Date  . Arthritis   . Asthma   . GERD (gastroesophageal reflux disease)   . Hypertension   . MHA (microangiopathic hemolytic anemia) (HCC)   . Osteoporosis    Past Surgical History:  Procedure Laterality Date  . BUNIONECTOMY Right   . KNEE ARTHROSCOPY Right 2009  . KNEE ARTHROSCOPY Left 2015  . TOE SURGERY Bilateral 2005  . TUBAL LIGATION  1988   Family History  Problem Relation Age of Onset  . Stroke Father   . Hypertension Sister   . Breast cancer Sister   . Cancer Other        breast   . Colon cancer Neg Hx    Social History   Socioeconomic History  . Marital status:  Widowed    Spouse name: Not on file  . Number of children: Not on file  . Years of education: Not on file  . Highest education level: Not on file  Occupational History  . Not on file  Social Needs  . Financial resource strain: Not hard at all  . Food insecurity:    Worry: Never true    Inability: Never true  . Transportation needs:    Medical: No    Non-medical: No  Tobacco Use  . Smoking status: Never Smoker  . Smokeless tobacco: Never Used  Substance and Sexual Activity  . Alcohol use: No    Alcohol/week: 0.0 standard drinks  . Drug use: No  . Sexual activity: Not Currently  Lifestyle  . Physical activity:    Days per week: 5 days    Minutes per session: 40 min  . Stress: Only a little  Relationships  . Social connections:    Talks on phone: More than three times a week    Gets together: More than three times a week    Attends religious service: Not on file    Active member of club or organization: Not on file    Attends meetings of clubs or organizations: Not on file    Relationship status: Widowed  Other Topics Concern  . Not on file  Social History Narrative  . Not on file    Outpatient Encounter Medications as of 10/29/2017  Medication Sig  . hydrochlorothiazide (HYDRODIURIL) 25 MG tablet Take 1 tablet (25 mg total) by mouth daily. Overdue for annual appt must see provider for future refills  . Multiple Vitamin (MULTI-VITAMIN DAILY PO) Take 1 tablet by mouth daily.  . nadolol (CORGARD) 20 MG tablet TAKE 1 TABLET BY MOUTH EVERY DAY NEED annual appointment for further refills  . omeprazole (PRILOSEC) 20 MG capsule TAKE 1 CAPSULE (20 MG TOTAL) BY MOUTH DAILY.   No facility-administered encounter medications on file as of 10/29/2017.     Activities of Daily Living In your present state of health, do you have any difficulty performing the following activities: 10/29/2017  Hearing? N  Vision? N  Difficulty concentrating or making decisions? N  Walking or climbing  stairs? N  Dressing or bathing? N  Doing errands, shopping? N  Preparing Food and eating ? N  Using the Toilet? N  In the past six months, have you accidently leaked urine? N  Do you have problems with loss of bowel control? N  Managing your Medications? N  Managing your Finances? N  Housekeeping or managing your Housekeeping? N  Some recent data might be hidden    Patient Care Team: Myrlene Broker, MD as PCP - General (Internal Medicine)    Assessment:   This is a routine wellness examination for Kayla Key. Physical assessment deferred to PCP.   Exercise Activities and Dietary recommendations Current Exercise Habits: Home exercise routine, Type of exercise: walking(playing  with grandchildren daily.), Time (Minutes): 40, Frequency (Times/Week): 5, Weekly Exercise (Minutes/Week): 200, Intensity: Mild, Exercise limited by: orthopedic condition(s)  Diet (meal preparation, eat out, water intake, caffeinated beverages, dairy products, fruits and vegetables): in general, a "healthy" diet  , well balanced, eats a variety of fruits and vegetables daily, limits salt, fat/cholesterol, sugar,carbohydrates,caffeine, drinks 6-8 glasses of water daily.  Goals    . Patient Stated     Maintain current health status.       Fall Risk Fall Risk  10/29/2017 10/25/2016 10/19/2015 12/29/2012  Falls in the past year? No No Yes No  Number falls in past yr: - - 2 or more -  Injury with Fall? - - No -   Depression Screen PHQ 2/9 Scores 10/29/2017 10/25/2016 10/19/2015 12/29/2012  PHQ - 2 Score 0 0 0 0     Cognitive Function       Ad8 score reviewed for issues:  Issues making decisions: no  Less interest in hobbies / activities: no  Repeats questions, stories (family complaining): no  Trouble using ordinary gadgets (microwave, computer, phone):no  Forgets the month or year: no  Mismanaging finances: no  Remembering appts: no  Daily problems with thinking and/or memory: no Ad8 score  is= 0  Immunization History  Administered Date(s) Administered  . Influenza, High Dose Seasonal PF 10/19/2015, 10/25/2016, 10/29/2017  . Influenza,inj,Quad PF,6+ Mos 10/01/2012, 10/13/2013, 10/17/2014  . PPD Test 06/13/2010  . Pneumococcal Conjugate-13 10/13/2013  . Pneumococcal Polysaccharide-23 10/17/2014  . Td 06/17/2006    Screening Tests Health Maintenance  Topic Date Due  . TETANUS/TDAP  06/16/2016  . MAMMOGRAM  07/23/2019  . COLONOSCOPY  04/14/2024  . INFLUENZA VACCINE  Completed  . DEXA SCAN  Completed  . Hepatitis C Screening  Completed  . PNA vac Low Risk Adult  Completed      Plan:      Continue doing brain  stimulating activities (puzzles, reading, adult coloring books, staying active) to keep memory sharp.   Continue to eat heart healthy diet (full of fruits, vegetables, whole grains, lean protein, water--limit salt, fat, and sugar intake) and increase physical activity as tolerated. I have personally reviewed and noted the following in the patient's chart:   . Medical and social history . Use of alcohol, tobacco or illicit drugs  . Current medications and supplements . Functional ability and status . Nutritional status . Physical activity . Advanced directives . List of other physicians . Vitals . Screenings to include cognitive, depression, and falls . Referrals and appointments  In addition, I have reviewed and discussed with patient certain preventive protocols, quality metrics, and best practice recommendations. A written personalized care plan for preventive services as well as general preventive health recommendations were provided to patient.     Wanda Plump, RN  10/29/2017

## 2017-10-29 ENCOUNTER — Ambulatory Visit (INDEPENDENT_AMBULATORY_CARE_PROVIDER_SITE_OTHER): Payer: Medicare Other | Admitting: *Deleted

## 2017-10-29 VITALS — BP 114/75 | HR 58 | Resp 17 | Ht 62.0 in | Wt 145.0 lb

## 2017-10-29 DIAGNOSIS — Z23 Encounter for immunization: Secondary | ICD-10-CM | POA: Diagnosis not present

## 2017-10-29 DIAGNOSIS — Z0000 Encounter for general adult medical examination without abnormal findings: Secondary | ICD-10-CM | POA: Diagnosis not present

## 2017-10-29 NOTE — Patient Instructions (Addendum)
Continue doing brain stimulating activities (puzzles, reading, adult coloring books, staying active) to keep memory sharp.   Continue to eat heart healthy diet (full of fruits, vegetables, whole grains, lean protein, water--limit salt, fat, and sugar intake) and increase physical activity as tolerated.   Kayla Key , Thank you for taking time to come for your Medicare Wellness Visit. I appreciate your ongoing commitment to your health goals. Please review the following plan we discussed and let me know if I can assist you in the future.   These are the goals we discussed: Goals    . Patient Stated     Maintain current health status.       This is a list of the screening recommended for you and due dates:  Health Maintenance  Topic Date Due  . Tetanus Vaccine  06/16/2016  . Flu Shot  05/30/2018*  . Mammogram  07/23/2019  . Colon Cancer Screening  04/14/2024  . DEXA scan (bone density measurement)  Completed  .  Hepatitis C: One time screening is recommended by Center for Disease Control  (CDC) for  adults born from [birth year] through 1963.   Completed  . Pneumonia vaccines  Completed  *Topic was postponed. The date shown is not the original due date.   Health Maintenance, Female Adopting a healthy lifestyle and getting preventive care can go a long way to promote health and wellness. Talk with your health care provider about what schedule of regular examinations is right for you. This is a good chance for you to check in with your provider about disease prevention and staying healthy. In between checkups, there are plenty of things you can do on your own. Experts have done a lot of research about which lifestyle changes and preventive measures are most likely to keep you healthy. Ask your health care provider for more information. Weight and diet Eat a healthy diet  Be sure to include plenty of vegetables, fruits, low-fat dairy products, and lean protein.  Do not eat a lot of foods high  in solid fats, added sugars, or salt.  Get regular exercise. This is one of the most important things you can do for your health. ? Most adults should exercise for at least 150 minutes each week. The exercise should increase your heart rate and make you sweat (moderate-intensity exercise). ? Most adults should also do strengthening exercises at least twice a week. This is in addition to the moderate-intensity exercise.  Maintain a healthy weight  Body mass index (BMI) is a measurement that can be used to identify possible weight problems. It estimates body fat based on height and weight. Your health care provider can help determine your BMI and help you achieve or maintain a healthy weight.  For females 45 years of age and older: ? A BMI below 18.5 is considered underweight. ? A BMI of 18.5 to 24.9 is normal. ? A BMI of 25 to 29.9 is considered overweight. ? A BMI of 30 and above is considered obese.  Watch levels of cholesterol and blood lipids  You should start having your blood tested for lipids and cholesterol at 70 years of age, then have this test every 5 years.  You may need to have your cholesterol levels checked more often if: ? Your lipid or cholesterol levels are high. ? You are older than 70 years of age. ? You are at high risk for heart disease.  Cancer screening Lung Cancer  Lung cancer screening is recommended for  adults 30-60 years old who are at high risk for lung cancer because of a history of smoking.  A yearly low-dose CT scan of the lungs is recommended for people who: ? Currently smoke. ? Have quit within the past 15 years. ? Have at least a 30-pack-year history of smoking. A pack year is smoking an average of one pack of cigarettes a day for 1 year.  Yearly screening should continue until it has been 15 years since you quit.  Yearly screening should stop if you develop a health problem that would prevent you from having lung cancer treatment.  Breast  Cancer  Practice breast self-awareness. This means understanding how your breasts normally appear and feel.  It also means doing regular breast self-exams. Let your health care provider know about any changes, no matter how small.  If you are in your 20s or 30s, you should have a clinical breast exam (CBE) by a health care provider every 1-3 years as part of a regular health exam.  If you are 55 or older, have a CBE every year. Also consider having a breast X-ray (mammogram) every year.  If you have a family history of breast cancer, talk to your health care provider about genetic screening.  If you are at high risk for breast cancer, talk to your health care provider about having an MRI and a mammogram every year.  Breast cancer gene (BRCA) assessment is recommended for women who have family members with BRCA-related cancers. BRCA-related cancers include: ? Breast. ? Ovarian. ? Tubal. ? Peritoneal cancers.  Results of the assessment will determine the need for genetic counseling and BRCA1 and BRCA2 testing.  Cervical Cancer Your health care provider may recommend that you be screened regularly for cancer of the pelvic organs (ovaries, uterus, and vagina). This screening involves a pelvic examination, including checking for microscopic changes to the surface of your cervix (Pap test). You may be encouraged to have this screening done every 3 years, beginning at age 16.  For women ages 23-65, health care providers may recommend pelvic exams and Pap testing every 3 years, or they may recommend the Pap and pelvic exam, combined with testing for human papilloma virus (HPV), every 5 years. Some types of HPV increase your risk of cervical cancer. Testing for HPV may also be done on women of any age with unclear Pap test results.  Other health care providers may not recommend any screening for nonpregnant women who are considered low risk for pelvic cancer and who do not have symptoms. Ask your  health care provider if a screening pelvic exam is right for you.  If you have had past treatment for cervical cancer or a condition that could lead to cancer, you need Pap tests and screening for cancer for at least 20 years after your treatment. If Pap tests have been discontinued, your risk factors (such as having a new sexual partner) need to be reassessed to determine if screening should resume. Some women have medical problems that increase the chance of getting cervical cancer. In these cases, your health care provider may recommend more frequent screening and Pap tests.  Colorectal Cancer  This type of cancer can be detected and often prevented.  Routine colorectal cancer screening usually begins at 70 years of age and continues through 70 years of age.  Your health care provider may recommend screening at an earlier age if you have risk factors for colon cancer.  Your health care provider may also recommend  using home test kits to check for hidden blood in the stool.  A small camera at the end of a tube can be used to examine your colon directly (sigmoidoscopy or colonoscopy). This is done to check for the earliest forms of colorectal cancer.  Routine screening usually begins at age 57.  Direct examination of the colon should be repeated every 5-10 years through 70 years of age. However, you may need to be screened more often if early forms of precancerous polyps or small growths are found.  Skin Cancer  Check your skin from head to toe regularly.  Tell your health care provider about any new moles or changes in moles, especially if there is a change in a mole's shape or color.  Also tell your health care provider if you have a mole that is larger than the size of a pencil eraser.  Always use sunscreen. Apply sunscreen liberally and repeatedly throughout the day.  Protect yourself by wearing long sleeves, pants, a wide-brimmed hat, and sunglasses whenever you are  outside.  Heart disease, diabetes, and high blood pressure  High blood pressure causes heart disease and increases the risk of stroke. High blood pressure is more likely to develop in: ? People who have blood pressure in the high end of the normal range (130-139/85-89 mm Hg). ? People who are overweight or obese. ? People who are African American.  If you are 29-78 years of age, have your blood pressure checked every 3-5 years. If you are 69 years of age or older, have your blood pressure checked every year. You should have your blood pressure measured twice-once when you are at a hospital or clinic, and once when you are not at a hospital or clinic. Record the average of the two measurements. To check your blood pressure when you are not at a hospital or clinic, you can use: ? An automated blood pressure machine at a pharmacy. ? A home blood pressure monitor.  If you are between 1 years and 63 years old, ask your health care provider if you should take aspirin to prevent strokes.  Have regular diabetes screenings. This involves taking a blood sample to check your fasting blood sugar level. ? If you are at a normal weight and have a low risk for diabetes, have this test once every three years after 70 years of age. ? If you are overweight and have a high risk for diabetes, consider being tested at a younger age or more often. Preventing infection Hepatitis B  If you have a higher risk for hepatitis B, you should be screened for this virus. You are considered at high risk for hepatitis B if: ? You were born in a country where hepatitis B is common. Ask your health care provider which countries are considered high risk. ? Your parents were born in a high-risk country, and you have not been immunized against hepatitis B (hepatitis B vaccine). ? You have HIV or AIDS. ? You use needles to inject street drugs. ? You live with someone who has hepatitis B. ? You have had sex with someone who has  hepatitis B. ? You get hemodialysis treatment. ? You take certain medicines for conditions, including cancer, organ transplantation, and autoimmune conditions.  Hepatitis C  Blood testing is recommended for: ? Everyone born from [birth year] through 1963. ? Anyone with known risk factors for hepatitis C.  Sexually transmitted infections (STIs)  You should be screened for sexually transmitted infections (STIs) including gonorrhea  and chlamydia if: ? You are sexually active and are younger than 70 years of age. ? You are older than 70 years of age and your health care provider tells you that you are at risk for this type of infection. ? Your sexual activity has changed since you were last screened and you are at an increased risk for chlamydia or gonorrhea. Ask your health care provider if you are at risk.  If you do not have HIV, but are at risk, it may be recommended that you take a prescription medicine daily to prevent HIV infection. This is called pre-exposure prophylaxis (PrEP). You are considered at risk if: ? You are sexually active and do not regularly use condoms or know the HIV status of your partner(s). ? You take drugs by injection. ? You are sexually active with a partner who has HIV.  Talk with your health care provider about whether you are at high risk of being infected with HIV. If you choose to begin PrEP, you should first be tested for HIV. You should then be tested every 3 months for as long as you are taking PrEP. Pregnancy  If you are premenopausal and you may become pregnant, ask your health care provider about preconception counseling.  If you may become pregnant, take 400 to 800 micrograms (mcg) of folic acid every day.  If you want to prevent pregnancy, talk to your health care provider about birth control (contraception). Osteoporosis and menopause  Osteoporosis is a disease in which the bones lose minerals and strength with aging. This can result in serious bone  fractures. Your risk for osteoporosis can be identified using a bone density scan.  If you are 49 years of age or older, or if you are at risk for osteoporosis and fractures, ask your health care provider if you should be screened.  Ask your health care provider whether you should take a calcium or vitamin D supplement to lower your risk for osteoporosis.  Menopause may have certain physical symptoms and risks.  Hormone replacement therapy may reduce some of these symptoms and risks. Talk to your health care provider about whether hormone replacement therapy is right for you. Follow these instructions at home:  Schedule regular health, dental, and eye exams.  Stay current with your immunizations.  Do not use any tobacco products including cigarettes, chewing tobacco, or electronic cigarettes.  If you are pregnant, do not drink alcohol.  If you are breastfeeding, limit how much and how often you drink alcohol.  Limit alcohol intake to no more than 1 drink per day for nonpregnant women. One drink equals 12 ounces of beer, 5 ounces of Tawnee Clegg, or 1 ounces of hard liquor.  Do not use street drugs.  Do not share needles.  Ask your health care provider for help if you need support or information about quitting drugs.  Tell your health care provider if you often feel depressed.  Tell your health care provider if you have ever been abused or do not feel safe at home. This information is not intended to replace advice given to you by your health care provider. Make sure you discuss any questions you have with your health care provider. Document Released: 07/30/2010 Document Revised: 06/22/2015 Document Reviewed: 10/18/2014 Elsevier Interactive Patient Education  2018 [employer]. Influenza Virus Vaccine injection What is this medicine? INFLUENZA VIRUS VACCINE (in floo EN zuh VAHY ruhs vak SEEN) helps to reduce the risk of getting influenza also known as the flu. The  vaccine only helps  protect you against some strains of the flu. This medicine may be used for other purposes; ask your health care provider or pharmacist if you have questions. COMMON BRAND NAME(S): Afluria, Agriflu, Alfuria, FLUAD, Fluarix, Fluarix Quadrivalent, Flublok, Flublok Quadrivalent, FLUCELVAX, Flulaval, Fluvirin, Fluzone, Fluzone High-Dose, Fluzone Intradermal What should I tell my health care provider before I take this medicine? They need to know if you have any of these conditions: -bleeding disorder like hemophilia -fever or infection -Guillain-Barre syndrome or other neurological problems -immune system problems -infection with the human immunodeficiency virus (HIV) or AIDS -low blood platelet counts -multiple sclerosis -an unusual or allergic reaction to influenza virus vaccine, latex, other medicines, foods, dyes, or preservatives. Different brands of vaccines contain different allergens. Some may contain latex or eggs. Talk to your doctor about your allergies to make sure that you get the right vaccine. -pregnant or trying to get pregnant -breast-feeding How should I use this medicine? This vaccine is for injection into a muscle or under the skin. It is given by a health care professional. A copy of Vaccine Information Statements will be given before each vaccination. Read this sheet carefully each time. The sheet may change frequently. Talk to your healthcare provider to see which vaccines are right for you. Some vaccines should not be used in all age groups. Overdosage: If you think you have taken too much of this medicine contact a poison control center or emergency room at once. NOTE: This medicine is only for you. Do not share this medicine with others. What if I miss a dose? This does not apply. What may interact with this medicine? -chemotherapy or radiation therapy -medicines that lower your immune system like etanercept, anakinra, infliximab, and adalimumab -medicines that treat or  prevent blood clots like warfarin -phenytoin -steroid medicines like prednisone or cortisone -theophylline -vaccines This list may not describe all possible interactions. Give your health care provider a list of all the medicines, herbs, non-prescription drugs, or dietary supplements you use. Also tell them if you smoke, drink alcohol, or use illegal drugs. Some items may interact with your medicine. What should I watch for while using this medicine? Report any side effects that do not go away within 3 days to your doctor or health care professional. Call your health care provider if any unusual symptoms occur within 6 weeks of receiving this vaccine. You may still catch the flu, but the illness is not usually as bad. You cannot get the flu from the vaccine. The vaccine will not protect against colds or other illnesses that may cause fever. The vaccine is needed every year. What side effects may I notice from receiving this medicine? Side effects that you should report to your doctor or health care professional as soon as possible: -allergic reactions like skin rash, itching or hives, swelling of the face, lips, or tongue Side effects that usually do not require medical attention (report to your doctor or health care professional if they continue or are bothersome): -fever -headache -muscle aches and pains -pain, tenderness, redness, or swelling at the injection site -tiredness This list may not describe all possible side effects. Call your doctor for medical advice about side effects. You may report side effects to FDA at 1-800-FDA-1088. Where should I keep my medicine? The vaccine will be given by a health care professional in a clinic, pharmacy, doctor's office, or other health care setting. You will not be given vaccine doses to store at home. NOTE: This sheet  is a summary. It may not cover all possible information. If you have questions about this medicine, talk to your doctor, pharmacist,  or health care provider.  2018 Elsevier/Gold Standard (2014-08-05 10:07:28)

## 2017-10-30 ENCOUNTER — Ambulatory Visit (INDEPENDENT_AMBULATORY_CARE_PROVIDER_SITE_OTHER): Payer: Medicare Other | Admitting: Family Medicine

## 2017-10-30 ENCOUNTER — Encounter: Payer: Self-pay | Admitting: Family Medicine

## 2017-10-30 DIAGNOSIS — M170 Bilateral primary osteoarthritis of knee: Secondary | ICD-10-CM | POA: Diagnosis not present

## 2017-10-30 NOTE — Patient Instructions (Signed)
Good to see you  Kayla Key is your friend Stay active Get into a routine  Continue the vitamins See me again In 6-8 weeks

## 2017-10-30 NOTE — Progress Notes (Signed)
Medical screening examination/treatment/procedure(s) were performed by non-physician practitioner and as supervising physician I was immediately available for consultation/collaboration. I agree with above. Elizabeth A Crawford, MD 

## 2017-10-30 NOTE — Progress Notes (Signed)
[name] [address] [place], [place] [address] Phone: [phone] Subjective:   [name], am serving as a scribe for Dr. Antoine Primas.  I'm seeing this patient by the request  of:  Myrlene Broker, MD   CC: Knee pain  [id number]  Kayla Key is a 72 y.o. female coming in with complaint of bilateral knee pain. She is feeling some improvement but is still having pain throughout the day. Sit to stand causes her the most pain.  Patient states that she has not been doing the daily exercises.  Patient continues to have pain.  Patient would though state that with the vitamins approximately 30% better though.    Past Medical History:  Diagnosis Date  . Arthritis   . Asthma   . GERD (gastroesophageal reflux disease)   . Hypertension   . MHA (microangiopathic hemolytic anemia) (HCC)   . Osteoporosis    Past Surgical History:  Procedure Laterality Date  . BUNIONECTOMY Right   . KNEE ARTHROSCOPY Right 2009  . KNEE ARTHROSCOPY Left 2015  . TOE SURGERY Bilateral 2005  . TUBAL LIGATION  1988   Social History   Socioeconomic History  . Marital status: Widowed    Spouse name: Not on file  . Number of children: Not on file  . Years of education: Not on file  . Highest education level: Not on file  Occupational History  . Not on file  Social Needs  . Financial resource strain: Not hard at all  . Food insecurity:    Worry: Never true    Inability: Never true  . Transportation needs:    Medical: No    Non-medical: No  Tobacco Use  . Smoking status: Never Smoker  . Smokeless tobacco: Never Used  Substance and Sexual Activity  . Alcohol use: No    Alcohol/week: 0.0 standard drinks  . Drug use: No  . Sexual activity: Not Currently  Lifestyle  . Physical activity:    Days per week: 5 days    Minutes per session: 40 min  . Stress: Only a little  Relationships  . Social connections:    Talks on phone: More than three  times a week    Gets together: More than three times a week    Attends religious service: Not on file    Active member of club or organization: Not on file    Attends meetings of clubs or organizations: Not on file    Relationship status: Widowed  Other Topics Concern  . Not on file  Social History Narrative  . Not on file   No Known Allergies Family History  Problem Relation Age of Onset  . Stroke Father   . Hypertension Sister   . Breast cancer Sister   . Cancer Other        breast   . Colon cancer Neg Hx      Current Outpatient Medications (Cardiovascular):  .  hydrochlorothiazide (HYDRODIURIL) 25 MG tablet, Take 1 tablet (25 mg total) by mouth daily. Overdue for annual appt must see provider for future refills .  nadolol (CORGARD) 20 MG tablet, TAKE 1 TABLET BY MOUTH EVERY DAY NEED annual appointment for further refills     Current Outpatient Medications (Other):  Marland Kitchen  Multiple Vitamin (MULTI-VITAMIN DAILY PO), Take 1 tablet by mouth daily. Marland Kitchen  omeprazole (PRILOSEC) 20 MG capsule, TAKE 1 CAPSULE (20 MG TOTAL) BY MOUTH DAILY.    Past medical  history, social, surgical and family history all reviewed in electronic medical record.  No pertanent information unless stated regarding to the chief complaint.   Review of Systems:  No headache, visual changes, nausea, vomiting, diarrhea, constipation, dizziness, abdominal pain, skin rash, fevers, chills, night sweats, weight loss, swollen lymph nodes, body aches, joint swelling, chest pain, shortness of breath, mood changes.  Positive muscle aches  Objective  Blood pressure 108/72, pulse (!) 52, height 5\' 2"  (1.575 m), weight 145 lb (65.8 kg), SpO2 98 %.    General: No apparent distress alert and oriented x3 mood and affect normal, dressed appropriately.  HEENT: Pupils equal, extraocular movements intact  Respiratory: Patient's speak in full sentences and does not appear short of breath  Cardiovascular: No lower extremity edema,  non tender, no erythema  Skin: Warm dry intact with no signs of infection or rash on extremities or on axial skeleton.  Abdomen: Soft nontender  Neuro: Cranial nerves II through XII are intact, neurovascularly intact in all extremities with 2+ DTRs and 2+ pulses.  Lymph: No lymphadenopathy of posterior or anterior cervical chain or axillae bilaterally.  Gait normal with good balance and coordination.  MSK:  Non tender with full range of motion and good stability and symmetric strength and tone of shoulders, elbows, wrist, hip and ankles bilaterally.  Knee: Bilateral valgus deformity noted.  Abnormal thigh to calf ratio.  Tender to palpation over medial and PF joint line.  ROM full in flexion and extension and lower leg rotation. instability with valgus force.  painful patellar compression. Patellar glide with moderate crepitus. Patellar and quadriceps tendons unremarkable. Hamstring and quadriceps strength is normal.     Impression and Recommendations:     The above documentation has been reviewed and is accurate and complete [name], DO       Note: This dictation was prepared with Dragon dictation along with smaller phrase technology. Any transcriptional errors that result from this process are unintentional.

## 2017-10-30 NOTE — Assessment & Plan Note (Signed)
No significant change.  Patient is also been noncompliant.  Discussed with patient again.  Patient will continue with conservative therapy at this time.  Follow-up with me again in 4 to 8 weeks

## 2017-10-31 ENCOUNTER — Telehealth: Payer: Self-pay | Admitting: Internal Medicine

## 2017-10-31 DIAGNOSIS — Z809 Family history of malignant neoplasm, unspecified: Secondary | ICD-10-CM

## 2017-10-31 NOTE — Telephone Encounter (Signed)
Copied from CRM [id number]. Topic: General - Other >> Oct 31, 2017  9:49 AM Tamela Oddi wrote: Reason for CRM: Stacy from [facility]. Called to get the status of a form (Cancer genetic test), that was faxed on 10/21/17 to Dr. Okey Dupre.  They have not gotten a response as of today.  Kennyth Arnold indicated that she will re-fax the form this morning.  Please advise.  CB# [phone]

## 2017-10-31 NOTE — Telephone Encounter (Signed)
Talked to patient and she is willing to do the referral to genetics to get the cancer genetic testing done

## 2017-10-31 NOTE — Telephone Encounter (Signed)
Referral placed.

## 2017-11-19 ENCOUNTER — Other Ambulatory Visit: Payer: Self-pay | Admitting: Internal Medicine

## 2017-12-08 ENCOUNTER — Other Ambulatory Visit: Payer: Self-pay | Admitting: Internal Medicine

## 2017-12-10 NOTE — Progress Notes (Signed)
[name] Smith D.O. [facility] [address] [place], [place] [address] Phone: [phone] Subjective:    I Kayla NighKana Thompson am serving as a Neurosurgeonscribe for Dr. Antoine PrimasZachary Smith.   CC: Bilateral knee pain  BJY:[id number]:Subjective  Kayla Key is a 72 y.o. female coming in with complaint of bilateral knee pain. States the knees are not doing well. Wants bilateral injections.  Patient has severe osteoarthritic changes.  Patient has had more instability.  Noticing that he cannot walk greater than 200 feet without pain.  Even the pain has been bad enough that has woke her up at night.     Past Medical History:  Diagnosis Date  . Arthritis   . Asthma   . GERD (gastroesophageal reflux disease)   . Hypertension   . MHA (microangiopathic hemolytic anemia) (HCC)   . Osteoporosis    Past Surgical History:  Procedure Laterality Date  . BUNIONECTOMY Right   . KNEE ARTHROSCOPY Right 2009  . KNEE ARTHROSCOPY Left 2015  . TOE SURGERY Bilateral 2005  . TUBAL LIGATION  1988   Social History   Socioeconomic History  . Marital status: Widowed    Spouse name: Not on file  . Number of children: Not on file  . Years of education: Not on file  . Highest education level: Not on file  Occupational History  . Not on file  Social Needs  . Financial resource strain: Not hard at all  . Food insecurity:    Worry: Never true    Inability: Never true  . Transportation needs:    Medical: No    Non-medical: No  Tobacco Use  . Smoking status: Never Smoker  . Smokeless tobacco: Never Used  Substance and Sexual Activity  . Alcohol use: No    Alcohol/week: 0.0 standard drinks  . Drug use: No  . Sexual activity: Not Currently  Lifestyle  . Physical activity:    Days per week: 5 days    Minutes per session: 40 min  . Stress: Only a little  Relationships  . Social connections:    Talks on phone: More than three times a week    Gets together: More than three times a week    Attends religious  service: Not on file    Active member of club or organization: Not on file    Attends meetings of clubs or organizations: Not on file    Relationship status: Widowed  Other Topics Concern  . Not on file  Social History Narrative  . Not on file   No Known Allergies Family History  Problem Relation Age of Onset  . Stroke Father   . Hypertension Sister   . Breast cancer Sister   . Cancer Other        breast   . Colon cancer Neg Hx      Current Outpatient Medications (Cardiovascular):  .  hydrochlorothiazide (HYDRODIURIL) 25 MG tablet, Take 1 tablet (25 mg total) by mouth daily. Overdue for annual appt must see provider for future refills .  nadolol (CORGARD) 20 MG tablet, TAKE 1 TABLET BY MOUTH EVERY DAY NEED annual appointment for further refills     Current Outpatient Medications (Other):  Marland Kitchen.  Multiple Vitamin (MULTI-VITAMIN DAILY PO), Take 1 tablet by mouth daily. Marland Kitchen.  omeprazole (PRILOSEC) 20 MG capsule, TAKE 1 CAPSULE (20 MG TOTAL) BY MOUTH DAILY.    Past medical history, social, surgical and family history all reviewed in electronic medical record.  No pertanent  information unless stated regarding to the chief complaint.   Review of Systems:  No headache, visual changes, nausea, vomiting, diarrhea, constipation, dizziness, abdominal pain, skin rash, fevers, chills, night sweats, weight loss, swollen lymph nodes, body aches, joint swelling, muscle aches, chest pain, shortness of breath, mood changes.   Objective  Blood pressure 120/90, pulse 60, height 5\' 2"  (1.575 m), weight 145 lb (65.8 kg), SpO2 94 %.  General: No apparent distress alert and oriented x3 mood and affect normal, dressed appropriately.  HEENT: Pupils equal, extraocular movements intact  Respiratory: Patient's speak in full sentences and does not appear short of breath  Cardiovascular: No lower extremity edema, non tender, no erythema  Skin: Warm dry intact with no signs of infection or rash on extremities  or on axial skeleton.  Abdomen: Soft nontender  Neuro: Cranial nerves II through XII are intact, neurovascularly intact in all extremities with 2+ DTRs and 2+ pulses.  Lymph: No lymphadenopathy of posterior or anterior cervical chain or axillae bilaterally.  Gait antalgic MSK:  Non tender with full range of motion and good stability and symmetric strength and tone of shoulders, elbows, wrist, hip, and ankles bilaterally.   After informed written and verbal consent, patient was seated on exam table. Right knee was prepped with alcohol swab and utilizing anterolateral approach, patient's right knee space was injected with 4:1  marcaine 0.5%: Kenalog 40mg /dL. Patient tolerated the procedure well without immediate complications.  After informed written and verbal consent, patient was seated on exam table. Left knee was prepped with alcohol swab and utilizing anterolateral approach, patient's left knee space was injected with 4:1  marcaine 0.5%: Kenalog 40mg /dL. Patient tolerated the procedure well without immediate complications.    Impression and Recommendations:     This case required medical decision making of moderate complexity. The above documentation has been reviewed and is accurate and complete [name], DO       Note: This dictation was prepared with Dragon dictation along with smaller phrase technology. Any transcriptional errors that result from this process are unintentional.

## 2017-12-11 ENCOUNTER — Encounter: Payer: Self-pay | Admitting: Family Medicine

## 2017-12-11 ENCOUNTER — Ambulatory Visit (INDEPENDENT_AMBULATORY_CARE_PROVIDER_SITE_OTHER): Payer: Medicare Other | Admitting: Family Medicine

## 2017-12-11 DIAGNOSIS — M170 Bilateral primary osteoarthritis of knee: Secondary | ICD-10-CM

## 2017-12-11 NOTE — Assessment & Plan Note (Signed)
Bilateral injections given today.  Worsening symptoms discussed icing regimen and home exercises.  Discussed which activities to do which wants to avoid.  Increase activity slowly over the course the next several days.  Patient could be candidate for Visco supplementation.  Follow-up again in 4 to 6 weeks

## 2017-12-11 NOTE — Patient Instructions (Addendum)
Good morning.  Ice is your friend Injected the knees today  I think the weather is playing a role Can repeat these injections every 3 months safely but we have other injections that may help as well and read about it.  Try everything else we were doing.  See me again in 4-5 weeks to see how you are doing

## 2017-12-28 ENCOUNTER — Other Ambulatory Visit: Payer: Self-pay | Admitting: Internal Medicine

## 2018-01-03 ENCOUNTER — Other Ambulatory Visit: Payer: Self-pay | Admitting: Internal Medicine

## 2018-01-12 NOTE — Progress Notes (Signed)
Kayla Key D.O. [facility] [address] [place], [place] [address] Phone: [phone] Subjective:   Kayla Key, Kayla Key, am serving as a scribe for Dr. Antoine PrimasZachary Key.   CC: Bilateral knee pain  BJY:NWGNFAOZHYHPI:Subjective  Kayla Key is a 72 y.o. female coming in with complaint of bilateral knee pain. She did have relief from the injections given last visit. She continues to have pain with sit to stand. Has not been using any modalities or NSAIDs for pain.  No knee arthritis.  Moderate to severe arthritic changes.  Has been doing very well now 1 month after the injections.  Since and feeling approximately 60 to 80% better on a daily basis.  Still has some mild instability but nothing severe.  Is able to do daily activities much more regularly.      Past Medical History:  Diagnosis Date  . Arthritis   . Asthma   . GERD (gastroesophageal reflux disease)   . Hypertension   . MHA (microangiopathic hemolytic anemia) (HCC)   . Osteoporosis    Past Surgical History:  Procedure Laterality Date  . BUNIONECTOMY Right   . KNEE ARTHROSCOPY Right 2009  . KNEE ARTHROSCOPY Left 2015  . TOE SURGERY Bilateral 2005  . TUBAL LIGATION  1988   Social History   Socioeconomic History  . Marital status: Widowed    Spouse name: Not on file  . Number of children: Not on file  . Years of education: Not on file  . Highest education level: Not on file  Occupational History  . Not on file  Social Needs  . Financial resource strain: Not hard at all  . Food insecurity:    Worry: Never true    Inability: Never true  . Transportation needs:    Medical: No    Non-medical: No  Tobacco Use  . Smoking status: Never Smoker  . Smokeless tobacco: Never Used  Substance and Sexual Activity  . Alcohol use: No    Alcohol/week: 0.0 standard drinks  . Drug use: No  . Sexual activity: Not Currently  Lifestyle  . Physical activity:    Days per week: 5 days    Minutes per session: 40 min  . Stress:  Only a little  Relationships  . Social connections:    Talks on phone: More than three times a week    Gets together: More than three times a week    Attends religious service: Not on file    Active member of club or organization: Not on file    Attends meetings of clubs or organizations: Not on file    Relationship status: Widowed  Other Topics Concern  . Not on file  Social History Narrative  . Not on file   No Known Allergies Family History  Problem Relation Age of Onset  . Stroke Father   . Hypertension Sister   . Breast cancer Sister   . Cancer Other        breast   . Colon cancer Neg Hx      Current Outpatient Medications (Cardiovascular):  .  hydrochlorothiazide (HYDRODIURIL) 25 MG tablet, Take 1 tablet (25 mg total) by mouth daily. Overdue for annual appt must see provider for future refills .  nadolol (CORGARD) 20 MG tablet, TAKE 1 TABLET BY MOUTH EVERY DAY NEED annual appointment for further refills     Current Outpatient Medications (Other):  Marland Kitchen.  Multiple Vitamin (MULTI-VITAMIN DAILY PO), Take 1 tablet by mouth daily. .Marland Kitchen  omeprazole (PRILOSEC) 20 MG capsule, TAKE 1 CAPSULE (20 MG TOTAL) BY MOUTH DAILY.    Past medical history, social, surgical and family history all reviewed in electronic medical record.  No pertanent information unless stated regarding to the chief complaint.   Review of Systems:  No headache, visual changes, nausea, vomiting, diarrhea, constipation, dizziness, abdominal pain, skin rash, fevers, chills, night sweats, weight loss, swollen lymph nodes, body aches, joint swelling, , chest pain, shortness of breath, mood changes.  Positive muscle aches  Objective  Blood pressure 118/86, pulse 64, height 5\' 2"  (1.575 m), weight 143 lb (64.9 kg), SpO2 98 %.    General: No apparent distress alert and oriented x3 mood and affect normal, dressed appropriately.  HEENT: Pupils equal, extraocular movements intact  Respiratory: Patient's speak in full  sentences and does not appear short of breath  Cardiovascular: No lower extremity edema, non tender, no erythema  Skin: Warm dry intact with no signs of infection or rash on extremities or on axial skeleton.  Abdomen: Soft nontender  Neuro: Cranial nerves II through XII are intact, neurovascularly intact in all extremities with 2+ DTRs and 2+ pulses.  Lymph: No lymphadenopathy of posterior or anterior cervical chain or axillae bilaterally.  Gait antalgic MSK:  Non tender with full range of motion and good stability and symmetric strength and tone of shoulders, elbows, wrist, hip, and ankles bilaterally.  Knee: Bilateral valgus deformity noted.  Abnormal thigh to calf ratio.  Tender to palpation over medial and PF joint line.  Less tender than previous exam ROM full in flexion and extension and lower leg rotation. instability with valgus force.  painful patellar compression. Patellar glide with mild crepitus. Patellar and quadriceps tendons unremarkable. Hamstring and quadriceps strength is normal.    Impression and Recommendations:     This case required medical decision making of moderate complexity. The above documentation has been reviewed and is accurate and complete Kayla Saa, DO       Note: This dictation was prepared with Dragon dictation along with smaller phrase technology. Any transcriptional errors that result from this process are unintentional.

## 2018-01-13 ENCOUNTER — Encounter: Payer: Self-pay | Admitting: Family Medicine

## 2018-01-13 ENCOUNTER — Ambulatory Visit (INDEPENDENT_AMBULATORY_CARE_PROVIDER_SITE_OTHER): Payer: Medicare Other | Admitting: Family Medicine

## 2018-01-13 DIAGNOSIS — M170 Bilateral primary osteoarthritis of knee: Secondary | ICD-10-CM

## 2018-01-13 NOTE — Patient Instructions (Signed)
Good to see you  Ice is your friend pennsaid pinkie amount topically 2 times daily as needed.   See me again in 2 months  Happy holidays!

## 2018-01-13 NOTE — Assessment & Plan Note (Signed)
Arthritis.  Doing well.  No change in management.  Follow-up again in 6 to 12 weeks.

## 2018-03-15 ENCOUNTER — Other Ambulatory Visit: Payer: Self-pay | Admitting: Internal Medicine

## 2018-03-16 NOTE — Progress Notes (Signed)
[name] [address] [place], [place] [address] Phone: [phone] Subjective:   [name], am serving as a scribe for Dr. Antoine Primas.   CC: Bilateral knee pain  GYB:[id number]     Update 03/17/2018: Kayla Key is a 73 y.o. female coming in with complaint of bilateral knee pain with left greater than right. Does have constant pain. Has not been on her feet more or changed any shoes. Has had relief from steroid injections in the past. Last injection 12/01/2017.  Patient having worsening pain.  Patient recently has had increasing stress as well.  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  Patient feels that that has made things a little more stressful recently.  Over there most days of the week and not working out as regularly as she was doing previously.      Past Medical History:  Diagnosis Date  . Arthritis   . Asthma   . GERD (gastroesophageal reflux disease)   . Hypertension   . MHA (microangiopathic hemolytic anemia) (HCC)   . Osteoporosis    Past Surgical History:  Procedure Laterality Date  . BUNIONECTOMY Right   . KNEE ARTHROSCOPY Right 2009  . KNEE ARTHROSCOPY Left 2015  . TOE SURGERY Bilateral 2005  . TUBAL LIGATION  1988   Social History   Socioeconomic History  . Marital status: Widowed    Spouse name: Not on file  . Number of children: Not on file  . Years of education: Not on file  . Highest education level: Not on file  Occupational History  . Not on file  Social Needs  . Financial resource strain: Not hard at all  . Food insecurity:    Worry: Never true    Inability: Never true  . Transportation needs:    Medical: No    Non-medical: No  Tobacco Use  . Smoking status: Never Smoker  . Smokeless tobacco: Never Used  Substance and Sexual Activity  . Alcohol use: No    Alcohol/week: 0.0 standard drinks  . Drug use: No  . Sexual activity: Not  Currently  Lifestyle  . Physical activity:    Days per week: 5 days    Minutes per session: 40 min  . Stress: Only a little  Relationships  . Social connections:    Talks on phone: More than three times a week    Gets together: More than three times a week    Attends religious service: Not on file    Active member of club or organization: Not on file    Attends meetings of clubs or organizations: Not on file    Relationship status: Widowed  Other Topics Concern  . Not on file  Social History Narrative  . Not on file   No Known Allergies Family History  Problem Relation Age of Onset  . Stroke Father   . Hypertension Sister   . Breast cancer Sister   . Cancer Other        breast   . Colon cancer Neg Hx      Current Outpatient Medications (Cardiovascular):  .  hydrochlorothiazide (HYDRODIURIL) 25 MG tablet, Take 1 tablet (25 mg total) by mouth daily. Overdue for annual appt must see provider for future refills .  nadolol (CORGARD) 20 MG tablet, TAKE 1 TABLET BY MOUTH EVERY DAY NEED annual appointment for further refills     Current Outpatient  Medications (Other):  Marland Kitchen  Multiple Vitamin (MULTI-VITAMIN DAILY PO), Take 1 tablet by mouth daily. Marland Kitchen  omeprazole (PRILOSEC) 20 MG capsule, TAKE 1 CAPSULE (20 MG TOTAL) BY MOUTH DAILY.    Past medical history, social, surgical and family history all reviewed in electronic medical record.  No pertanent information unless stated regarding to the chief complaint.   Review of Systems:  No headache, visual changes, nausea, vomiting, diarrhea, constipation, dizziness, abdominal pain, skin rash, fevers, chills, night sweats, weight loss, swollen lymph nodes, body aches, joint swelling, muscle aches, chest pain, shortness of breath, mood changes.   Objective  Blood pressure 110/82, pulse 70, height 5\' 2"  (1.575 m), weight 143 lb (64.9 kg), SpO2 98 %.   General: No apparent distress alert and oriented x3 mood and affect normal, dressed  appropriately.  HEENT: Pupils equal, extraocular movements intact  Respiratory: Patient's speak in full sentences and does not appear short of breath  Cardiovascular: No lower extremity edema, non tender, no erythema  Skin: Warm dry intact with no signs of infection or rash on extremities or on axial skeleton.  Abdomen: Soft nontender  Neuro: Cranial nerves II through XII are intact, neurovascularly intact in all extremities with 2+ DTRs and 2+ pulses.  Lymph: No lymphadenopathy of posterior or anterior cervical chain or axillae bilaterally.  Gait antalgic MSK:  Non tender with full range of motion and good stability and symmetric strength and tone of shoulders, elbows, wrist, hip and ankles bilaterally.  Knee: Bilateral valgus deformity noted.  Normal thigh to calf ratio.  Tender to palpation over medial and PF joint line.  ROM full in flexion and extension and lower leg rotation. instability with valgus force.  painful patellar compression. Patellar glide with moderate crepitus. Patellar and quadriceps tendons unremarkable. Hamstring and quadriceps strength is normal.  After informed written and verbal consent, patient was seated on exam table. Right knee was prepped with alcohol swab and utilizing anterolateral approach, patient's right knee space was injected with 4:1  marcaine 0.5%: Kenalog 40mg /dL. Patient tolerated the procedure well without immediate complications.  After informed written and verbal consent, patient was seated on exam table. Left knee was prepped with alcohol swab and utilizing anterolateral approach, patient's left knee space was injected with 4:1  marcaine 0.5%: Kenalog 40mg /dL. Patient tolerated the procedure well without immediate complications.      Impression and Recommendations:     This case required medical decision making of moderate complexity. The above documentation has been reviewed and is accurate and complete [name], DO       Note:  This dictation was prepared with Dragon dictation along with smaller phrase technology. Any transcriptional errors that result from this process are unintentional.

## 2018-03-17 ENCOUNTER — Ambulatory Visit (INDEPENDENT_AMBULATORY_CARE_PROVIDER_SITE_OTHER): Payer: Medicare Other | Admitting: Family Medicine

## 2018-03-17 ENCOUNTER — Encounter: Payer: Self-pay | Admitting: Family Medicine

## 2018-03-17 DIAGNOSIS — M170 Bilateral primary osteoarthritis of knee: Secondary | ICD-10-CM | POA: Diagnosis not present

## 2018-03-17 NOTE — Assessment & Plan Note (Signed)
Bilateral injections given today.  Discussed the possibility of Visco supplementation.  Patient will consider this at follow-up.  Discussed icing regimen and home exercise, discussed topical anti-inflammatories.  Patient declined bracing or formal physical therapy with everything else that is occurring in her life at the moment.  Follow-up with me again in 6 weeks and will consider the Visco supplementation

## 2018-03-17 NOTE — Patient Instructions (Signed)
Good to see you  Ice is your friend Injected knees again today  Will give approval monovisc just in case See me again In 6 weeks

## 2018-03-23 ENCOUNTER — Other Ambulatory Visit: Payer: Self-pay | Admitting: Internal Medicine

## 2018-03-25 ENCOUNTER — Other Ambulatory Visit (INDEPENDENT_AMBULATORY_CARE_PROVIDER_SITE_OTHER): Payer: Medicare Other

## 2018-03-25 ENCOUNTER — Ambulatory Visit (INDEPENDENT_AMBULATORY_CARE_PROVIDER_SITE_OTHER): Payer: Medicare Other | Admitting: Internal Medicine

## 2018-03-25 ENCOUNTER — Encounter: Payer: Self-pay | Admitting: Internal Medicine

## 2018-03-25 VITALS — BP 120/80 | HR 80 | Temp 97.8°F | Ht 62.0 in | Wt 144.0 lb

## 2018-03-25 DIAGNOSIS — G8929 Other chronic pain: Secondary | ICD-10-CM | POA: Diagnosis not present

## 2018-03-25 DIAGNOSIS — R7301 Impaired fasting glucose: Secondary | ICD-10-CM

## 2018-03-25 DIAGNOSIS — M25562 Pain in left knee: Secondary | ICD-10-CM

## 2018-03-25 DIAGNOSIS — I10 Essential (primary) hypertension: Secondary | ICD-10-CM

## 2018-03-25 DIAGNOSIS — K2100 Gastro-esophageal reflux disease with esophagitis, without bleeding: Secondary | ICD-10-CM

## 2018-03-25 DIAGNOSIS — M25561 Pain in right knee: Secondary | ICD-10-CM

## 2018-03-25 LAB — LIPID PANEL
CHOL/HDL RATIO: 2
Cholesterol: 190 mg/dL (ref 0–200)
HDL: 77.5 mg/dL (ref >39.00)
LDL Cholesterol: 103 mg/dL — ABNORMAL HIGH (ref 0–99)
NonHDL: 112.25
TRIGLYCERIDES: 46 mg/dL (ref 0.0–149.0)
VLDL: 9.2 mg/dL (ref 0.0–40.0)

## 2018-03-25 LAB — MAGNESIUM: MAGNESIUM: 2 mg/dL (ref 1.5–2.5)

## 2018-03-25 LAB — COMPREHENSIVE METABOLIC PANEL
ALK PHOS: 77 U/L (ref 39–117)
ALT: 23 U/L (ref 0–35)
AST: 22 U/L (ref 0–37)
Albumin: 3.8 g/dL (ref 3.5–5.2)
BUN: 18 mg/dL (ref 6–23)
CHLORIDE: 105 meq/L (ref 96–112)
CO2: 29 meq/L (ref 19–32)
Calcium: 8.7 mg/dL (ref 8.4–10.5)
Creatinine, Ser: 0.83 mg/dL (ref 0.40–1.20)
GFR: 82.16 mL/min (ref >60.00)
GLUCOSE: 91 mg/dL (ref 70–99)
POTASSIUM: 3.4 meq/L — AB (ref 3.5–5.1)
SODIUM: 142 meq/L (ref 135–145)
TOTAL PROTEIN: 6.7 g/dL (ref 6.0–8.3)
Total Bilirubin: 0.7 mg/dL (ref 0.2–1.2)

## 2018-03-25 LAB — HEMOGLOBIN A1C: Hgb A1c MFr Bld: 6.3 % (ref 4.6–6.5)

## 2018-03-25 LAB — CBC
HCT: 41 % (ref 36.0–46.0)
Hemoglobin: 13.3 g/dL (ref 12.0–15.0)
MCHC: 32.4 g/dL (ref 30.0–36.0)
MCV: 83.4 fl (ref 78.0–100.0)
Platelets: 345 10*3/uL (ref 150.0–400.0)
RBC: 4.92 Mil/uL (ref 3.87–5.11)
RDW: 15 % (ref 11.5–15.5)
WBC: 7.6 10*3/uL (ref 4.0–10.5)

## 2018-03-25 MED ORDER — HYDROCHLOROTHIAZIDE 25 MG PO TABS: 25.0000 mg | ORAL_TABLET | Freq: Every day | ORAL | 3 refills | Status: DC

## 2018-03-25 MED ORDER — NADOLOL 20 MG PO TABS: ORAL_TABLET | ORAL | 3 refills | Status: DC

## 2018-03-25 NOTE — Progress Notes (Signed)
   Subjective:   Patient ID: Kayla Key, female    DOB: 1945-10-12, 73 y.o.   MRN: [id number]  HPI The patient is a 73 YO female coming in for follow up of her blood pressure (taking nadolol and hctz, denies chest pains or headaches or SOB, denies side effects but is having some cramps recently), and GERD (taking omeprazole 20 mg prn, not in a couple of years), and arthritis (seeing sports medicine for injections of knees intermittent, trying to exercise, denies recent injury or falls, just got injection last week but not helping well).   Review of Systems  Constitutional: Negative.   HENT: Negative.   Eyes: Negative.   Respiratory: Negative for cough, chest tightness and shortness of breath.   Cardiovascular: Negative for chest pain, palpitations and leg swelling.  Gastrointestinal: Negative for abdominal distention, abdominal pain, constipation, diarrhea, nausea and vomiting.  Musculoskeletal: Positive for arthralgias.  Skin: Negative.   Neurological: Negative.   Psychiatric/Behavioral: Negative.     Objective:  Physical Exam Constitutional:      Appearance: She is well-developed.  HENT:     Head: Normocephalic and atraumatic.  Neck:     Musculoskeletal: Normal range of motion.  Cardiovascular:     Rate and Rhythm: Normal rate and regular rhythm.  Pulmonary:     Effort: Pulmonary effort is normal. No respiratory distress.     Breath sounds: Normal breath sounds. No wheezing or rales.  Abdominal:     General: Bowel sounds are normal. There is no distension.     Palpations: Abdomen is soft.     Tenderness: There is no abdominal tenderness. There is no rebound.  Skin:    General: Skin is warm and dry.  Neurological:     Mental Status: She is alert and oriented to person, place, and time.     Coordination: Coordination normal.     Vitals:   03/25/18 0820  BP: 120/80  Pulse: 80  Temp: 97.8 F (36.6 C)  TempSrc: Oral  SpO2: 98%  Weight: 144 lb (65.3 kg)    Height: 5\' 2"  (1.575 m)    Assessment & Plan:

## 2018-03-25 NOTE — Assessment & Plan Note (Signed)
Getting injections in the knees and has had prior surgeries as well.

## 2018-03-25 NOTE — Assessment & Plan Note (Signed)
Taking omeprazole if needed only and not used in a year or two.

## 2018-03-25 NOTE — Patient Instructions (Signed)
Try taking magnesium daily to help with the cramps.  Health Maintenance, Female Adopting a healthy lifestyle and getting preventive care can go a long way to promote health and wellness. Talk with your health care provider about what schedule of regular examinations is right for you. This is a good chance for you to check in with your provider about disease prevention and staying healthy. In between checkups, there are plenty of things you can do on your own. Experts have done a lot of research about which lifestyle changes and preventive measures are most likely to keep you healthy. Ask your health care provider for more information. Weight and diet Eat a healthy diet  Be sure to include plenty of vegetables, fruits, low-fat dairy products, and lean protein.  Do not eat a lot of foods high in solid fats, added sugars, or salt.  Get regular exercise. This is one of the most important things you can do for your health. ? Most adults should exercise for at least 150 minutes each week. The exercise should increase your heart rate and make you sweat (moderate-intensity exercise). ? Most adults should also do strengthening exercises at least twice a week. This is in addition to the moderate-intensity exercise. Maintain a healthy weight  Body mass index (BMI) is a measurement that can be used to identify possible weight problems. It estimates body fat based on height and weight. Your health care provider can help determine your BMI and help you achieve or maintain a healthy weight.  For females 71 years of age and older: ? A BMI below 18.5 is considered underweight. ? A BMI of 18.5 to 24.9 is normal. ? A BMI of 25 to 29.9 is considered overweight. ? A BMI of 30 and above is considered obese. Watch levels of cholesterol and blood lipids  You should start having your blood tested for lipids and cholesterol at 71 years of age, then have this test every 5 years.  You may need to have your  cholesterol levels checked more often if: ? Your lipid or cholesterol levels are high. ? You are older than 71 years of age. ? You are at high risk for heart disease. Cancer screening Lung Cancer  Lung cancer screening is recommended for adults 84-64 years old who are at high risk for lung cancer because of a history of smoking.  A yearly low-dose CT scan of the lungs is recommended for people who: ? Currently smoke. ? Have quit within the past 15 years. ? Have at least a 30-pack-year history of smoking. A pack year is smoking an average of one pack of cigarettes a day for 1 year.  Yearly screening should continue until it has been 15 years since you quit.  Yearly screening should stop if you develop a health problem that would prevent you from having lung cancer treatment. Breast Cancer  Practice breast self-awareness. This means understanding how your breasts normally appear and feel.  It also means doing regular breast self-exams. Let your health care provider know about any changes, no matter how small.  If you are in your 20s or 30s, you should have a clinical breast exam (CBE) by a health care provider every 1-3 years as part of a regular health exam.  If you are 57 or older, have a CBE every year. Also consider having a breast X-ray (mammogram) every year.  If you have a family history of breast cancer, talk to your health care provider about genetic screening.  If you are at high risk for breast cancer, talk to your health care provider about having an MRI and a mammogram every year.  Breast cancer gene (BRCA) assessment is recommended for women who have family members with BRCA-related cancers. BRCA-related cancers include: ? Breast. ? Ovarian. ? Tubal. ? Peritoneal cancers.  Results of the assessment will determine the need for genetic counseling and BRCA1 and BRCA2 testing. Cervical Cancer Your health care provider may recommend that you be screened regularly for  cancer of the pelvic organs (ovaries, uterus, and vagina). This screening involves a pelvic examination, including checking for microscopic changes to the surface of your cervix (Pap test). You may be encouraged to have this screening done every 3 years, beginning at age 42.  For women ages 58-65, health care providers may recommend pelvic exams and Pap testing every 3 years, or they may recommend the Pap and pelvic exam, combined with testing for human papilloma virus (HPV), every 5 years. Some types of HPV increase your risk of cervical cancer. Testing for HPV may also be done on women of any age with unclear Pap test results.  Other health care providers may not recommend any screening for nonpregnant women who are considered low risk for pelvic cancer and who do not have symptoms. Ask your health care provider if a screening pelvic exam is right for you.  If you have had past treatment for cervical cancer or a condition that could lead to cancer, you need Pap tests and screening for cancer for at least 20 years after your treatment. If Pap tests have been discontinued, your risk factors (such as having a new sexual partner) need to be reassessed to determine if screening should resume. Some women have medical problems that increase the chance of getting cervical cancer. In these cases, your health care provider may recommend more frequent screening and Pap tests. Colorectal Cancer  This type of cancer can be detected and often prevented.  Routine colorectal cancer screening usually begins at 71 years of age and continues through 71 years of age.  Your health care provider may recommend screening at an earlier age if you have risk factors for colon cancer.  Your health care provider may also recommend using home test kits to check for hidden blood in the stool.  A small camera at the end of a tube can be used to examine your colon directly (sigmoidoscopy or colonoscopy). This is done to check for  the earliest forms of colorectal cancer.  Routine screening usually begins at age 11.  Direct examination of the colon should be repeated every 5-10 years through 71 years of age. However, you may need to be screened more often if early forms of precancerous polyps or small growths are found. Skin Cancer  Check your skin from head to toe regularly.  Tell your health care provider about any new moles or changes in moles, especially if there is a change in a mole's shape or color.  Also tell your health care provider if you have a mole that is larger than the size of a pencil eraser.  Always use sunscreen. Apply sunscreen liberally and repeatedly throughout the day.  Protect yourself by wearing long sleeves, pants, a wide-brimmed hat, and sunglasses whenever you are outside. Heart disease, diabetes, and high blood pressure  High blood pressure causes heart disease and increases the risk of stroke. High blood pressure is more likely to develop in: ? People who have blood pressure in the high  end of the normal range (130-139/85-89 mm Hg). ? People who are overweight or obese. ? People who are African American.  If you are 36-85 years of age, have your blood pressure checked every 3-5 years. If you are 30 years of age or older, have your blood pressure checked every year. You should have your blood pressure measured twice-once when you are at a hospital or clinic, and once when you are not at a hospital or clinic. Record the average of the two measurements. To check your blood pressure when you are not at a hospital or clinic, you can use: ? An automated blood pressure machine at a pharmacy. ? A home blood pressure monitor.  If you are between 67 years and 52 years old, ask your health care provider if you should take aspirin to prevent strokes.  Have regular diabetes screenings. This involves taking a blood sample to check your fasting blood sugar level. ? If you are at a normal weight and  have a low risk for diabetes, have this test once every three years after 71 years of age. ? If you are overweight and have a high risk for diabetes, consider being tested at a younger age or more often. Preventing infection Hepatitis B  If you have a higher risk for hepatitis B, you should be screened for this virus. You are considered at high risk for hepatitis B if: ? You were born in a country where hepatitis B is common. Ask your health care provider which countries are considered high risk. ? Your parents were born in a high-risk country, and you have not been immunized against hepatitis B (hepatitis B vaccine). ? You have HIV or AIDS. ? You use needles to inject street drugs. ? You live with someone who has hepatitis B. ? You have had sex with someone who has hepatitis B. ? You get hemodialysis treatment. ? You take certain medicines for conditions, including cancer, organ transplantation, and autoimmune conditions. Hepatitis C  Blood testing is recommended for: ? Everyone born from [birth year] through 1963. ? Anyone with known risk factors for hepatitis C. Sexually transmitted infections (STIs)  You should be screened for sexually transmitted infections (STIs) including gonorrhea and chlamydia if: ? You are sexually active and are younger than 71 years of age. ? You are older than 71 years of age and your health care provider tells you that you are at risk for this type of infection. ? Your sexual activity has changed since you were last screened and you are at an increased risk for chlamydia or gonorrhea. Ask your health care provider if you are at risk.  If you do not have HIV, but are at risk, it may be recommended that you take a prescription medicine daily to prevent HIV infection. This is called pre-exposure prophylaxis (PrEP). You are considered at risk if: ? You are sexually active and do not regularly use condoms or know the HIV status of your partner(s). ? You take drugs by  injection. ? You are sexually active with a partner who has HIV. Talk with your health care provider about whether you are at high risk of being infected with HIV. If you choose to begin PrEP, you should first be tested for HIV. You should then be tested every 3 months for as long as you are taking PrEP. Pregnancy  If you are premenopausal and you may become pregnant, ask your health care provider about preconception counseling.  If you may become pregnant,  take 400 to 800 micrograms (mcg) of folic acid every day.  If you want to prevent pregnancy, talk to your health care provider about birth control (contraception). Osteoporosis and menopause  Osteoporosis is a disease in which the bones lose minerals and strength with aging. This can result in serious bone fractures. Your risk for osteoporosis can be identified using a bone density scan.  If you are 30 years of age or older, or if you are at risk for osteoporosis and fractures, ask your health care provider if you should be screened.  Ask your health care provider whether you should take a calcium or vitamin D supplement to lower your risk for osteoporosis.  Menopause may have certain physical symptoms and risks.  Hormone replacement therapy may reduce some of these symptoms and risks. Talk to your health care provider about whether hormone replacement therapy is right for you. Follow these instructions at home:  Schedule regular health, dental, and eye exams.  Stay current with your immunizations.  Do not use any tobacco products including cigarettes, chewing tobacco, or electronic cigarettes.  If you are pregnant, do not drink alcohol.  If you are breastfeeding, limit how much and how often you drink alcohol.  Limit alcohol intake to no more than 1 drink per day for nonpregnant women. One drink equals 12 ounces of beer, 5 ounces of wine, or 1 ounces of hard liquor.  Do not use street drugs.  Do not share needles.  Ask  your health care provider for help if you need support or information about quitting drugs.  Tell your health care provider if you often feel depressed.  Tell your health care provider if you have ever been abused or do not feel safe at home. This information is not intended to replace advice given to you by your health care provider. Make sure you discuss any questions you have with your health care provider. Document Released: 07/30/2010 Document Revised: 06/22/2015 Document Reviewed: 10/18/2014 Elsevier Interactive Patient Education  2019 [employer].

## 2018-03-25 NOTE — Assessment & Plan Note (Signed)
Taking hctz and nadolol with good BP control. Checking CMP and adjust as needed.

## 2018-04-28 NOTE — Progress Notes (Signed)
[facility] [address] [place], [place] [address] Phone: [phone] Subjective:   [facility], am serving as a scribe for Dr. Antoine Primas.  I'm seeing this patient by the request  of:    CC: Knee pain follow-up  [id number]   03/17/2018: Bilateral injections given today.  Discussed the possibility of Visco supplementation.  Patient will consider this at follow-up.  Discussed icing regimen and home exercise, discussed topical anti-inflammatories.  Patient declined bracing or formal physical therapy with everything else that is occurring in her life at the moment.  Follow-up with me again in 6 weeks and will consider the Visco supplementationBilateral injections given today.  Discussed the possibility of Visco supplementation.  Patient will consider this at follow-up.  Discussed icing regimen and home exercise, discussed topical anti-inflammatories.  Patient declined bracing or formal physical therapy with everything else that is occurring in her life at the moment.  Follow-up with me again in 6 weeks and will consider the Visco supplementation  Update 04/29/2018: Kayla Key is a 73 y.o. female coming in with complaint of bilateral knee pain. Patient states that the injections from last visit did not help. Her left is worse than her right. Is using tart cherry extract and turmeric.      Past Medical History:  Diagnosis Date  . Arthritis   . Asthma   . GERD (gastroesophageal reflux disease)   . Hypertension   . MHA (microangiopathic hemolytic anemia) (HCC)   . Osteoporosis    Past Surgical History:  Procedure Laterality Date  . BUNIONECTOMY Right   . KNEE ARTHROSCOPY Right 2009  . KNEE ARTHROSCOPY Left 2015  . TOE SURGERY Bilateral 2005  . TUBAL LIGATION  1988   Social History   Socioeconomic History  . Marital status: Widowed    Spouse name: Not on file  . Number of children: Not on file  . Years of education: Not on file  .  Highest education level: Not on file  Occupational History  . Not on file  Social Needs  . Financial resource strain: Not hard at all  . Food insecurity:    Worry: Never true    Inability: Never true  . Transportation needs:    Medical: No    Non-medical: No  Tobacco Use  . Smoking status: Never Smoker  . Smokeless tobacco: Never Used  Substance and Sexual Activity  . Alcohol use: No    Alcohol/week: 0.0 standard drinks  . Drug use: No  . Sexual activity: Not Currently  Lifestyle  . Physical activity:    Days per week: 5 days    Minutes per session: 40 min  . Stress: Only a little  Relationships  . Social connections:    Talks on phone: More than three times a week    Gets together: More than three times a week    Attends religious service: Not on file    Active member of club or organization: Not on file    Attends meetings of clubs or organizations: Not on file    Relationship status: Widowed  Other Topics Concern  . Not on file  Social History Narrative  . Not on file   No Known Allergies Family History  Problem Relation Age of Onset  . Stroke Father   . Hypertension Sister   . Breast cancer Sister   . Cancer Other        breast   . Colon cancer Neg  Hx      Current Outpatient Medications (Cardiovascular):  .  hydrochlorothiazide (HYDRODIURIL) 25 MG tablet, Take 1 tablet (25 mg total) by mouth daily. Overdue for annual appt must see provider for future refills .  nadolol (CORGARD) 20 MG tablet, TAKE 1 TABLET BY MOUTH EVERY DAY NEED     Current Outpatient Medications (Other):  Marland Kitchen  Multiple Vitamin (MULTI-VITAMIN DAILY PO), Take 1 tablet by mouth daily. Marland Kitchen  omeprazole (PRILOSEC) 20 MG capsule, TAKE 1 CAPSULE (20 MG TOTAL) BY MOUTH DAILY.    Past medical history, social, surgical and family history all reviewed in electronic medical record.  No pertanent information unless stated regarding to the chief complaint.   Review of Systems:  No headache, visual  changes, nausea, vomiting, diarrhea, constipation, dizziness, abdominal pain, skin rash, fevers, chills, night sweats, weight loss, swollen lymph nodes, body aches, joint swelling, chest pain, shortness of breath, mood changes.  Positive muscle aches  Objective  Blood pressure 118/88, height 5\' 2"  (1.575 m), weight 143 lb (64.9 kg).    General: No apparent distress alert and oriented x3 mood and affect normal, dressed appropriately.  HEENT: Pupils equal, extraocular movements intact  Respiratory: Patient's speak in full sentences and does not appear short of breath  Cardiovascular: No lower extremity edema, non tender, no erythema   Skin: Warm dry intact with no signs of infection or rash on extremities or on axial skeleton.  Abdomen: Soft nontender  Neuro: Cranial nerves II through XII are intact, neurovascularly intact in all extremities with 2+ DTRs and 2+ pulses.  Lymph: No lymphadenopathy of posterior or anterior cervical chain or axillae bilaterally.  Gait normal with good balance and coordination.  MSK:  Non tender with full range of motion and good stability and symmetric strength and tone of shoulders, elbows, wrist, hip, and ankles bilaterally.  Knee: Bilateral valgus deformity noted.  Abnormal thigh to calf ratio.  Tender to palpation over medial and PF joint line.  ROM full in flexion and extension and lower leg rotation. instability with valgus force.  painful patellar compression. Patellar glide with moderate crepitus. Patellar and quadriceps tendons unremarkable. Hamstring and quadriceps strength is normal.  After informed written and verbal consent, patient was seated on exam table. Right knee was prepped with alcohol swab and utilizing anterolateral approach, patient's right knee space was injected with monovisc 22mg /mL. Patient tolerated the procedure well without immediate complications.  After informed written and verbal consent, patient was seated on exam table. Left  knee was prepped with alcohol swab and utilizing anterolateral approach, patient's left knee space was injected with22mg /mL of Monovisc (sodium hyaluronate) in a prefilled syringe was injected easily into the knee through a 22-gauge needle..Patient tolerated the procedure well without immediate complications.   Impression and Recommendations:     This case required medical decision making of moderate complexity. The above documentation has been reviewed and is accurate and complete [name], DO       Note: This dictation was prepared with Dragon dictation along with smaller phrase technology. Any transcriptional errors that result from this process are unintentional.

## 2018-04-29 ENCOUNTER — Encounter: Payer: Self-pay | Admitting: Family Medicine

## 2018-04-29 ENCOUNTER — Other Ambulatory Visit: Payer: Self-pay

## 2018-04-29 ENCOUNTER — Ambulatory Visit (INDEPENDENT_AMBULATORY_CARE_PROVIDER_SITE_OTHER): Payer: Medicare Other | Admitting: Family Medicine

## 2018-04-29 DIAGNOSIS — M170 Bilateral primary osteoarthritis of knee: Secondary | ICD-10-CM

## 2018-04-29 NOTE — Assessment & Plan Note (Signed)
Bilateral Monovisc injections given today.  Discussed icing regimen and home exercise.  Discussed which activities of doing which wants to avoid.  Increase activity as tolerated.  Patient knows can repeat injections every 6 months if needed.  Patient will do a WebEx visit in 3 weeks to check in discussed next in person visit which we are avoiding at this point secondary to the corona outbreak

## 2018-04-29 NOTE — Patient Instructions (Addendum)
Good to see you  Tried monovisc today  Will take some time to work, up to a month but hopeful will get you some relief for 6-9 months  See you online in 3 weeks

## 2018-05-26 NOTE — Progress Notes (Signed)
Kayla Key  D.O. [facility] [address] [place], [place] [address] Phone: [phone] Subjective:    Virtual Visit via Video Note  I connected with Kayla ArMarilyn E Key on 05/26/18 at  8:00 AM EDT by a video enabled telemedicine application and verified that I am speaking with the correct person using two identifiers.   I discussed the limitations of evaluation and management by telemedicine and the availability of in person appointments. The patient expressed understanding and agreed to proceed.  Patient in home setting and I was in office setting. Only 2 on the virtual visit.     I discussed the assessment and treatment plan with the patient. The patient was provided an opportunity to ask questions and all were answered. The patient agreed with the plan and demonstrated an understanding of the instructions.   The patient was advised to call back or seek an in-person evaluation if the symptoms worsen or if the condition fails to improve as anticipated.  I provided 26 minutes of face-to-face time during this encounter.   Judi SaaZachary M , DO   CC: Bilateral knee pain follow-up  NGE:[id number]:Subjective  Kayla Key is a 73 y.o. female coming in with complaint of knee pain.  Known arthritis.  Failed all conservative viscosupplementation.  Discussed with patient states right knee is doing significantly better but left knee seems to make no improvement.  States that sometimes there is a sharp pain and sometimes increasing instability.  Patient states that sometimes she is concerned     Past Medical History:  Diagnosis Date  . Arthritis   . Asthma   . GERD (gastroesophageal reflux disease)   . Hypertension   . MHA (microangiopathic hemolytic anemia) (HCC)   . Osteoporosis    Past Surgical History:  Procedure Laterality Date  . BUNIONECTOMY Right   . KNEE ARTHROSCOPY Right 2009  . KNEE ARTHROSCOPY Left 2015  . TOE SURGERY Bilateral 2005  . TUBAL LIGATION  1988    Social History   Socioeconomic History  . Marital status: Widowed    Spouse name: Not on file  . Number of children: Not on file  . Years of education: Not on file  . Highest education level: Not on file  Occupational History  . Not on file  Social Needs  . Financial resource strain: Not hard at all  . Food insecurity:    Worry: Never true    Inability: Never true  . Transportation needs:    Medical: No    Non-medical: No  Tobacco Use  . Smoking status: Never Smoker  . Smokeless tobacco: Never Used  Substance and Sexual Activity  . Alcohol use: No    Alcohol/week: 0.0 standard drinks  . Drug use: No  . Sexual activity: Not Currently  Lifestyle  . Physical activity:    Days per week: 5 days    Minutes per session: 40 min  . Stress: Only a little  Relationships  . Social connections:    Talks on phone: More than three times a week    Gets together: More than three times a week    Attends religious service: Not on file    Active member of club or organization: Not on file    Attends meetings of clubs or organizations: Not on file    Relationship status: Widowed  Other Topics Concern  . Not on file  Social History Narrative  . Not on file   No Known Allergies Family History  Problem  Relation Age of Onset  . Stroke Father   . Hypertension Sister   . Breast cancer Sister   . Cancer Other        breast   . Colon cancer Neg Hx      Current Outpatient Medications (Cardiovascular):  .  hydrochlorothiazide (HYDRODIURIL) 25 MG tablet, Take 1 tablet (25 mg total) by mouth daily. Overdue for annual appt must see provider for future refills .  nadolol (CORGARD) 20 MG tablet, TAKE 1 TABLET BY MOUTH EVERY DAY NEED     Current Outpatient Medications (Other):  Marland Kitchen  Multiple Vitamin (MULTI-VITAMIN DAILY PO), Take 1 tablet by mouth daily. Marland Kitchen  omeprazole (PRILOSEC) 20 MG capsule, TAKE 1 CAPSULE (20 MG TOTAL) BY MOUTH DAILY.    Past medical history, social, surgical and  family history all reviewed in electronic medical record.  No pertanent information unless stated regarding to the chief complaint.   Review of Systems:  No headache, visual changes, nausea, vomiting, diarrhea, constipation, dizziness, abdominal pain, skin rash, fevers, chills, night sweats, weight loss, swollen lymph nodes, body aches, joint swelling, muscle aches, chest pain, shortness of breath, mood changes.   Objective      General: No apparent distress alert and oriented x3 mood and affect normal, dressed appropriately.  HEENT: Pupils equal, extraocular movements intact      Impression and Recommendations:     y. The above documentation has been reviewed and is accurate and complete [name], DO       Note: This dictation was prepared with Dragon dictation along with smaller phrase technology. Any transcriptional errors that result from this process are unintentional.

## 2018-05-27 ENCOUNTER — Ambulatory Visit (INDEPENDENT_AMBULATORY_CARE_PROVIDER_SITE_OTHER): Payer: Medicare Other | Admitting: Family Medicine

## 2018-05-27 ENCOUNTER — Encounter: Payer: Self-pay | Admitting: Family Medicine

## 2018-05-27 DIAGNOSIS — M170 Bilateral primary osteoarthritis of knee: Secondary | ICD-10-CM | POA: Diagnosis not present

## 2018-05-27 NOTE — Assessment & Plan Note (Signed)
Discussed with patient at great length.  We discussed with her about different treatment options.  Patient wants to avoid any surgical intervention.  Patient feels that the right knee made improvement and may be just more time for the left knee.  Because of the instability and the abnormal thigh to calf ratio patient will be fitted for custom brace.  Hoping that this will be beneficial as well.  Discussed icing regimen and home exercises.  Patient will follow-up with me in office in 4 weeks

## 2018-06-17 ENCOUNTER — Other Ambulatory Visit: Payer: Self-pay | Admitting: Internal Medicine

## 2018-06-17 DIAGNOSIS — Z1231 Encounter for screening mammogram for malignant neoplasm of breast: Secondary | ICD-10-CM

## 2018-06-22 NOTE — Progress Notes (Signed)
[name] [address] [place], [place] [address] Phone: [phone] Subjective:   [name], am serving as a scribe for Dr. Antoine Primas.  CC: knee pain follow up   [id number]   05/27/2018: Discussed with patient at great length.  We discussed with her about different treatment options.  Patient wants to avoid any surgical intervention.  Patient feels that the right knee made improvement and may be just more time for the left knee.  Because of the instability and the abnormal thigh to calf ratio patient will be fitted for custom brace.  Hoping that this will be beneficial as well.  Discussed icing regimen and home exercises.  Patient will follow-up with me in office in 4 weeks  Update 06/23/2018: Kayla Key is a 73 y.o. female coming in with complaint of bilateral knee pain with the left worse than the right. Does wear a brace on left leg which helps her pain but does not make it go away. Has not noticed a difference from visco injections.  Continues to have discomfort and pain overall.  Symptoms affecting daily activities.  Has not noticed any swelling.  Pain nose left greater than right.  Has been wearing the brace.   Viscosupplementation given April 29, 2018.  Past Medical History:  Diagnosis Date  . Arthritis   . Asthma   . GERD (gastroesophageal reflux disease)   . Hypertension   . MHA (microangiopathic hemolytic anemia) (HCC)   . Osteoporosis    Past Surgical History:  Procedure Laterality Date  . BUNIONECTOMY Right   . KNEE ARTHROSCOPY Right 2009  . KNEE ARTHROSCOPY Left 2015  . TOE SURGERY Bilateral 2005  . TUBAL LIGATION  1988   Social History   Socioeconomic History  . Marital status: Widowed    Spouse name: Not on file  . Number of children: Not on file  . Years of education: Not on file  . Highest education level: Not on file  Occupational History  . Not on file  Social Needs  . Financial resource strain: Not  hard at all  . Food insecurity:    Worry: Never true    Inability: Never true  . Transportation needs:    Medical: No    Non-medical: No  Tobacco Use  . Smoking status: Never Smoker  . Smokeless tobacco: Never Used  Substance and Sexual Activity  . Alcohol use: No    Alcohol/week: 0.0 standard drinks  . Drug use: No  . Sexual activity: Not Currently  Lifestyle  . Physical activity:    Days per week: 5 days    Minutes per session: 40 min  . Stress: Only a little  Relationships  . Social connections:    Talks on phone: More than three times a week    Gets together: More than three times a week    Attends religious service: Not on file    Active member of club or organization: Not on file    Attends meetings of clubs or organizations: Not on file    Relationship status: Widowed  Other Topics Concern  . Not on file  Social History Narrative  . Not on file   No Known Allergies Family History  Problem Relation Age of Onset  . Stroke Father   . Hypertension Sister   . Breast cancer Sister   . Cancer Other        breast   . Colon cancer Neg Hx  Current Outpatient Medications (Cardiovascular):  .  hydrochlorothiazide (HYDRODIURIL) 25 MG tablet, Take 1 tablet (25 mg total) by mouth daily. Overdue for annual appt must see provider for future refills .  nadolol (CORGARD) 20 MG tablet, TAKE 1 TABLET BY MOUTH EVERY DAY NEED   Current Outpatient Medications (Analgesics):  .  meloxicam (MOBIC) 15 MG tablet, Take 1 tablet (15 mg total) by mouth daily.   Current Outpatient Medications (Other):  Marland Kitchen.  Multiple Vitamin (MULTI-VITAMIN DAILY PO), Take 1 tablet by mouth daily. Marland Kitchen.  omeprazole (PRILOSEC) 20 MG capsule, TAKE 1 CAPSULE (20 MG TOTAL) BY MOUTH DAILY.    Past medical history, social, surgical and family history all reviewed in electronic medical record.  No pertanent information unless stated regarding to the chief complaint.   Review of Systems:  No headache, visual  changes, nausea, vomiting, diarrhea, constipation, dizziness, abdominal pain, skin rash, fevers, chills, night sweats, weight loss, swollen lymph nodes, body aches, joint swelling, chest pain, shortness of breath, mood changes.  Positive muscle aches  Objective  Blood pressure 122/86, height 5\' 2"  (1.575 m), weight 65.8 kg.    General: No apparent distress alert and oriented x3 mood and affect normal, dressed appropriately.  HEENT: Pupils equal, extraocular movements intact  Respiratory: Patient's speak in full sentences and does not appear short of breath  Cardiovascular: No lower extremity edema, non tender, no erythema  Skin: Warm dry intact with no signs of infection or rash on extremities or on axial skeleton.  Abdomen: Soft nontender  Neuro: Cranial nerves II through XII are intact, neurovascularly intact in all extremities with 2+ DTRs and 2+ pulses.  Lymph: No lymphadenopathy of posterior or anterior cervical chain or axillae bilaterally.  Gait normal with good balance and coordination.  MSK:  Non tender with full range of motion and good stability and symmetric strength and tone of shoulders, elbows, wrist, hip, and ankles bilaterally.  Knee:bilateral  Varus deformity noted.  Abnormal thigh to calf ratio.  Tender to palpation over medial and PF joint line.  ROM full in flexion and extension and lower leg rotation. instability with valgus force.  painful patellar compression. Patellar glide with moderate crepitus. Patellar and quadriceps tendons unremarkable. Hamstring and quadriceps strength is normal.  After informed written and verbal consent, patient was seated on exam table. Right knee was prepped with alcohol swab and utilizing anterolateral approach, patient's right knee space was injected with 4:1  marcaine 0.5%: Kenalog 40mg /dL. Patient tolerated the procedure well without immediate complications.  After informed written and verbal consent, patient was seated on exam  table. Left knee was prepped with alcohol swab and utilizing anterolateral approach, patient's left knee space was injected with 4:1  marcaine 0.5%: Kenalog 40mg /dL. Patient tolerated the procedure well without immediate complications.   Impression and Recommendations:     This case required medical decision making of moderate complexity. The above documentation has been reviewed and is accurate and complete Judi SaaZachary M Mega Kinkade, DO       Note: This dictation was prepared with Dragon dictation along with smaller phrase technology. Any transcriptional errors that result from this process are unintentional.

## 2018-06-23 ENCOUNTER — Encounter: Payer: Self-pay | Admitting: Family Medicine

## 2018-06-23 ENCOUNTER — Ambulatory Visit (INDEPENDENT_AMBULATORY_CARE_PROVIDER_SITE_OTHER): Payer: Medicare Other | Admitting: Family Medicine

## 2018-06-23 ENCOUNTER — Other Ambulatory Visit: Payer: Self-pay

## 2018-06-23 VITALS — BP 122/86 | Ht 62.0 in | Wt 145.0 lb

## 2018-06-23 DIAGNOSIS — M170 Bilateral primary osteoarthritis of knee: Secondary | ICD-10-CM

## 2018-06-23 MED ORDER — MELOXICAM 15 MG PO TABS: 15.0000 mg | ORAL_TABLET | Freq: Every day | ORAL | 3 refills | Status: DC

## 2018-06-23 NOTE — Patient Instructions (Addendum)
Good to see you  Ice is your friend Injected knees again .  Read about PRP  Meloxicam daily for 10 days then as needed when you have pain. Stop if it hurts your stomach  See me again in 2 months

## 2018-06-23 NOTE — Assessment & Plan Note (Signed)
Steroid injection given again today.  Hopefully this will be relatively well.  We discussed with patient about PRP.  Start formal physical therapy, meloxicam given for breakthrough pain.  Discussed icing regimen and home exercise.  Follow-up again in 4 to 6 weeks.

## 2018-07-01 ENCOUNTER — Ambulatory Visit: Payer: Medicare Other | Attending: Family Medicine | Admitting: Physical Therapy

## 2018-07-01 ENCOUNTER — Ambulatory Visit: Payer: Medicare Other | Admitting: Physical Therapy

## 2018-07-01 ENCOUNTER — Other Ambulatory Visit: Payer: Self-pay

## 2018-07-01 ENCOUNTER — Encounter: Payer: Self-pay | Admitting: Physical Therapy

## 2018-07-01 DIAGNOSIS — G8929 Other chronic pain: Secondary | ICD-10-CM | POA: Diagnosis not present

## 2018-07-01 DIAGNOSIS — M6281 Muscle weakness (generalized): Secondary | ICD-10-CM | POA: Diagnosis not present

## 2018-07-01 DIAGNOSIS — M25562 Pain in left knee: Secondary | ICD-10-CM | POA: Insufficient documentation

## 2018-07-01 NOTE — Therapy (Signed)
[facility] [address] [place], [place], [address] Phone: [phone]   Fax:  [phone]  Physical Therapy Evaluation  Patient Details  Name: Kayla Key MRN: [id number] Date of Birth: October 23, 1945 Referring Provider (PT): [name], DO   Encounter Date: 07/01/2018  PT End of Session - 07/01/18 1533    Visit Number  1    Number of Visits  7    Date for PT Re-Evaluation  08/12/18    Authorization Type  MCR: KX mod by 15th visit, progress note by 10th visit.     PT Start Time  1530    PT Stop Time  1610    PT Time Calculation (min)  40 min    Activity Tolerance  Patient tolerated treatment well    Behavior During Therapy  WFL for tasks assessed/performed       Past Medical History:  Diagnosis Date  . Arthritis   . Asthma   . GERD (gastroesophageal reflux disease)   . Hypertension   . MHA (microangiopathic hemolytic anemia) (HCC)   . Osteoporosis     Past Surgical History:  Procedure Laterality Date  . BUNIONECTOMY Right   . KNEE ARTHROSCOPY Right 2009  . KNEE ARTHROSCOPY Left 2015  . TOE SURGERY Bilateral 2005  . TUBAL LIGATION  1988    There were no vitals filed for this visit.   Subjective Assessment - 07/01/18 1528    Subjective  pt is a 73 y.o F with CC of bil knee pain with the L>R starting about 6 months ago with, with specific onset. she was waslking ~ 1-2 miles every day. pain stays in the knee and she denies N/T. Hx of meniscal involvement with previous arthroscopy.  pt denis any clicking/ popping or buckling    Limitations  Standing;Walking    How long can you sit comfortably?  unlimited    How long can you stand comfortably?  unlimited    How long can you walk comfortably?  15-20 min    Diagnostic tests  at MD's office    Patient Stated Goals  to be able to walk, perform yard work, with no limitation    Currently in Pain?  Yes    Pain Score  2    at worst 8/10. L ast took meds for pain  at 11am   Pain Location  Knee    Pain Orientation  Right;Left   L>R   Pain Descriptors / Indicators  Sore;Sharp    Pain Type  Chronic pain    Pain Onset  More than a month ago    Pain Frequency  Intermittent    Aggravating Factors   walking    Pain Relieving Factors  medication, ice pack    Effect of Pain on Daily Activities  limited walking endurance         [facility] PT Assessment - 07/01/18 1528      Assessment   Medical Diagnosis  Osteoarthritis of both knees, unspecified osteoarthritis type     Referring Provider (PT)  [name], DO    Onset Date/Surgical Date  --   6 months   Hand Dominance  Right    Next MD Visit  --   6 weeks   Prior Therapy  yes   for meniscus     Precautions   Precautions  None      Restrictions   Weight Bearing Restrictions  No      Balance Screen  Has the patient fallen in the past 6 months  No      Home Environment   Living Environment  Private residence    Living Arrangements  Children    Available Help at Discharge  Family    Type of Home  House    Home Access  Stairs to enter    Entrance Stairs-Number of Steps  2    Entrance Stairs-Rails  Can reach both    Home Layout  One level    Home Equipment  None      Prior Function   Level of Independence  Independent with basic ADLs    Vocation  Retired    Leisure  walking, yard work, going to the park      Cognition   Overall Cognitive Status  Within Functional Limits for tasks assessed      Observation/Other Assessments   Focus on Therapeutic Outcomes (FOTO)   45% limited   predicted 39% limited     Posture/Postural Control   Posture/Postural Control  Postural limitations    Postural Limitations  Rounded Shoulders;Forward head      ROM / Strength   AROM / PROM / Strength  Strength;AROM;PROM      AROM   AROM Assessment Site  Knee    Right/Left Knee  Right;Left      Strength   Strength Assessment Site  Hip;Knee    Right/Left Hip  Right;Left    Right Hip Flexion  4/5     Right Hip ABduction  4-/5    Right Hip ADduction  4/5    Left Hip Flexion  4/5    Left Hip ABduction  4/5    Left Hip ADduction  4/5    Right/Left Knee  Right;Left    Right Knee Flexion  4+/5    Right Knee Extension  4/5    Left Knee Flexion  4+/5    Left Knee Extension  4/5   soreness during testing     Palpation   Palpation comment  TTP along the semi-tendinosus and at the pes anserine      Ambulation/Gait   Ambulation/Gait  Yes    Gait Pattern  Step-through pattern;Trendelenburg;Antalgic                Objective measurements completed on examination: See above findings.      [facility] Adult PT Treatment/Exercise - 07/01/18 1528      Exercises   Exercises  Knee/Hip      Knee/Hip Exercises: Stretches   Active Hamstring Stretch  2 reps;30 seconds      Knee/Hip Exercises: Seated   Sit to Sand  1 set;10 reps;without UE support      Knee/Hip Exercises: Supine   Bridges with Clamshell  Strengthening;Both;1 set;10 reps   with green theraband            PT Education - 07/01/18 1533    Education Details  evaluation findings, POC goals, HEP with proper form/ rationale    Person(s) Educated  Patient    Methods  Explanation;Verbal cues;Handout    Comprehension  Verbalized understanding;Verbal cues required       PT Short Term Goals - 07/01/18 1619      PT SHORT TERM GOAL #1   Title  pt to be I with inital HEP     Time  3    Period  Weeks    Status  New    Target Date  07/22/18  PT Long Term Goals - 07/01/18 1619      PT LONG TERM GOAL #1   Title  pt to incresae bil hip/ knee strength grossly to >/= 4+/5 to promote knee stability and proper gait mechanics    Time  6    Period  Weeks    Status  New    Target Date  08/12/18      PT LONG TERM GOAL #2   Title  pt to be able to walk >/= 45 min for functional mobility and endurance    Time  6    Period  Weeks    Status  New    Target Date  08/12/18      PT LONG TERM GOAL #3   Title   pt to be able to resume walking, and yard work with no report of pain per pt's personal goals.     Time  6    Period  Weeks    Status  New    Target Date  08/12/18      PT LONG TERM GOAL #4   Title  increase FOTO score to </= 39% limited to demo improvement in function    Time  6    Period  Weeks    Status  New    Target Date  08/12/18      PT LONG TERM GOAL #5   Title  pt to be I with all HEP given as of last visit to maintain and promote current level function     Time  6    Period  Weeks    Status  New    Target Date  08/12/18             Plan - 07/01/18 1613    Clinical Impression Statement  pt presents to [facility] with CC or bil knee pain with L>R starting 6 months ago with insidious onset. she demonstrates functional ROM in bil knees and mild strength deficits in bil knee/ hips especially in R hip abductors which likely contributes to mild trendelendberg gait. TTP noted at the pes anserine and along the semi-tendinosus. pt would benefit from physical therapy to decrease knee pain, increase strength and walking endurance and maximize overall function by addressing the deficits listed.    Personal Factors and Comorbidities  Age;Comorbidity 2    Comorbidities  hx or osteoporsis and surgical hx or bil meniscal arthroscopic surgery    Examination-Activity Limitations  Other   walking   Stability/Clinical Decision Making  Evolving/Moderate complexity    Clinical Decision Making  Moderate    Rehab Potential  Good    PT Frequency  1x / week    PT Duration  6 weeks    PT Treatment/Interventions  ADLs/Self Care Home Management;Cryotherapy;Electrical Stimulation;Moist Heat;Iontophoresis 4mg /ml Dexamethasone;Ultrasound;Therapeutic exercise;Gait training;Therapeutic activities;Stair training;Patient/family education;Dry needling;Taping;Manual techniques;Balance training;Neuromuscular re-education;Passive range of motion    PT Next Visit Plan  review/ update HEP, trial ionot patch (if  signed) at the pes anserine, hip strengthening, hamstring stretching, STW PRN along semi-tendinosus on the L    PT Home Exercise Plan  sidelying hip abduction, seated/ supine hamstring stretching, bridge with clam, sit to stand    Consulted and Agree with Plan of Care  Patient       Patient will benefit from skilled therapeutic intervention in order to improve the following deficits and impairments:  Abnormal gait, Pain, Decreased strength, Improper body mechanics, Postural dysfunction, Decreased activity tolerance  Visit Diagnosis: Chronic pain of  left knee  Muscle weakness (generalized)     Problem List Patient Active Problem List   Diagnosis Date Noted  . Degenerative arthritis of knee, bilateral 09/30/2017  . Chronic pain of both knees 09/18/2017  . Routine general medical examination at a health care facility 10/17/2014  . Reflux esophagitis 03/01/2013  . Essential hypertension 07/08/2006  . Osteopenia 07/08/2006   Lulu Riding PT, DPT, LAT, ATC  07/01/18  4:28 PM      [facility] [address] [place], [place], [address] Phone: [phone]   Fax:  [phone]  Name: Kayla Key MRN: [id number] Date of Birth: 03/09/1945

## 2018-07-08 ENCOUNTER — Encounter: Payer: Self-pay | Admitting: Physical Therapy

## 2018-07-08 ENCOUNTER — Ambulatory Visit: Payer: Medicare Other | Admitting: Physical Therapy

## 2018-07-08 ENCOUNTER — Other Ambulatory Visit: Payer: Self-pay

## 2018-07-08 DIAGNOSIS — G8929 Other chronic pain: Secondary | ICD-10-CM | POA: Diagnosis not present

## 2018-07-08 DIAGNOSIS — M25562 Pain in left knee: Secondary | ICD-10-CM | POA: Diagnosis not present

## 2018-07-08 DIAGNOSIS — M6281 Muscle weakness (generalized): Secondary | ICD-10-CM

## 2018-07-08 NOTE — Patient Instructions (Addendum)

## 2018-07-08 NOTE — Therapy (Addendum)
[place], [place], [address] Phone: [phone]   Fax:  [phone]  Physical Therapy Treatment  Patient Details  Name: Kayla Key MRN: [id number] Date of Birth: 07/16/1945 Referring Provider (PT): Lyndal Pulley, DO   Encounter Date: 07/08/2018  PT End of Session - 07/08/18 1135    Visit Number  2    Number of Visits  7    Date for PT Re-Evaluation  08/12/18    Authorization Type  MCR: KX mod by 15th visit, progress note by 10th visit.     PT Start Time  1128    PT Stop Time  1206    PT Time Calculation (min)  38 min       Past Medical History:  Diagnosis Date  . Arthritis   . Asthma   . GERD (gastroesophageal reflux disease)   . Hypertension   . MHA (microangiopathic hemolytic anemia) (HCC)   . Osteoporosis     Past Surgical History:  Procedure Laterality Date  . BUNIONECTOMY Right   . KNEE ARTHROSCOPY Right 2009  . KNEE ARTHROSCOPY Left 2015  . TOE SURGERY Bilateral 2005  . TUBAL LIGATION  1988    There were no vitals filed for this visit.  Subjective Assessment - 07/08/18 1131    Subjective  Knees have been better. A little more pain today. 3/10     Currently in Pain?  Yes    Pain Score  3    2/10 left   Pain Location  Knee    Pain Orientation  Right;Left    Pain Descriptors / Indicators  Aching;Sore    Aggravating Factors   walking, turning quickly     Pain Relieving Factors  meds, ice, be careful                       [facility] Adult PT Treatment/Exercise - 07/08/18 0001      Knee/Hip Exercises: Stretches   Active Hamstring Stretch  2 reps;30 seconds      Knee/Hip Exercises: Aerobic   Recumbent Bike  L2 x 5 minutes       Knee/Hip Exercises: Seated   Sit to Sand  1 set;10 reps;without UE support   no UE support      Knee/Hip Exercises: Supine   Bridges with Clamshell  Strengthening;Both;10 reps;2 sets   with green theraband   Straight Leg Raises   2 sets;10 reps      Knee/Hip Exercises: Sidelying   Clams  green band x 20,R, L      Modalities:  Iontophoresis: Dexamethasone 38ml to left medial knee via 6 hour stat patch       PT Education - 07/08/18 1155    Education Details  HEP/ IONTO Education    Person(s) Educated  Patient    Methods  Explanation;Handout    Comprehension  Verbalized understanding;Returned demonstration       PT Short Term Goals - 07/01/18 1619      PT SHORT TERM GOAL #1   Title  pt to be I with inital HEP     Time  3    Period  Weeks    Status  New    Target Date  07/22/18        PT Long Term Goals - 07/01/18 1619      PT LONG TERM GOAL #1   Title  pt to incresae bil hip/ knee strength grossly to >/=  4+/5 to promote knee stability and proper gait mechanics    Time  6    Period  Weeks    Status  New    Target Date  08/12/18      PT LONG TERM GOAL #2   Title  pt to be able to walk >/= 45 min for functional mobility and endurance    Time  6    Period  Weeks    Status  New    Target Date  08/12/18      PT LONG TERM GOAL #3   Title  pt to be able to resume walking, and yard work with no report of pain per pt's personal goals.     Time  6    Period  Weeks    Status  New    Target Date  08/12/18      PT LONG TERM GOAL #4   Title  increase FOTO score to </= 39% limited to demo improvement in function    Time  6    Period  Weeks    Status  New    Target Date  08/12/18      PT LONG TERM GOAL #5   Title  pt to be I with all HEP given as of last visit to maintain and promote current level function     Time  6    Period  Weeks    Status  New    Target Date  08/12/18            Plan - 07/08/18 1300    Clinical Impression Statement  Pt reports less pain. She is sore and medial knee. Reviewed HEP and suggested more reps as she has no difficulty. Progressed to side clam and SLR. Trial of iontophoresis to medial left knee. Discussed ionto precautions and reasons to remove if  needed. Pt verbalized understanding.     PT Next Visit Plan  review/ update HEP, trial ionot patch (if signed) at the pes anserine, hip strengthening, hamstring stretching, STW PRN along semi-tendinosus on the L    PT Home Exercise Plan  sidelying hip abduction, seated/ supine hamstring stretching, bridge with clam, sit to stand, side clam green band, SLR     Consulted and Agree with Plan of Care  Patient       Patient will benefit from skilled therapeutic intervention in order to improve the following deficits and impairments:  Abnormal gait, Pain, Decreased strength, Improper body mechanics, Postural dysfunction, Decreased activity tolerance  Visit Diagnosis: Chronic pain of left knee  Muscle weakness (generalized)     Problem List Patient Active Problem List   Diagnosis Date Noted  . Degenerative arthritis of knee, bilateral 09/30/2017  . Chronic pain of both knees 09/18/2017  . Routine general medical examination at a health care facility 10/17/2014  . Reflux esophagitis 03/01/2013  . Essential hypertension 07/08/2006  . Osteopenia 07/08/2006    Sherrie Mustacheonoho, Keiland Pickering McGee, PTA 07/08/2018, 1:06 PM  [facility] [address] [place], [place], [address] Phone: [phone]   Fax:  [phone]  Name: Kayla Key MRN: [id number] Date of Birth: 04-Oct-1945

## 2018-07-15 ENCOUNTER — Other Ambulatory Visit: Payer: Self-pay

## 2018-07-15 ENCOUNTER — Ambulatory Visit: Payer: Medicare Other | Admitting: Physical Therapy

## 2018-07-15 ENCOUNTER — Encounter: Payer: Self-pay | Admitting: Physical Therapy

## 2018-07-15 DIAGNOSIS — M25562 Pain in left knee: Secondary | ICD-10-CM | POA: Diagnosis not present

## 2018-07-15 DIAGNOSIS — M6281 Muscle weakness (generalized): Secondary | ICD-10-CM

## 2018-07-15 DIAGNOSIS — G8929 Other chronic pain: Secondary | ICD-10-CM

## 2018-07-15 NOTE — Therapy (Signed)
[place], [place], [address] Phone: [phone]   Fax:  [phone]  Physical Therapy Treatment  Patient Details  Name: Kayla Key MRN: [id number] Date of Birth: 05-08-1945 Referring Provider (PT): Lyndal Pulley, DO   Encounter Date: 07/15/2018  PT End of Session - 07/15/18 0835    Visit Number  3    Number of Visits  7    Date for PT Re-Evaluation  08/12/18    PT Start Time  0830    PT Stop Time  0910    PT Time Calculation (min)  40 min       Past Medical History:  Diagnosis Date  . Arthritis   . Asthma   . GERD (gastroesophageal reflux disease)   . Hypertension   . MHA (microangiopathic hemolytic anemia) (HCC)   . Osteoporosis     Past Surgical History:  Procedure Laterality Date  . BUNIONECTOMY Right   . KNEE ARTHROSCOPY Right 2009  . KNEE ARTHROSCOPY Left 2015  . TOE SURGERY Bilateral 2005  . TUBAL LIGATION  1988    There were no vitals filed for this visit.  Subjective Assessment - 07/15/18 0834    Subjective  I think the patch helped a little. Inside of left knee not as sore. Doing better with waking. It still hurts if I turn fast.    Currently in Pain?  Yes    Pain Score  4     Pain Location  Knee    Pain Orientation  Left    Pain Descriptors / Indicators  Sore;Aching    Pain Type  Chronic pain    Aggravating Factors   twisting funny    Pain Relieving Factors  meds, ice, be careful                       [facility] Adult PT Treatment/Exercise - 07/15/18 0001      Knee/Hip Exercises: Stretches   Active Hamstring Stretch  2 reps;30 seconds    Active Hamstring Stretch Limitations  seated    Hip Flexor Stretch  Left;1 rep;60 seconds    Gastroc Stretch  2 reps;30 seconds    Gastroc Stretch Limitations  runners stretch      Knee/Hip Exercises: Aerobic   Recumbent Bike  L2 x 5 minutes       Knee/Hip Exercises: Standing   Heel Raises  20 reps    Other  Standing Knee Exercises  3 way hip standing bilateral      Knee/Hip Exercises: Seated   Sit to Sand  2 sets;10 reps;without UE support   no UE support      Knee/Hip Exercises: Supine   Bridges with Clamshell  Strengthening;Both;10 reps;2 sets   with green theraband   Straight Leg Raises  2 sets;10 reps      Knee/Hip Exercises: Sidelying   Clams  green band x 20,R, L       Modalities   Modalities  Iontophoresis      Iontophoresis   Type of Iontophoresis  Dexamethasone    Location  L medial knee    Dose  30m    Time  6 hours       Manual Therapy   Manual Therapy  Soft tissue mobilization    Soft tissue mobilization  Massage roller to medial hamstring, adductor and VMO, instruction on use of small roller for home  PT Short Term Goals - 07/15/18 0847      PT SHORT TERM GOAL #1   Title  pt to be I with inital HEP     Time  3    Period  Weeks    Status  Achieved        PT Long Term Goals - 07/01/18 1619      PT LONG TERM GOAL #1   Title  pt to incresae bil hip/ knee strength grossly to >/= 4+/5 to promote knee stability and proper gait mechanics    Time  6    Period  Weeks    Status  New    Target Date  08/12/18      PT LONG TERM GOAL #2   Title  pt to be able to walk >/= 45 min for functional mobility and endurance    Time  6    Period  Weeks    Status  New    Target Date  08/12/18      PT LONG TERM GOAL #3   Title  pt to be able to resume walking, and yard work with no report of pain per pt's personal goals.     Time  6    Period  Weeks    Status  New    Target Date  08/12/18      PT LONG TERM GOAL #4   Title  increase FOTO score to </= 39% limited to demo improvement in function    Time  6    Period  Weeks    Status  New    Target Date  08/12/18      PT LONG TERM GOAL #5   Title  pt to be I with all HEP given as of last visit to maintain and promote current level function     Time  6    Period  Weeks    Status  New    Target  Date  08/12/18            Plan - 07/15/18 0847    Clinical Impression Statement  Independent with initial HEP. STG# 1 met. She reports less pain at medial knee on left and less overall pain with walking. She does note increased pain turning quickly on feet. Progressed with closed chain exercises and performed manual to medial knee musculature. Repeated IONTO patch to medial knee.    PT Next Visit Plan  review/ update HEP, continue ionto at the pes anserine, hip strengthening, hamstring stretching, STW PRN along semi-tendinosus on the L    PT Home Exercise Plan  sidelying hip abduction, seated/ supine hamstring stretching, bridge with clam, sit to stand, side clam green band, SLR        Patient will benefit from skilled therapeutic intervention in order to improve the following deficits and impairments:  Abnormal gait, Pain, Decreased strength, Improper body mechanics, Postural dysfunction, Decreased activity tolerance  Visit Diagnosis: 1. Chronic pain of left knee   2. Muscle weakness (generalized)        Problem List Patient Active Problem List   Diagnosis Date Noted  . Degenerative arthritis of knee, bilateral 09/30/2017  . Chronic pain of both knees 09/18/2017  . Routine general medical examination at a health care facility 10/17/2014  . Reflux esophagitis 03/01/2013  . Essential hypertension 07/08/2006  . Osteopenia 07/08/2006    Dorene Ar, PTA 07/15/2018, 9:14 AM  [place]  [place], [place], [address] Phone: [phone]   Fax:  [phone]  Name: JERZY CROTTEAU MRN: [id number] Date of Birth: 07-Nov-1945

## 2018-07-22 ENCOUNTER — Encounter: Payer: Self-pay | Admitting: Physical Therapy

## 2018-07-22 ENCOUNTER — Ambulatory Visit: Payer: Medicare Other | Admitting: Physical Therapy

## 2018-07-22 ENCOUNTER — Other Ambulatory Visit: Payer: Self-pay

## 2018-07-22 DIAGNOSIS — G8929 Other chronic pain: Secondary | ICD-10-CM | POA: Diagnosis not present

## 2018-07-22 DIAGNOSIS — M6281 Muscle weakness (generalized): Secondary | ICD-10-CM | POA: Diagnosis not present

## 2018-07-22 DIAGNOSIS — M25562 Pain in left knee: Secondary | ICD-10-CM | POA: Diagnosis not present

## 2018-07-22 NOTE — Therapy (Signed)
[place], [place], [address] Phone: [phone]   Fax:  [phone]  Physical Therapy Treatment  Patient Details  Name: Kayla Key MRN: [id number] Date of Birth: May 17, 1945 Referring Provider (PT): Lyndal Pulley, DO   Encounter Date: 07/22/2018  PT End of Session - 07/22/18 0832    Visit Number  4    Number of Visits  7    Date for PT Re-Evaluation  08/12/18    PT Start Time  0827    PT Stop Time  0850    PT Time Calculation (min)  23 min       Past Medical History:  Diagnosis Date  . Arthritis   . Asthma   . GERD (gastroesophageal reflux disease)   . Hypertension   . MHA (microangiopathic hemolytic anemia) (HCC)   . Osteoporosis     Past Surgical History:  Procedure Laterality Date  . BUNIONECTOMY Right   . KNEE ARTHROSCOPY Right 2009  . KNEE ARTHROSCOPY Left 2015  . TOE SURGERY Bilateral 2005  . TUBAL LIGATION  1988    There were no vitals filed for this visit.  Subjective Assessment - 07/22/18 0829    Subjective  I had a bad day yesterday and I had a bad cramp in my right leg.    Currently in Pain?  Yes    Pain Score  6     Pain Location  Knee    Pain Orientation  Left    Pain Descriptors / Indicators  Sore;Aching    Aggravating Factors   being up on my feet alot    Pain Relieving Factors  meds, ice, be careful                       [facility] Adult PT Treatment/Exercise - 07/22/18 0001      Knee/Hip Exercises: Stretches   Gastroc Stretch  2 reps;30 seconds    Gastroc Stretch Limitations  runners stretch      Knee/Hip Exercises: Aerobic   Recumbent Bike  L2 x 5 minutes       Knee/Hip Exercises: Standing   Heel Raises  10 reps    Other Standing Knee Exercises  3 way hip standing bilateral      Knee/Hip Exercises: Seated   Sit to Sand  2 sets;10 reps;without UE support   no UE support      Knee/Hip Exercises: Supine   Bridges with Clamshell   Strengthening;Both;10 reps;2 sets   with green theraband   Straight Leg Raises  2 sets;10 reps      Knee/Hip Exercises: Sidelying   Clams  green band x 20,R, L       Iontophoresis   Type of Iontophoresis  Dexamethasone    Location  L medial knee    Dose  55ml    Time  6 hours                PT Short Term Goals - 07/15/18 0847      PT SHORT TERM GOAL #1   Title  pt to be I with inital HEP     Time  3    Period  Weeks    Status  Achieved        PT Long Term Goals - 07/01/18 1619      PT LONG TERM GOAL #1   Title  pt to incresae bil hip/ knee strength grossly to >/= 4+/5  to promote knee stability and proper gait mechanics    Time  6    Period  Weeks    Status  New    Target Date  08/12/18      PT LONG TERM GOAL #2   Title  pt to be able to walk >/= 45 min for functional mobility and endurance    Time  6    Period  Weeks    Status  New    Target Date  08/12/18      PT LONG TERM GOAL #3   Title  pt to be able to resume walking, and yard work with no report of pain per pt's personal goals.     Time  6    Period  Weeks    Status  New    Target Date  08/12/18      PT LONG TERM GOAL #4   Title  increase FOTO score to </= 39% limited to demo improvement in function    Time  6    Period  Weeks    Status  New    Target Date  08/12/18      PT LONG TERM GOAL #5   Title  pt to be I with all HEP given as of last visit to maintain and promote current level function     Time  6    Period  Weeks    Status  New    Target Date  08/12/18            Plan - 07/22/18 0901    Clinical Impression Statement  Pt arrives today and reports increased left knee pain yesterday with alot of stress, activity and cleaning. She reports less compliance with HEP over last few days due to foster child leaving her home and the cleaning required afterward. She requests to leave early today to babysit her grandchildren.    PT Next Visit Plan  review/ update HEP, continue ionto at  the pes anserine, hip strengthening, hamstring stretching, STW PRN along semi-tendinosus on the L    PT Home Exercise Plan  sidelying hip abduction, seated/ supine hamstring stretching, bridge with clam, sit to stand, side clam green band, SLR        Patient will benefit from skilled therapeutic intervention in order to improve the following deficits and impairments:  Abnormal gait, Pain, Decreased strength, Improper body mechanics, Postural dysfunction, Decreased activity tolerance  Visit Diagnosis: 1. Chronic pain of left knee   2. Muscle weakness (generalized)        Problem List Patient Active Problem List   Diagnosis Date Noted  . Degenerative arthritis of knee, bilateral 09/30/2017  . Chronic pain of both knees 09/18/2017  . Routine general medical examination at a health care facility 10/17/2014  . Reflux esophagitis 03/01/2013  . Essential hypertension 07/08/2006  . Osteopenia 07/08/2006    Sherrie Mustacheonoho, Mianna Iezzi McGee, PTA 07/22/2018, 9:03 AM  [facility] [address] [place], [place], [address] Phone: [phone]   Fax:  [phone]  Name: Kayla Key MRN: [id number] Date of Birth: 1945-08-21

## 2018-07-29 ENCOUNTER — Encounter: Payer: Self-pay | Admitting: Physical Therapy

## 2018-07-29 ENCOUNTER — Other Ambulatory Visit: Payer: Self-pay

## 2018-07-29 ENCOUNTER — Ambulatory Visit: Payer: Medicare Other | Attending: Family Medicine | Admitting: Physical Therapy

## 2018-07-29 DIAGNOSIS — M25562 Pain in left knee: Secondary | ICD-10-CM | POA: Diagnosis not present

## 2018-07-29 DIAGNOSIS — G8929 Other chronic pain: Secondary | ICD-10-CM

## 2018-07-29 DIAGNOSIS — M6281 Muscle weakness (generalized): Secondary | ICD-10-CM | POA: Diagnosis not present

## 2018-07-29 NOTE — Therapy (Addendum)
[place], [place], [address] Phone: [phone]   Fax:  [phone]  Physical Therapy Treatment / discharge  Patient Details  Name: Kayla Key MRN: [id number] Date of Birth: May 11, 1945 Referring Provider (PT): Lyndal Pulley, DO   Encounter Date: 07/29/2018    Past Medical History:  Diagnosis Date  . Arthritis   . Asthma   . GERD (gastroesophageal reflux disease)   . Hypertension   . MHA (microangiopathic hemolytic anemia) (HCC)   . Osteoporosis     Past Surgical History:  Procedure Laterality Date  . BUNIONECTOMY Right   . KNEE ARTHROSCOPY Right 2009  . KNEE ARTHROSCOPY Left 2015  . TOE SURGERY Bilateral 2005  . TUBAL LIGATION  1988    There were no vitals filed for this visit.                              PT Short Term Goals - 07/15/18 0847      PT SHORT TERM GOAL #1   Title  pt to be I with inital HEP     Time  3    Period  Weeks    Status  Achieved        PT Long Term Goals - 07/01/18 1619      PT LONG TERM GOAL #1   Title  pt to incresae bil hip/ knee strength grossly to >/= 4+/5 to promote knee stability and proper gait mechanics    Time  6    Period  Weeks    Status  New    Target Date  08/12/18      PT LONG TERM GOAL #2   Title  pt to be able to walk >/= 45 min for functional mobility and endurance    Time  6    Period  Weeks    Status  New    Target Date  08/12/18      PT LONG TERM GOAL #3   Title  pt to be able to resume walking, and yard work with no report of pain per pt's personal goals.     Time  6    Period  Weeks    Status  New    Target Date  08/12/18      PT LONG TERM GOAL #4   Title  increase FOTO score to </= 39% limited to demo improvement in function    Time  6    Period  Weeks    Status  New    Target Date  08/12/18      PT LONG TERM GOAL #5   Title  pt to be I with all HEP given as of last visit to  maintain and promote current level function     Time  6    Period  Weeks    Status  New    Target Date  08/12/18              Patient will benefit from skilled therapeutic intervention in order to improve the following deficits and impairments:     Visit Diagnosis: 1. Chronic pain of left knee   2. Muscle weakness (generalized)        Problem List Patient Active Problem List   Diagnosis Date Noted  . Degenerative arthritis of knee, bilateral 09/30/2017  . Chronic pain of both knees 09/18/2017  . Routine general medical examination  at a health care facility 10/17/2014  . Reflux esophagitis 03/01/2013  . Essential hypertension 07/08/2006  . Osteopenia 07/08/2006   Starr Lake PT, DPT, LAT, ATC  09/22/18  10:14 AM      [facility] [address] [place], [place], [address] Phone: [phone]   Fax:  [phone]  Name: DEVIN FOSKEY MRN: [id number] Date of Birth: Mar 16, 1945     PHYSICAL THERAPY DISCHARGE SUMMARY  Visits from Start of Care: 5  Current functional level related to goals / functional outcomes: See goals   Remaining deficits: unknown   Education / Equipment: HEP  Plan: Patient agrees to discharge.  Patient goals were partially met. Patient is being discharged due to not returning since the last visit.  ?????          PT, DPT, LAT, ATC  09/22/18  10:14 AM

## 2018-08-05 ENCOUNTER — Ambulatory Visit
Admission: RE | Admit: 2018-08-05 | Discharge: 2018-08-05 | Disposition: A | Discharge status: Home / Self Care | Payer: Medicare Other | Source: Ambulatory Visit | Attending: Internal Medicine | Admitting: Internal Medicine

## 2018-08-05 ENCOUNTER — Other Ambulatory Visit: Payer: Self-pay

## 2018-08-05 DIAGNOSIS — Z1231 Encounter for screening mammogram for malignant neoplasm of breast: Secondary | ICD-10-CM

## 2018-08-13 ENCOUNTER — Ambulatory Visit: Payer: Medicare Other | Admitting: Physical Therapy

## 2018-08-18 ENCOUNTER — Other Ambulatory Visit (INDEPENDENT_AMBULATORY_CARE_PROVIDER_SITE_OTHER): Payer: Medicare Other

## 2018-08-18 ENCOUNTER — Ambulatory Visit (INDEPENDENT_AMBULATORY_CARE_PROVIDER_SITE_OTHER): Payer: Medicare Other | Admitting: Family Medicine

## 2018-08-18 ENCOUNTER — Encounter: Payer: Self-pay | Admitting: Family Medicine

## 2018-08-18 ENCOUNTER — Other Ambulatory Visit: Payer: Self-pay

## 2018-08-18 ENCOUNTER — Ambulatory Visit: Payer: Medicare Other | Admitting: Physical Therapy

## 2018-08-18 VITALS — BP 124/90 | HR 45 | Ht 62.0 in | Wt 145.0 lb

## 2018-08-18 DIAGNOSIS — M170 Bilateral primary osteoarthritis of knee: Secondary | ICD-10-CM | POA: Diagnosis not present

## 2018-08-18 DIAGNOSIS — E559 Vitamin D deficiency, unspecified: Secondary | ICD-10-CM

## 2018-08-18 DIAGNOSIS — M2550 Pain in unspecified joint: Secondary | ICD-10-CM

## 2018-08-18 DIAGNOSIS — E038 Other specified hypothyroidism: Secondary | ICD-10-CM

## 2018-08-18 LAB — CBC WITH DIFFERENTIAL/PLATELET
Basophils Absolute: 0.1 10*3/uL (ref 0.0–0.1)
Basophils Relative: 1 % (ref 0.0–3.0)
Eosinophils Absolute: 0.1 10*3/uL (ref 0.0–0.7)
Eosinophils Relative: 1.6 % (ref 0.0–5.0)
HCT: 43.5 % (ref 36.0–46.0)
Hemoglobin: 13.9 g/dL (ref 12.0–15.0)
Lymphocytes Relative: 25.1 % (ref 12.0–46.0)
Lymphs Abs: 2.2 10*3/uL (ref 0.7–4.0)
MCHC: 32 g/dL (ref 30.0–36.0)
MCV: 83.3 fl (ref 78.0–100.0)
Monocytes Absolute: 0.7 10*3/uL (ref 0.1–1.0)
Monocytes Relative: 7.8 % (ref 3.0–12.0)
Neutro Abs: 5.7 10*3/uL (ref 1.4–7.7)
Neutrophils Relative %: 64.5 % (ref 43.0–77.0)
Platelets: 313 10*3/uL (ref 150.0–400.0)
RBC: 5.22 Mil/uL — ABNORMAL HIGH (ref 3.87–5.11)
RDW: 14.7 % (ref 11.5–15.5)
WBC: 8.9 10*3/uL (ref 4.0–10.5)

## 2018-08-18 LAB — SEDIMENTATION RATE: Sed Rate: 18 mm/hr (ref 0–30)

## 2018-08-18 LAB — COMPREHENSIVE METABOLIC PANEL
ALT: 23 U/L (ref 0–35)
AST: 20 U/L (ref 0–37)
Albumin: 4.1 g/dL (ref 3.5–5.2)
Alkaline Phosphatase: 77 U/L (ref 39–117)
BUN: 27 mg/dL — ABNORMAL HIGH (ref 6–23)
CO2: 30 mEq/L (ref 19–32)
Calcium: 9 mg/dL (ref 8.4–10.5)
Chloride: 103 mEq/L (ref 96–112)
Creatinine, Ser: 0.98 mg/dL (ref 0.40–1.20)
GFR: 67.75 mL/min (ref >60.00)
Glucose, Bld: 65 mg/dL — ABNORMAL LOW (ref 70–99)
Potassium: 3.8 mEq/L (ref 3.5–5.1)
Sodium: 141 mEq/L (ref 135–145)
Total Bilirubin: 0.6 mg/dL (ref 0.2–1.2)
Total Protein: 7 g/dL (ref 6.0–8.3)

## 2018-08-18 LAB — URIC ACID: Uric Acid, Serum: 5.9 mg/dL (ref 2.4–7.0)

## 2018-08-18 LAB — IBC PANEL
Iron: 89 ug/dL (ref 42–145)
Saturation Ratios: 25.7 % (ref 20.0–50.0)
Transferrin: 247 mg/dL (ref 212.0–360.0)

## 2018-08-18 LAB — TSH: TSH: 1.59 u[IU]/mL (ref 0.35–4.50)

## 2018-08-18 LAB — C-REACTIVE PROTEIN: CRP: <1 mg/dL (ref 0.5–20.0)

## 2018-08-18 LAB — FERRITIN: Ferritin: 219 ng/mL (ref 10.0–291.0)

## 2018-08-18 NOTE — Patient Instructions (Addendum)
Good to see you Labs downstairs See you again in 10 weeks

## 2018-08-18 NOTE — Assessment & Plan Note (Signed)
Repeat injections given today.  We discussed the possibility of PRP with patient failing all viscosupplementation previously.  Patient does not want any surgical intervention.  Has not been wearing the brace regularly.  Laboratory work-up to rule out such things as uric acid elevation that could be contributing to some of the aches and pains as well as the severity of the arthritis.  Follow-up with me again in 10 weeks otherwise.

## 2018-08-18 NOTE — Progress Notes (Signed)
[facility] [place], [place] [address] Phone: [phone] Subjective:   I Kayla Key am serving as a Education administrator for Dr. Hulan Saas.     CC: Bilateral knee pain  SEG:[id number]   06/23/2018: Steroid injection given again today.  Hopefully this will be relatively well.  We discussed with patient about PRP.  Start formal physical therapy, meloxicam given for breakthrough pain.  Discussed icing regimen and home exercise.  Follow-up again in 4 to 6 weeks.  Update 08/18/2018: Kayla Key is a 74 y.o. female coming in with complaint of bilateral knee pain. Patient states having worsening pain.  Patient notes that wakes her up at night.  Affecting daily activities such as going up or down stairs.  Patient states some mild increasing instability.        Past Medical History:  Diagnosis Date  . Arthritis   . Asthma   . GERD (gastroesophageal reflux disease)   . Hypertension   . MHA (microangiopathic hemolytic anemia) (HCC)   . Osteoporosis    Past Surgical History:  Procedure Laterality Date  . BUNIONECTOMY Right   . KNEE ARTHROSCOPY Right 2009  . KNEE ARTHROSCOPY Left 2015  . TOE SURGERY Bilateral 2005  . TUBAL LIGATION  1988   Social History   Socioeconomic History  . Marital status: Widowed    Spouse name: Not on file  . Number of children: Not on file  . Years of education: Not on file  . Highest education level: Not on file  Occupational History  . Not on file  Social Needs  . Financial resource strain: Not hard at all  . Food insecurity    Worry: Never true    Inability: Never true  . Transportation needs    Medical: No    Non-medical: No  Tobacco Use  . Smoking status: Never Smoker  . Smokeless tobacco: Never Used  Substance and Sexual Activity  . Alcohol use: No    Alcohol/week: 0.0 standard drinks  . Drug use: No  . Sexual activity: Not Currently  Lifestyle  . Physical activity    Days per week: 5 days    Minutes per session: 40 min  . Stress: Only a little  Relationships  . Social connections    Talks on phone: More than three times a week    Gets together: More than three times a week    Attends religious service: Not on file    Active member of club or organization: Not on file    Attends meetings of clubs or organizations: Not on file    Relationship status: Widowed  Other Topics Concern  . Not on file  Social History Narrative  . Not on file   No Known Allergies Family History  Problem Relation Age of Onset  . Stroke Father   . Hypertension Sister   . Breast cancer Sister 58  . Cancer Other        breast   . Breast cancer Maternal Aunt 85  . Breast cancer Cousin 90  . Colon cancer Neg Hx      Current Outpatient Medications (Cardiovascular):  .  hydrochlorothiazide (HYDRODIURIL) 25 MG tablet, Take 1 tablet (25 mg total) by mouth daily. Overdue for annual appt must see provider for future refills .  nadolol (CORGARD) 20 MG tablet, TAKE 1 TABLET BY MOUTH EVERY DAY NEED   Current Outpatient Medications (Analgesics):  .  meloxicam (MOBIC) 15 MG tablet, Take  1 tablet (15 mg total) by mouth daily.   Current Outpatient Medications (Other):  Marland Kitchen.  Multiple Vitamin (MULTI-VITAMIN DAILY PO), Take 1 tablet by mouth daily. Marland Kitchen.  omeprazole (PRILOSEC) 20 MG capsule, TAKE 1 CAPSULE (20 MG TOTAL) BY MOUTH DAILY.    Past medical history, social, surgical and family history all reviewed in electronic medical record.  No pertanent information unless stated regarding to the chief complaint.   Review of Systems:  No headache, visual changes, nausea, vomiting, diarrhea, constipation, dizziness, abdominal pain, skin rash, fevers, chills, night sweats, weight loss, swollen lymph nodes, body aches, joint swelling, chest pain, shortness of breath, mood changes.  Positive muscle aches  Objective  Blood pressure 124/90, pulse (!) 45, height 5\' 2"  (1.575 m), weight 145 lb (65.8 kg), SpO2 96 %.     General: No apparent distress alert and oriented x3 mood and affect normal, dressed appropriately.  HEENT: Pupils equal, extraocular movements intact  Respiratory: Patient's speak in full sentences and does not appear short of breath  Cardiovascular: No lower extremity edema, non tender, no erythema  Skin: Warm dry intact with no signs of infection or rash on extremities or on axial skeleton.  Abdomen: Soft nontender  Neuro: Cranial nerves II through XII are intact, neurovascularly intact in all extremities with 2+ DTRs and 2+ pulses.  Lymph: No lymphadenopathy of posterior or anterior cervical chain or axillae bilaterally.  Gait normal with good balance and coordination.  MSK:  Non tender with full range of motion and good stability and symmetric strength and tone of shoulders, elbows, wrist, hip, knee and ankles bilaterally.  Knee: Lateral valgus deformity noted.  Abnormal thigh to calf ratio.  Tender to palpation over medial joint line ROM full in flexion and extension and lower leg rotation. instability with valgus force.  painful patellar compression. Patellar glide with severe crepitus. Patellar and quadriceps tendons unremarkable. Hamstring and quadriceps strength is normal.   After informed written and verbal consent, patient was seated on exam table. Right knee was prepped with alcohol swab and utilizing anterolateral approach, patient's right knee space was injected with 4:1  marcaine 0.5%: Kenalog 40mg /dL. Patient tolerated the procedure well without immediate complications.  After informed written and verbal consent, patient was seated on exam table. Left knee was prepped with alcohol swab and utilizing anterolateral approach, patient's left knee space was injected with 4:1  marcaine 0.5%: Kenalog 40mg /dL. Patient tolerated the procedure well without immediate complications.   Impression and Recommendations:     This case required medical decision making of moderate  complexity. The above documentation has been reviewed and is accurate and complete Judi SaaZachary M Alvaretta Eisenberger, DO       Note: This dictation was prepared with Dragon dictation along with smaller phrase technology. Any transcriptional errors that result from this process are unintentional.

## 2018-08-23 LAB — ANA: Anti Nuclear Antibody (ANA): NEGATIVE

## 2018-08-23 LAB — PTH, INTACT AND CALCIUM
Calcium: 9.5 mg/dL (ref 8.6–10.4)
PTH: 29 pg/mL (ref 14–64)

## 2018-08-23 LAB — ANGIOTENSIN CONVERTING ENZYME: Angiotensin-Converting Enzyme: 15 U/L (ref 9–67)

## 2018-08-23 LAB — VITAMIN D 1,25 DIHYDROXY
Vitamin D 1, 25 (OH)2 Total: 46 pg/mL (ref 18–72)
Vitamin D2 1, 25 (OH)2: <8 pg/mL
Vitamin D3 1, 25 (OH)2: 46 pg/mL

## 2018-08-23 LAB — CALCIUM, IONIZED: Calcium, Ion: 5.17 mg/dL (ref 4.8–5.6)

## 2018-08-23 LAB — RHEUMATOID FACTOR: Rheumatoid fact SerPl-aCnc: <14 IU/mL (ref <14)

## 2018-08-23 LAB — CYCLIC CITRUL PEPTIDE ANTIBODY, IGG: Cyclic Citrullin Peptide Ab: <16 UNITS

## 2018-10-18 NOTE — Progress Notes (Signed)
[facility] [place], [place] [address] Phone: [phone] Subjective:   I Kayla Key am serving as a Education administrator for Dr. Hulan Saas.    CC: Knee pain follow-up  [id number]   08/18/2018 Repeat injections given today.  We discussed the possibility of PRP with patient failing all viscosupplementation previously.  Patient does not want any surgical intervention.  Has not been wearing the brace regularly.  Laboratory work-up to rule out such things as uric acid elevation that could be contributing to some of the aches and pains as well as the severity of the arthritis.  Follow-up with me again in 10 weeks otherwise.  Update 10/19/2018 Kayla Key is a 73 y.o. female coming in with complaint of bilateral knee pain. States she has been doing well. Feel directly on the knees about 3 weeks ago.  Patient is having worsening pain again.  Affecting daily activities.  Patient has known arthritic changes.  Has done formal physical therapy in the past.    Patient is seen 2 months ago and given bilateral steroid injections.  Has done formal physical therapy.  Past Medical History:  Diagnosis Date  . Arthritis   . Asthma   . GERD (gastroesophageal reflux disease)   . Hypertension   . MHA (microangiopathic hemolytic anemia) (HCC)   . Osteoporosis    Past Surgical History:  Procedure Laterality Date  . BUNIONECTOMY Right   . KNEE ARTHROSCOPY Right 2009  . KNEE ARTHROSCOPY Left 2015  . TOE SURGERY Bilateral 2005  . TUBAL LIGATION  1988   Social History   Socioeconomic History  . Marital status: Widowed    Spouse name: Not on file  . Number of children: Not on file  . Years of education: Not on file  . Highest education level: Not on file  Occupational History  . Not on file  Social Needs  . Financial resource strain: Not hard at all  . Food insecurity    Worry: Never true    Inability: Never true  . Transportation needs    Medical:  No    Non-medical: No  Tobacco Use  . Smoking status: Never Smoker  . Smokeless tobacco: Never Used  Substance and Sexual Activity  . Alcohol use: No    Alcohol/week: 0.0 standard drinks  . Drug use: No  . Sexual activity: Not Currently  Lifestyle  . Physical activity    Days per week: 5 days    Minutes per session: 40 min  . Stress: Only a little  Relationships  . Social connections    Talks on phone: More than three times a week    Gets together: More than three times a week    Attends religious service: Not on file    Active member of club or organization: Not on file    Attends meetings of clubs or organizations: Not on file    Relationship status: Widowed  Other Topics Concern  . Not on file  Social History Narrative  . Not on file   No Known Allergies Family History  Problem Relation Age of Onset  . Stroke Father   . Hypertension Sister   . Breast cancer Sister 71  . Cancer Other        breast   . Breast cancer Maternal Aunt 85  . Breast cancer Cousin 53  . Colon cancer Neg Hx      Current Outpatient Medications (Cardiovascular):  .  hydrochlorothiazide (HYDRODIURIL)  25 MG tablet, Take 1 tablet (25 mg total) by mouth daily. Overdue for annual appt must see provider for future refills .  nadolol (CORGARD) 20 MG tablet, TAKE 1 TABLET BY MOUTH EVERY DAY NEED   Current Outpatient Medications (Analgesics):  .  meloxicam (MOBIC) 15 MG tablet, Take 1 tablet (15 mg total) by mouth daily.   Current Outpatient Medications (Other):  Marland Kitchen.  Multiple Vitamin (MULTI-VITAMIN DAILY PO), Take 1 tablet by mouth daily. Marland Kitchen.  omeprazole (PRILOSEC) 20 MG capsule, TAKE 1 CAPSULE (20 MG TOTAL) BY MOUTH DAILY.    Past medical history, social, surgical and family history all reviewed in electronic medical record.  No pertanent information unless stated regarding to the chief complaint.   Review of Systems:  No headache, visual changes, nausea, vomiting, diarrhea, constipation,  dizziness, abdominal pain, skin rash, fevers, chills, night sweats, weight loss, swollen lymph nodes, body aches, joint swelling,  chest pain, shortness of breath, mood changes.  Positive muscle aches  Objective  Blood pressure 130/90, pulse 63, height 5\' 2"  (1.575 m), weight 144 lb (65.3 kg), SpO2 98 %.    General: No apparent distress alert and oriented x3 mood and affect normal, dressed appropriately.  HEENT: Pupils equal, extraocular movements intact  Respiratory: Patient's speak in full sentences and does not appear short of breath  Cardiovascular: No lower extremity edema, non tender, no erythema  Skin: Warm dry intact with no signs of infection or rash on extremities or on axial skeleton.  Abdomen: Soft nontender  Neuro: Cranial nerves II through XII are intact, neurovascularly intact in all extremities with 2+ DTRs and 2+ pulses.  Lymph: No lymphadenopathy of posterior or anterior cervical chain or axillae bilaterally.  Gait mild antalgic MSK:  tender with full range of motion and good stability and symmetric strength and tone of shoulders, elbows, wrist, hip and ankles bilaterally.  Knee: Bilateral valgus deformity noted.  Abnormal thigh to calf ratio.  Tender to palpation over medial and PF joint line.  ROM full in flexion and extension and lower leg rotation. instability with valgus force.  painful patellar compression. Patellar glide with moderate crepitus. Patellar and quadriceps tendons unremarkable. Hamstring and quadriceps strength is normal.   After informed written and verbal consent, patient was seated on exam table. Right knee was prepped with alcohol swab and utilizing anterolateral approach, patient's right knee space was injected with 4:1  marcaine 0.5%: Kenalog 40mg /dL. Patient tolerated the procedure well without immediate complications.  After informed written and verbal consent, patient was seated on exam table. Left knee was prepped with alcohol swab and  utilizing anterolateral approach, patient's left knee space was injected with 4:1  marcaine 0.5%: Kenalog 40mg /dL. Patient tolerated the procedure well without immediate complications.    Impression and Recommendations:     This case required medical decision making of moderate complexity. The above documentation has been reviewed and is accurate and complete Judi SaaZachary M , DO       Note: This dictation was prepared with Dragon dictation along with smaller phrase technology. Any transcriptional errors that result from this process are unintentional.

## 2018-10-19 ENCOUNTER — Encounter: Payer: Self-pay | Admitting: Family Medicine

## 2018-10-19 ENCOUNTER — Other Ambulatory Visit: Payer: Self-pay

## 2018-10-19 ENCOUNTER — Ambulatory Visit (INDEPENDENT_AMBULATORY_CARE_PROVIDER_SITE_OTHER): Payer: Medicare Other | Admitting: Family Medicine

## 2018-10-19 DIAGNOSIS — M170 Bilateral primary osteoarthritis of knee: Secondary | ICD-10-CM | POA: Diagnosis not present

## 2018-10-19 NOTE — Assessment & Plan Note (Signed)
Bilateral injections given today.  Did not respond extremely well to Visco supplementation.  Patient will consider the PRP.  Continue to stay active.  Discussed icing regimen, discussed over-the-counter medications that could be beneficial.  Follow-up again in 4 to 8 weeks

## 2018-10-19 NOTE — Patient Instructions (Addendum)
Good to see you Iron 65 mg with vitamin C 500 mg at least 3 times a week See me again in 10 weeks

## 2018-10-27 ENCOUNTER — Ambulatory Visit: Payer: Medicare Other | Admitting: Family Medicine

## 2018-11-02 ENCOUNTER — Encounter: Payer: Self-pay | Admitting: Internal Medicine

## 2018-11-02 ENCOUNTER — Ambulatory Visit (INDEPENDENT_AMBULATORY_CARE_PROVIDER_SITE_OTHER): Payer: Medicare Other | Admitting: Internal Medicine

## 2018-11-02 ENCOUNTER — Other Ambulatory Visit (INDEPENDENT_AMBULATORY_CARE_PROVIDER_SITE_OTHER): Payer: Medicare Other

## 2018-11-02 ENCOUNTER — Other Ambulatory Visit: Payer: Self-pay

## 2018-11-02 VITALS — BP 118/82 | HR 62 | Temp 98.3°F | Ht 62.0 in | Wt 143.0 lb

## 2018-11-02 DIAGNOSIS — Z0000 Encounter for general adult medical examination without abnormal findings: Secondary | ICD-10-CM | POA: Diagnosis not present

## 2018-11-02 DIAGNOSIS — I10 Essential (primary) hypertension: Secondary | ICD-10-CM

## 2018-11-02 DIAGNOSIS — R7301 Impaired fasting glucose: Secondary | ICD-10-CM

## 2018-11-02 DIAGNOSIS — K2100 Gastro-esophageal reflux disease with esophagitis, without bleeding: Secondary | ICD-10-CM | POA: Diagnosis not present

## 2018-11-02 LAB — LIPID PANEL
Cholesterol: 219 mg/dL — ABNORMAL HIGH (ref 0–200)
HDL: 76.1 mg/dL (ref >39.00)
LDL Cholesterol: 131 mg/dL — ABNORMAL HIGH (ref 0–99)
NonHDL: 143.04
Total CHOL/HDL Ratio: 3
Triglycerides: 62 mg/dL (ref 0.0–149.0)
VLDL: 12.4 mg/dL (ref 0.0–40.0)

## 2018-11-02 LAB — HEMOGLOBIN A1C: Hgb A1c MFr Bld: 6.6 % — ABNORMAL HIGH (ref 4.6–6.5)

## 2018-11-02 MED ORDER — ZOSTER VAC RECOMB ADJUVANTED 50 MCG/0.5ML IM SUSR: 0.5000 mL | Freq: Once | INTRAMUSCULAR | 1 refills | Status: AC

## 2018-11-02 NOTE — Patient Instructions (Signed)
Health Maintenance, Female Adopting a healthy lifestyle and getting preventive care are important in promoting health and wellness. Ask your health care provider about:  The right schedule for you to have regular tests and exams.  Things you can do on your own to prevent diseases and keep yourself healthy. What should I know about diet, weight, and exercise? Eat a healthy diet   Eat a diet that includes plenty of vegetables, fruits, low-fat dairy products, and lean protein.  Do not eat a lot of foods that are high in solid fats, added sugars, or sodium. Maintain a healthy weight Body mass index (BMI) is used to identify weight problems. It estimates body fat based on height and weight. Your health care provider can help determine your BMI and help you achieve or maintain a healthy weight. Get regular exercise Get regular exercise. This is one of the most important things you can do for your health. Most adults should:  Exercise for at least 150 minutes each week. The exercise should increase your heart rate and make you sweat (moderate-intensity exercise).  Do strengthening exercises at least twice a week. This is in addition to the moderate-intensity exercise.  Spend less time sitting. Even light physical activity can be beneficial. Watch cholesterol and blood lipids Have your blood tested for lipids and cholesterol at 71 years of age, then have this test every 5 years. Have your cholesterol levels checked more often if:  Your lipid or cholesterol levels are high.  You are older than 71 years of age.  You are at high risk for heart disease. What should I know about cancer screening? Depending on your health history and family history, you may need to have cancer screening at various ages. This may include screening for:  Breast cancer.  Cervical cancer.  Colorectal cancer.  Skin cancer.  Lung cancer. What should I know about heart disease, diabetes, and high blood  pressure? Blood pressure and heart disease  High blood pressure causes heart disease and increases the risk of stroke. This is more likely to develop in people who have high blood pressure readings, are of African descent, or are overweight.  Have your blood pressure checked: ? Every 3-5 years if you are 18-39 years of age. ? Every year if you are 40 years old or older. Diabetes Have regular diabetes screenings. This checks your fasting blood sugar level. Have the screening done:  Once every three years after age 40 if you are at a normal weight and have a low risk for diabetes.  More often and at a younger age if you are overweight or have a high risk for diabetes. What should I know about preventing infection? Hepatitis B If you have a higher risk for hepatitis B, you should be screened for this virus. Talk with your health care provider to find out if you are at risk for hepatitis B infection. Hepatitis C Testing is recommended for:  Everyone born from 1945 through 1965.  Anyone with known risk factors for hepatitis C. Sexually transmitted infections (STIs)  Get screened for STIs, including gonorrhea and chlamydia, if: ? You are sexually active and are younger than 71 years of age. ? You are older than 71 years of age and your health care provider tells you that you are at risk for this type of infection. ? Your sexual activity has changed since you were last screened, and you are at increased risk for chlamydia or gonorrhea. Ask your health care provider if   you are at risk.  Ask your health care provider about whether you are at high risk for HIV. Your health care provider may recommend a prescription medicine to help prevent HIV infection. If you choose to take medicine to prevent HIV, you should first get tested for HIV. You should then be tested every 3 months for as long as you are taking the medicine. Pregnancy  If you are about to stop having your period (premenopausal) and  you may become pregnant, seek counseling before you get pregnant.  Take 400 to 800 micrograms (mcg) of folic acid every day if you become pregnant.  Ask for birth control (contraception) if you want to prevent pregnancy. Osteoporosis and menopause Osteoporosis is a disease in which the bones lose minerals and strength with aging. This can result in bone fractures. If you are 65 years old or older, or if you are at risk for osteoporosis and fractures, ask your health care provider if you should:  Be screened for bone loss.  Take a calcium or vitamin D supplement to lower your risk of fractures.  Be given hormone replacement therapy (HRT) to treat symptoms of menopause. Follow these instructions at home: Lifestyle  Do not use any products that contain nicotine or tobacco, such as cigarettes, e-cigarettes, and chewing tobacco. If you need help quitting, ask your health care provider.  Do not use street drugs.  Do not share needles.  Ask your health care provider for help if you need support or information about quitting drugs. Alcohol use  Do not drink alcohol if: ? Your health care provider tells you not to drink. ? You are pregnant, may be pregnant, or are planning to become pregnant.  If you drink alcohol: ? Limit how much you use to 0-1 drink a day. ? Limit intake if you are breastfeeding.  Be aware of how much alcohol is in your drink. In the [place] one drink equals one 12 oz bottle of beer (355 mL), one 5 oz glass of wine (148 mL), or one 1 oz glass of hard liquor (44 mL). General instructions  Schedule regular health, dental, and eye exams.  Stay current with your vaccines.  Tell your health care provider if: ? You often feel depressed. ? You have ever been abused or do not feel safe at home. Summary  Adopting a healthy lifestyle and getting preventive care are important in promoting health and wellness.  Follow your health care provider's instructions about healthy  diet, exercising, and getting tested or screened for diseases.  Follow your health care provider's instructions on monitoring your cholesterol and blood pressure. This information is not intended to replace advice given to you by your health care provider. Make sure you discuss any questions you have with your health care provider. Document Released: 07/30/2010 Document Revised: 01/07/2018 Document Reviewed: 01/07/2018 Elsevier Patient Education  2020 [employer]  

## 2018-11-02 NOTE — Progress Notes (Signed)
Subjective:   Patient ID: Kayla Key, female    DOB: 1945-07-09, 73 y.o.   MRN: [id number]  HPI Here for medicare wellness, no new complaints. Please see A/P for status and treatment of chronic medical problems.   HPI #2: Here for follow up of impaired sugars (prior HgA1c 6.3, denies change in diet, not exercising much, denies numbness in feet, denies being very thirst or urinating more often) and blood pressure (taking nadolol and hctz, denies side effects, denies lightheadedness or chest pains or headaches) and GERD (taking omeprazole, denies breakthrough symptoms, denies blood in stool or abdominal pain).   Diet: heart healthy Physical activity: sedentary Depression/mood screen: negative Hearing: intact to whispered voice Visual acuity: grossly normal, performs annual eye exam  ADLs: capable Fall risk: low Home safety: good Cognitive evaluation: intact to orientation, naming, recall and repetition EOL planning: adv directives discussed    Office Visit from 11/02/2018 in [facility]  PHQ-2 Total Score  0      I have personally reviewed and have noted 1. The patient's medical and social history - reviewed today no changes 2. Their use of alcohol, tobacco or illicit drugs 3. Their current medications and supplements 4. The patient's functional ability including ADL's, fall risks, home safety risks and hearing or visual impairment. 5. Diet and physical activities 6. Evidence for depression or mood disorders 7. Care team reviewed and updated  Patient Care Team: Hoyt Koch, MD as PCP - General (Internal Medicine) Past Medical History:  Diagnosis Date  . Arthritis   . Asthma   . GERD (gastroesophageal reflux disease)   . Hypertension   . MHA (microangiopathic hemolytic anemia) (HCC)   . Osteoporosis    Past Surgical History:  Procedure Laterality Date  . BUNIONECTOMY Right   . KNEE ARTHROSCOPY Right 2009  . KNEE ARTHROSCOPY Left  2015  . TOE SURGERY Bilateral 2005  . TUBAL LIGATION  1988   Family History  Problem Relation Age of Onset  . Stroke Father   . Hypertension Sister   . Breast cancer Sister 80  . Cancer Other        breast   . Breast cancer Maternal Aunt 85  . Breast cancer Cousin 69  . Colon cancer Neg Hx     Review of Systems  Constitutional: Negative.   HENT: Negative.   Eyes: Negative.   Respiratory: Negative for cough, chest tightness and shortness of breath.   Cardiovascular: Negative for chest pain, palpitations and leg swelling.  Gastrointestinal: Negative for abdominal distention, abdominal pain, constipation, diarrhea, nausea and vomiting.  Musculoskeletal: Negative.   Skin: Negative.   Neurological: Negative.   Psychiatric/Behavioral: Negative.     Objective:  Physical Exam Constitutional:      Appearance: She is well-developed.  HENT:     Head: Normocephalic and atraumatic.  Neck:     Musculoskeletal: Normal range of motion.  Cardiovascular:     Rate and Rhythm: Normal rate and regular rhythm.  Pulmonary:     Effort: Pulmonary effort is normal. No respiratory distress.     Breath sounds: Normal breath sounds. No wheezing or rales.  Abdominal:     General: Bowel sounds are normal. There is no distension.     Palpations: Abdomen is soft.     Tenderness: There is no abdominal tenderness. There is no rebound.  Skin:    General: Skin is warm and dry.  Neurological:     Mental Status: She is  alert and oriented to person, place, and time.     Coordination: Coordination normal.     Vitals:   11/02/18 1427  BP: 118/82  Pulse: 62  Temp: 98.3 F (36.8 C)  TempSrc: Oral  SpO2: 99%  Weight: 143 lb (64.9 kg)  Height: 5\' 2"  (1.575 m)    Assessment & Plan:

## 2018-11-03 DIAGNOSIS — R7303 Prediabetes: Secondary | ICD-10-CM | POA: Insufficient documentation

## 2018-11-03 DIAGNOSIS — R7301 Impaired fasting glucose: Secondary | ICD-10-CM | POA: Insufficient documentation

## 2018-11-03 NOTE — Assessment & Plan Note (Signed)
Taking omeprazole which is keeping symptoms controlled. Counseled about risk and benefit and she wishes to continue with therapy.

## 2018-11-03 NOTE — Assessment & Plan Note (Signed)
Checking HgA1c and adjust as needed.  

## 2018-11-03 NOTE — Assessment & Plan Note (Signed)
BP at goal on nadolol and hctz. Checking CMP and adjust as needed.

## 2018-11-03 NOTE — Assessment & Plan Note (Signed)
Flu shot complete for season. Pneumonia complete. Shingrix given rx. Tetanus due, declines. Colonoscopy due 2026. Mammogram due 2022, pap smear aged out and dexa due 2020 and she defers to next year with pandemic. Counseled about sun safety and mole surveillance. Counseled about the dangers of distracted driving. Given 10 year screening recommendations.

## 2018-12-03 ENCOUNTER — Telehealth: Payer: Self-pay | Admitting: Internal Medicine

## 2018-12-03 NOTE — Telephone Encounter (Signed)
Noted  

## 2018-12-03 NOTE — Telephone Encounter (Signed)
Notes recorded by Pollyann Glen, CMA on 11/03/2018 at 4:14 PM EDT  LVM informing patient of results and to clal back and let us know if willing to start a cholesterol medication and to schedule a 3 month follow up to re-check sugars   Pt states that she does want to be contacted about stating this medication and also is making a 3 mo FU today. FU with pt about med. [phone]

## 2019-02-18 ENCOUNTER — Ambulatory Visit: Payer: Medicare Other | Admitting: Internal Medicine

## 2019-02-22 ENCOUNTER — Other Ambulatory Visit: Payer: Self-pay

## 2019-02-22 ENCOUNTER — Ambulatory Visit: Payer: Medicare HMO | Admitting: Family Medicine

## 2019-02-22 ENCOUNTER — Encounter: Payer: Self-pay | Admitting: Family Medicine

## 2019-02-22 DIAGNOSIS — M170 Bilateral primary osteoarthritis of knee: Secondary | ICD-10-CM

## 2019-02-22 NOTE — Patient Instructions (Signed)
Injected knees today Read about PRP See me again in 5-6 weeks if you want to do PRP

## 2019-02-22 NOTE — Assessment & Plan Note (Addendum)
Patient given injection bilaterally.  Discussed icing regimen and home exercises, we discussed the possibility of PRP with patient not responding well to the viscosupplementation.  Patient will follow up with me again in 4 to 6 weeks wants to continue that.  Topical anti-inflammatories given.  This is an exacerbation of her chronic illness.  Also progression of the underlying arthritis.

## 2019-02-22 NOTE — Progress Notes (Signed)
[name] [address] [place] [address] Phone: [phone] Subjective:   [name], am serving as a scribe for Dr. Antoine Primas. This visit occurred during the SARS-CoV-2 public health emergency.  Safety protocols were in place, including screening questions prior to the visit, additional usage of staff PPE, and extensive cleaning of exam room while observing appropriate contact time as indicated for disinfecting solutions.   I'm seeing this patient by the request  of:  Myrlene Broker, MD  CC: bilateral knee pain   [id number]   10/19/2018 Bilateral injections given today.  Did not respond extremely well to Visco supplementation.  Patient will consider the PRP.  Continue to stay active.  Discussed icing regimen, discussed over-the-counter medications that could be beneficial.  Follow-up again in 4 to 8 weeks  Update 02/22/2019 Kayla Key is a 74 y.o. female coming in with complaint of bilateral knee pain. Patient states that she has been having bilateral knee pain. States that she fell on both knees before her last visit. Left knee pain great than right. Pain throughout joint. Only had 2 days of relief from last injections.      last injection 10/19/2018  Past Medical History:  Diagnosis Date  . Arthritis   . Asthma   . GERD (gastroesophageal reflux disease)   . Hypertension   . MHA (microangiopathic hemolytic anemia) (HCC)   . Osteoporosis    Past Surgical History:  Procedure Laterality Date  . BUNIONECTOMY Right   . KNEE ARTHROSCOPY Right 2009  . KNEE ARTHROSCOPY Left 2015  . TOE SURGERY Bilateral 2005  . TUBAL LIGATION  1988   Social History   Socioeconomic History  . Marital status: Widowed    Spouse name: Not on file  . Number of children: Not on file  . Years of education: Not on file  . Highest education level: Not on file  Occupational History  . Not on file  Tobacco Use  . Smoking status: Never  Smoker  . Smokeless tobacco: Never Used  Substance and Sexual Activity  . Alcohol use: No    Alcohol/week: 0.0 standard drinks  . Drug use: No  . Sexual activity: Not Currently  Other Topics Concern  . Not on file  Social History Narrative  . Not on file   Social Determinants of Health   Financial Resource Strain:   . Difficulty of Paying Living Expenses: Not on file  Food Insecurity:   . Worried About Programme researcher, broadcasting/film/video in the Last Year: Not on file  . Ran Out of Food in the Last Year: Not on file  Transportation Needs:   . Lack of Transportation (Medical): Not on file  . Lack of Transportation (Non-Medical): Not on file  Physical Activity:   . Days of Exercise per Week: Not on file  . Minutes of Exercise per Session: Not on file  Stress:   . Feeling of Stress : Not on file  Social Connections:   . Frequency of Communication with Friends and Family: Not on file  . Frequency of Social Gatherings with Friends and Family: Not on file  . Attends Religious Services: Not on file  . Active Member of Clubs or Organizations: Not on file  . Attends Banker Meetings: Not on file  . Marital Status: Not on file   No Known Allergies Family History  Problem Relation Age of Onset  . Stroke Father   . Hypertension Sister   .  Breast cancer Sister 17  . Cancer Other        breast   . Breast cancer Maternal Aunt 85  . Breast cancer Cousin 28  . Colon cancer Neg Hx      Current Outpatient Medications (Cardiovascular):  .  hydrochlorothiazide (HYDRODIURIL) 25 MG tablet, Take 1 tablet (25 mg total) by mouth daily. Overdue for annual appt must see provider for future refills .  nadolol (CORGARD) 20 MG tablet, TAKE 1 TABLET BY MOUTH EVERY DAY NEED   Current Outpatient Medications (Analgesics):  .  meloxicam (MOBIC) 15 MG tablet, Take 1 tablet (15 mg total) by mouth daily.   Current Outpatient Medications (Other):  Marland Kitchen  Multiple Vitamin (MULTI-VITAMIN DAILY PO), Take 1  tablet by mouth daily. Marland Kitchen  omeprazole (PRILOSEC) 20 MG capsule, TAKE 1 CAPSULE (20 MG TOTAL) BY MOUTH DAILY.    Past medical history, social, surgical and family history all reviewed in electronic medical record.  No pertanent information unless stated regarding to the chief complaint.   Review of Systems:  No headache, visual changes, nausea, vomiting, diarrhea, constipation, dizziness, abdominal pain, skin rash, fevers, chills, night sweats, weight loss, swollen lymph nodes, body aches, joint swelling, chest pain, shortness of breath, mood changes. POSITIVE muscle aches  Objective  Blood pressure 118/80, pulse (!) 42, height 5\' 2"  (1.575 m), weight 143 lb (64.9 kg), SpO2 99 %.   General: No apparent distress alert and oriented x3 mood and affect normal, dressed appropriately.  HEENT: Pupils equal, extraocular movements intact  Respiratory: Patient's speak in full sentences and does not appear short of breath  Cardiovascular: No lower extremity edema, non tender, no erythema  Skin: Warm dry intact with no signs of infection or rash on extremities or on axial skeleton.  Abdomen: Soft nontender  Neuro: Cranial nerves II through XII are intact, neurovascularly intact in all extremities with 2+ DTRs and 2+ pulses.  Lymph: No lymphadenopathy of posterior or anterior cervical chain or axillae bilaterally.  Gait antalgic gait  MSK:  tender with full range of motion and good stability and symmetric strength and tone of shoulders, elbows, wrist, hip and ankles bilaterally.  Knee: bilateral  valgus deformity noted.  Abnormal thigh to calf ratio.  Tender to palpation over medial and PF joint line.  ROM full in flexion and extension and lower leg rotation. instability with valgus force.  painful patellar compression. Patellar glide with moderate crepitus. Patellar and quadriceps tendons unremarkable. Hamstring and quadriceps strength is normal.  After informed written and verbal consent, patient  was seated on exam table. Right knee was prepped with alcohol swab and utilizing anterolateral approach, patient's right knee space was injected with 4:1  marcaine 0.5%: Kenalog 40mg /dL. Patient tolerated the procedure well without immediate complications.  After informed written and verbal consent, patient was seated on exam table. Left knee was prepped with alcohol swab and utilizing anterolateral approach, patient's left knee space was injected with 4:1  marcaine 0.5%: Kenalog 40mg /dL. Patient tolerated the procedure well without immediate complications.   Diagnosis and treatment significantly limited by social determinants including financial and transportation.   Impression and Recommendations:     This case required medical decision making of moderate complexity. The above documentation has been reviewed and is accurate and complete Lyndal Pulley, DO       Note: This dictation was prepared with Dragon dictation along with smaller phrase technology. Any transcriptional errors that result from this process are unintentional.

## 2019-02-26 ENCOUNTER — Ambulatory Visit: Payer: Self-pay | Admitting: *Deleted

## 2019-02-26 ENCOUNTER — Other Ambulatory Visit: Payer: Medicare HMO

## 2019-02-26 NOTE — Telephone Encounter (Signed)
Summary: covid    Patient is requesting a call back from nurse regarding when she was should have her covid test done.  Call back [phone]      Patient was exposed to her mother yesterday- she has tested + COVID- patient states her exposure was very limited - but wants COVID test. Test scheduled for next week- 5-6 days after her exposure.  Reason for Disposition . [1] CLOSE CONTACT COVID-19 EXPOSURE within last 14 days AND [2] NO symptoms  Protocols used: CORONAVIRUS (COVID-19) EXPOSURE-A-AH

## 2019-03-02 ENCOUNTER — Ambulatory Visit: Payer: Medicare HMO | Attending: Internal Medicine

## 2019-03-02 ENCOUNTER — Ambulatory Visit: Payer: Medicare HMO | Admitting: Internal Medicine

## 2019-03-02 DIAGNOSIS — Z20822 Contact with and (suspected) exposure to covid-19: Secondary | ICD-10-CM

## 2019-03-03 LAB — NOVEL CORONAVIRUS, NAA: SARS-CoV-2, NAA: NOT DETECTED

## 2019-03-04 ENCOUNTER — Ambulatory Visit: Payer: Medicare HMO | Admitting: Internal Medicine

## 2019-04-02 ENCOUNTER — Other Ambulatory Visit: Payer: Self-pay | Admitting: Internal Medicine

## 2019-04-06 ENCOUNTER — Ambulatory Visit: Payer: Medicare HMO | Admitting: Family Medicine

## 2019-04-06 NOTE — Progress Notes (Deleted)
[name] [address] [place] [address] Phone: [phone] Subjective:    I'm seeing this patient by the request  of:  Myrlene Broker, MD  CC:   [id number]  Kayla Key is a 74 y.o. female coming in with complaint of ***  Onset-  Location Duration-  Character- Aggravating factors- Reliving factors-  Therapies tried-  Severity-     Past Medical History:  Diagnosis Date  . Arthritis   . Asthma   . GERD (gastroesophageal reflux disease)   . Hypertension   . MHA (microangiopathic hemolytic anemia) (HCC)   . Osteoporosis    Past Surgical History:  Procedure Laterality Date  . BUNIONECTOMY Right   . KNEE ARTHROSCOPY Right 2009  . KNEE ARTHROSCOPY Left 2015  . TOE SURGERY Bilateral 2005  . TUBAL LIGATION  1988   Social History   Socioeconomic History  . Marital status: Widowed    Spouse name: Not on file  . Number of children: Not on file  . Years of education: Not on file  . Highest education level: Not on file  Occupational History  . Not on file  Tobacco Use  . Smoking status: Never Smoker  . Smokeless tobacco: Never Used  Substance and Sexual Activity  . Alcohol use: No    Alcohol/week: 0.0 standard drinks  . Drug use: No  . Sexual activity: Not Currently  Other Topics Concern  . Not on file  Social History Narrative  . Not on file   Social Determinants of Health   Financial Resource Strain:   . Difficulty of Paying Living Expenses: Not on file  Food Insecurity:   . Worried About Programme researcher, broadcasting/film/video in the Last Year: Not on file  . Ran Out of Food in the Last Year: Not on file  Transportation Needs:   . Lack of Transportation (Medical): Not on file  . Lack of Transportation (Non-Medical): Not on file  Physical Activity:   . Days of Exercise per Week: Not on file  . Minutes of Exercise per Session: Not on file  Stress:   . Feeling of Stress : Not on file  Social Connections:   .  Frequency of Communication with Friends and Family: Not on file  . Frequency of Social Gatherings with Friends and Family: Not on file  . Attends Religious Services: Not on file  . Active Member of Clubs or Organizations: Not on file  . Attends Banker Meetings: Not on file  . Marital Status: Not on file   No Known Allergies Family History  Problem Relation Age of Onset  . Stroke Father   . Hypertension Sister   . Breast cancer Sister 38  . Cancer Other        breast   . Breast cancer Maternal Aunt 85  . Breast cancer Cousin 40  . Colon cancer Neg Hx      Current Outpatient Medications (Cardiovascular):  .  hydrochlorothiazide (HYDRODIURIL) 25 MG tablet, TAKE 1 TABLET BY MOUTH DAILY. OVERDUE FOR ANNUAL APPT MUST SEE PROVIDER FOR FUTURE REFILLS .  nadolol (CORGARD) 20 MG tablet, TAKE 1 TABLET BY MOUTH EVERY DAY NEED   Current Outpatient Medications (Analgesics):  .  meloxicam (MOBIC) 15 MG tablet, Take 1 tablet (15 mg total) by mouth daily.   Current Outpatient Medications (Other):  Marland Kitchen  Multiple Vitamin (MULTI-VITAMIN DAILY PO), Take 1 tablet by mouth daily. Marland Kitchen  omeprazole (PRILOSEC) 20 MG capsule, TAKE  1 CAPSULE (20 MG TOTAL) BY MOUTH DAILY.   Reviewed prior external information including notes and imaging from  primary care provider As well as notes that were available from care everywhere and other healthcare systems.  Past medical history, social, surgical and family history all reviewed in electronic medical record.  No pertanent information unless stated regarding to the chief complaint.   Review of Systems:  No headache, visual changes, nausea, vomiting, diarrhea, constipation, dizziness, abdominal pain, skin rash, fevers, chills, night sweats, weight loss, swollen lymph nodes, body aches, joint swelling, chest pain, shortness of breath, mood changes. POSITIVE muscle aches  Objective  There were no vitals taken for this visit.   General: No apparent  distress alert and oriented x3 mood and affect normal, dressed appropriately.  HEENT: Pupils equal, extraocular movements intact  Respiratory: Patient's speak in full sentences and does not appear short of breath  Cardiovascular: No lower extremity edema, non tender, no erythema  Skin: Warm dry intact with no signs of infection or rash on extremities or on axial skeleton.  Abdomen: Soft nontender  Neuro: Cranial nerves II through XII are intact, neurovascularly intact in all extremities with 2+ DTRs and 2+ pulses.  Lymph: No lymphadenopathy of posterior or anterior cervical chain or axillae bilaterally.  Gait normal with good balance and coordination.  MSK:  Non tender with full range of motion and good stability and symmetric strength and tone of shoulders, elbows, wrist, hip, knee and ankles bilaterally.     Impression and Recommendations:     This case required medical decision making of moderate complexity. The above documentation has been reviewed and is accurate and complete Lyndal Pulley, DO       Note: This dictation was prepared with Dragon dictation along with smaller phrase technology. Any transcriptional errors that result from this process are unintentional.

## 2019-04-10 ENCOUNTER — Other Ambulatory Visit: Payer: Self-pay | Admitting: Internal Medicine

## 2019-04-16 ENCOUNTER — Encounter: Payer: Self-pay | Admitting: Internal Medicine

## 2019-04-16 ENCOUNTER — Ambulatory Visit (INDEPENDENT_AMBULATORY_CARE_PROVIDER_SITE_OTHER): Payer: Medicare HMO | Admitting: Internal Medicine

## 2019-04-16 ENCOUNTER — Other Ambulatory Visit: Payer: Self-pay

## 2019-04-16 VITALS — BP 130/88 | HR 66 | Temp 98.1°F | Ht 62.0 in | Wt 141.0 lb

## 2019-04-16 DIAGNOSIS — I10 Essential (primary) hypertension: Secondary | ICD-10-CM | POA: Diagnosis not present

## 2019-04-16 DIAGNOSIS — R7301 Impaired fasting glucose: Secondary | ICD-10-CM

## 2019-04-16 LAB — POCT GLYCOSYLATED HEMOGLOBIN (HGB A1C): Hemoglobin A1C: 6.2 % — AB (ref 4.0–5.6)

## 2019-04-16 MED ORDER — LOSARTAN POTASSIUM 50 MG PO TABS: 50.0000 mg | ORAL_TABLET | Freq: Every day | ORAL | 3 refills | Status: DC

## 2019-04-16 NOTE — Progress Notes (Signed)
   Subjective:   Patient ID: Kayla Key, female    DOB: 1945/10/21, 74 y.o.   MRN: [id number]  HPI The patient is a 74 YO female coming in for follow up sugars. Has had pre-diabetes for some time. Last HgA1c in the diabetes range. She has lost some weight due to stress and loss of her mother about 1 week ago. Denies chest pains or SOB. Denies numbness or tingling in feet. Diet is much better now that her mother has passed.   Review of Systems  Constitutional: Negative.   HENT: Negative.   Eyes: Negative.   Respiratory: Negative for cough, chest tightness and shortness of breath.   Cardiovascular: Negative for chest pain, palpitations and leg swelling.  Gastrointestinal: Negative for abdominal distention, abdominal pain, constipation, diarrhea, nausea and vomiting.  Musculoskeletal: Negative.   Skin: Negative.   Neurological: Negative.   Psychiatric/Behavioral: Negative.     Objective:  Physical Exam Constitutional:      Appearance: She is well-developed.  HENT:     Head: Normocephalic and atraumatic.  Cardiovascular:     Rate and Rhythm: Normal rate and regular rhythm.  Pulmonary:     Effort: Pulmonary effort is normal. No respiratory distress.     Breath sounds: Normal breath sounds. No wheezing or rales.  Abdominal:     General: Bowel sounds are normal. There is no distension.     Palpations: Abdomen is soft.     Tenderness: There is no abdominal tenderness. There is no rebound.  Musculoskeletal:     Cervical back: Normal range of motion.  Skin:    General: Skin is warm and dry.  Neurological:     Mental Status: She is alert and oriented to person, place, and time.     Coordination: Coordination normal.     Vitals:   04/16/19 1412 04/16/19 1433  BP: (!) 144/100 130/88  Pulse: 66   Temp: 98.1 F (36.7 C)   TempSrc: Oral   SpO2: 98%   Weight: 141 lb (64 kg)   Height: 5\' 2"  (1.575 m)     This visit occurred during the SARS-CoV-2 public health emergency.   Safety protocols were in place, including screening questions prior to the visit, additional usage of staff PPE, and extensive cleaning of exam room while observing appropriate contact time as indicated for disinfecting solutions.   Assessment & Plan:

## 2019-04-16 NOTE — Patient Instructions (Signed)
We will have you stop the nadolol when you run out. We have sent in losartan to replace this with. Keep taking the hctz.   The sugars are better today.

## 2019-04-16 NOTE — Assessment & Plan Note (Signed)
POC HgA1c done today and 6.2 which is stable from most prior readings. Counseled extensively about pre-diabetes and ways to prevent development of diabetes.

## 2019-04-16 NOTE — Assessment & Plan Note (Signed)
Will change BP meds due to cost. No indication for beta blocker. Stop nadolol and add losartan 50 mg daily. Keep HCTZ 25 mg daily and if controls BP will change to combination pill.

## 2019-04-29 ENCOUNTER — Ambulatory Visit (INDEPENDENT_AMBULATORY_CARE_PROVIDER_SITE_OTHER): Payer: Self-pay | Admitting: Family Medicine

## 2019-04-29 ENCOUNTER — Encounter: Payer: Self-pay | Admitting: Family Medicine

## 2019-04-29 ENCOUNTER — Other Ambulatory Visit: Payer: Self-pay

## 2019-04-29 DIAGNOSIS — M170 Bilateral primary osteoarthritis of knee: Secondary | ICD-10-CM

## 2019-04-29 MED ORDER — DOXYCYCLINE HYCLATE 100 MG PO TABS: 100.0000 mg | ORAL_TABLET | Freq: Two times a day (BID) | ORAL | 0 refills | Status: AC

## 2019-04-29 NOTE — Patient Instructions (Signed)
See me again in 6 weeks 

## 2019-04-29 NOTE — Progress Notes (Signed)
  [name] [address] [place] [address] Phone: [phone] Subjective:   I Kayla Key am serving as a Neurosurgeon for Dr. Antoine Primas.   This visit occurred during the SARS-CoV-2 public health emergency.  Safety protocols were in place, including screening questions prior to the visit, additional usage of staff PPE, and extensive cleaning of exam room while observing appropriate contact time as indicated for disinfecting solutions.   I'm seeing this patient by the request  of:  Myrlene Broker, MD  CC: B knee pain  [id number]   02/22/19: Patient given injection bilaterally.  Discussed icing regimen and home exercises, we discussed the possibility of PRP with patient not responding well to the viscosupplementation.  Patient will follow up with me again in 4 to 6 weeks wants to continue that.  Topical anti-inflammatories given.  This is an exacerbation of her chronic illness.  Also progression of the underlying arthritis.  04/29/19: Kayla Key is a 74 y.o. female coming in with complaint of B knee pain.  She is here today to get PRP injections in her knees.  She was last seen on 02/22/19 and had B knee corticosteroid injections.  She has tried Visco supplementation in the past w/ no long-lasting relief. States she is not feeling well. States she was under a lot of stress last week. PRP today.      Past medical history, social, surgical and family history all reviewed in electronic medical record.  No pertanent information unless stated regarding to the chief complaint.     Objective  Blood pressure 112/78, height 5\' 2"  (1.575 m), weight 141 lb (64 kg).   General: No apparent distress alert and oriented x3 mood and affect normal, dressed appropriately.  Knee: Bilateral valgus deformity noted. Large thigh to calf ratio.  Tender to palpation over medial and PF joint line.  ROM full in flexion and extension and lower leg  rotation. instability with valgus force.  painful patellar compression. Patellar glide with moderate crepitus. Patellar and quadriceps tendons unremarkable. Hamstring and quadriceps strength is normal.    After informed written and verbal consent, patient was seated on exam table. Right knee was prepped with alcohol swab and utilizing anterolateral approach, patient's right knee space was injected with 4cc  marcaine 0.5%: Then 3 cc of 3 centrifuge PRP used. Patient tolerated the procedure well without immediate complications.  After informed written and verbal consent, patient was seated on exam table. Left knee was prepped with alcohol swab and utilizing anterolateral approach, patient's left knee space was injected with 4 cc of 0.5% Marcaine and then injected 3 cc of 3 centrifuge PRP. Patient tolerated the procedure well without immediate complications. Impression and Recommendations:     This case required medical decision making of moderate complexity. The above documentation has been reviewed and is accurate and complete Marland Kitchen, DO       Note: This dictation was prepared with Dragon dictation along with smaller phrase technology. Any transcriptional errors that result from this process are unintentional.

## 2019-04-29 NOTE — Assessment & Plan Note (Addendum)
Continued significant amount of discomfort in the knees.  Has wanted to attempt a PRP injection with failing all other conservative therapy due to this chronic pain with mild exacerbation.  Patient wants to avoid surgical intervention.  We will try the injection, discussed topical anti-inflammatories.  Follow-up again in 4 to 8 weeks likely will do well.  Sent in doxycycline in case there is any significant increase in discomfort over the holiday weekend and patient is unable to get a hold of [place].  I believe the patient will do well.  Patient was concerned for the potential follow-up again in 6 weeks

## 2019-05-14 ENCOUNTER — Ambulatory Visit: Payer: Medicare HMO | Admitting: Family Medicine

## 2019-05-17 ENCOUNTER — Telehealth: Payer: Self-pay | Admitting: Family Medicine

## 2019-05-17 ENCOUNTER — Other Ambulatory Visit: Payer: Self-pay

## 2019-05-17 DIAGNOSIS — M170 Bilateral primary osteoarthritis of knee: Secondary | ICD-10-CM

## 2019-05-17 NOTE — Telephone Encounter (Signed)
Patient sent MyChart message. 

## 2019-05-17 NOTE — Progress Notes (Unsigned)
amb ref °

## 2019-05-17 NOTE — Telephone Encounter (Signed)
Pt had PRP on April 1. Called today as she states her knees are them same, if not a little worse. She is not due to recheck until 5/13, so she was not sure what her next move was.

## 2019-05-29 DIAGNOSIS — M170 Bilateral primary osteoarthritis of knee: Secondary | ICD-10-CM | POA: Diagnosis not present

## 2019-05-29 DIAGNOSIS — M1711 Unilateral primary osteoarthritis, right knee: Secondary | ICD-10-CM | POA: Diagnosis not present

## 2019-05-29 DIAGNOSIS — M1712 Unilateral primary osteoarthritis, left knee: Secondary | ICD-10-CM | POA: Diagnosis not present

## 2019-06-01 ENCOUNTER — Telehealth: Payer: Self-pay | Admitting: Family Medicine

## 2019-06-01 NOTE — Telephone Encounter (Signed)
Handicap form filled out for patient to pick up next week. At [employer].

## 2019-06-01 NOTE — Telephone Encounter (Signed)
Patient called asking if [name] would be willing to fill out the form for a handicap placard for her? If so, she would like a call when it is ready and she will pick it up.  (I did let her know that [name] it out this week so it would not be ready until next week as the earliest)

## 2019-06-10 ENCOUNTER — Encounter: Payer: Self-pay | Admitting: Family Medicine

## 2019-06-10 ENCOUNTER — Other Ambulatory Visit: Payer: Self-pay

## 2019-06-10 ENCOUNTER — Ambulatory Visit: Payer: Medicare HMO | Admitting: Family Medicine

## 2019-06-10 DIAGNOSIS — M170 Bilateral primary osteoarthritis of knee: Secondary | ICD-10-CM

## 2019-06-10 NOTE — Progress Notes (Signed)
[name] [address] [place] [address] Phone: [phone] Subjective:   [name], am serving as a scribe for Dr. Antoine Primas. This visit occurred during the SARS-CoV-2 public health emergency.  Safety protocols were in place, including screening questions prior to the visit, additional usage of staff PPE, and extensive cleaning of exam room while observing appropriate contact time as indicated for disinfecting solutions.   I'm seeing this patient by the request  of:  Myrlene Broker, MD  CC: bilateral knee pain   [id number]   04/29/2019 Continued significant amount of discomfort in the knees.  Has wanted to attempt a PRP injection with failing all other conservative therapy due to this chronic pain with mild exacerbation.  Patient wants to avoid surgical intervention.  We will try the injection, discussed topical anti-inflammatories.  Follow-up again in 4 to 8 weeks likely will do well.  Sent in doxycycline in case there is any significant increase in discomfort over the holiday weekend and patient is unable to get a hold of [place].  I believe the patient will do well.  Patient was concerned for the potential follow-up again in 6 weeks  Update  Kayla Key is a 74 y.o. female coming in with complaint of bilateral knee pain. Patient states that her pain is worse. Did not have any relief from PRP injections. L>R.   Attempted PRP in April 2021  Past Medical History:  Diagnosis Date  . Arthritis   . Asthma   . GERD (gastroesophageal reflux disease)   . Hypertension   . MHA (microangiopathic hemolytic anemia) (HCC)   . Osteoporosis    Past Surgical History:  Procedure Laterality Date  . BUNIONECTOMY Right   . KNEE ARTHROSCOPY Right 2009  . KNEE ARTHROSCOPY Left 2015  . TOE SURGERY Bilateral 2005  . TUBAL LIGATION  1988   Social History   Socioeconomic History  . Marital status: Widowed    Spouse name: Not on file  .  Number of children: Not on file  . Years of education: Not on file  . Highest education level: Not on file  Occupational History  . Not on file  Tobacco Use  . Smoking status: Never Smoker  . Smokeless tobacco: Never Used  Substance and Sexual Activity  . Alcohol use: No    Alcohol/week: 0.0 standard drinks  . Drug use: No  . Sexual activity: Not Currently  Other Topics Concern  . Not on file  Social History Narrative  . Not on file   Social Determinants of Health   Financial Resource Strain:   . Difficulty of Paying Living Expenses:   Food Insecurity:   . Worried About Programme researcher, broadcasting/film/video in the Last Year:   . Barista in the Last Year:   Transportation Needs:   . Freight forwarder (Medical):   Marland Kitchen Lack of Transportation (Non-Medical):   Physical Activity:   . Days of Exercise per Week:   . Minutes of Exercise per Session:   Stress:   . Feeling of Stress :   Social Connections:   . Frequency of Communication with Friends and Family:   . Frequency of Social Gatherings with Friends and Family:   . Attends Religious Services:   . Active Member of Clubs or Organizations:   . Attends Banker Meetings:   Marland Kitchen Marital Status:    No Known Allergies Family History  Problem Relation Age of Onset  .  Stroke Father   . Hypertension Sister   . Breast cancer Sister 66  . Cancer Other        breast   . Breast cancer Maternal Aunt 85  . Breast cancer Cousin 57  . Colon cancer Neg Hx      Current Outpatient Medications (Cardiovascular):  .  hydrochlorothiazide (HYDRODIURIL) 25 MG tablet, TAKE 1 TABLET BY MOUTH DAILY. OVERDUE FOR ANNUAL APPT MUST SEE PROVIDER FOR FUTURE REFILLS .  losartan (COZAAR) 50 MG tablet, Take 1 tablet (50 mg total) by mouth daily.     Current Outpatient Medications (Other):  Marland Kitchen  Multiple Vitamin (MULTI-VITAMIN DAILY PO), Take 1 tablet by mouth daily.   Reviewed prior external information including notes and imaging from    primary care provider As well as notes that were available from care everywhere and other healthcare systems.  Past medical history, social, surgical and family history all reviewed in electronic medical record.  No pertanent information unless stated regarding to the chief complaint.   Review of Systems:  No headache, visual changes, nausea, vomiting, diarrhea, constipation, dizziness, abdominal pain, skin rash, fevers, chills, night sweats, weight loss, swollen lymph nodes, body aches chest pain, shortness of breath, mood changes. POSITIVE muscle aches, joint swelling  Objective  Blood pressure 116/88, pulse 92, height 5\' 2"  (1.575 m), weight 141 lb (64 kg), SpO2 99 %.   General: No apparent distress alert and oriented x3 mood and affect normal, dressed appropriately.  HEENT: Pupils equal, extraocular movements intact  Respiratory: Patient's speak in full sentences and does not appear short of breath  Cardiovascular: Trace lower extremity edema, non tender, no erythema  Neuro: Cranial nerves II through XII are intact, neurovascularly intact in all extremities with 2+ DTRs and 2+ pulses.  Gait severely antalgic Knee: Bilateral valgus deformity noted.  Abnormal thigh to calf ratio.  Tender to palpation over medial and PF joint line.  ROM full in flexion and extension and lower leg rotation. instability with valgus force.  painful patellar compression. Patellar glide with moderate crepitus. Patellar and quadriceps tendons unremarkable. Hamstring and quadriceps strength is normal.    Impression and Recommendations:     The above documentation has been reviewed and is accurate and complete Lyndal Pulley, DO       Note: This dictation was prepared with Dragon dictation along with smaller phrase technology. Any transcriptional errors that result from this process are unintentional.

## 2019-06-10 NOTE — Assessment & Plan Note (Signed)
Bilateral arthritic changes of the knee noted.  Patient has responded well to the topical anti-inflammatory still somewhat.  Has not responded well to the injections letter unfortunately now her knee pain is continuing to keep her from doing activities with her family.  Patient will have the knee replacement.  Can follow-up with me as needed

## 2019-07-08 ENCOUNTER — Telehealth: Payer: Self-pay | Admitting: Internal Medicine

## 2019-07-08 NOTE — Telephone Encounter (Signed)
[facility] called asking if we had received a surgery clearance request for this patient sent last week. Advised that fax has been down, and asked to send to an alternate fax number: [phone]

## 2019-07-09 DIAGNOSIS — H524 Presbyopia: Secondary | ICD-10-CM | POA: Diagnosis not present

## 2019-07-15 ENCOUNTER — Ambulatory Visit: Payer: Self-pay

## 2019-07-15 ENCOUNTER — Encounter: Payer: Self-pay | Admitting: Family Medicine

## 2019-07-15 ENCOUNTER — Ambulatory Visit: Payer: Medicare HMO | Admitting: Family Medicine

## 2019-07-15 ENCOUNTER — Other Ambulatory Visit: Payer: Self-pay

## 2019-07-15 VITALS — BP 102/76 | HR 86 | Ht 62.0 in | Wt 144.6 lb

## 2019-07-15 DIAGNOSIS — M25561 Pain in right knee: Secondary | ICD-10-CM | POA: Diagnosis not present

## 2019-07-15 DIAGNOSIS — G8929 Other chronic pain: Secondary | ICD-10-CM | POA: Diagnosis not present

## 2019-07-15 DIAGNOSIS — M25562 Pain in left knee: Secondary | ICD-10-CM | POA: Diagnosis not present

## 2019-07-15 NOTE — Progress Notes (Signed)
I, Christoper Fabian, LAT, ATC, am serving as scribe for Dr. Clementeen Graham.  Kayla Key is a 74 y.o. female who presents to [facility] today for f/u of B knee pain.  She was last seen by Dr. Katrinka Blazing on 06/10/19 for f/u after PRP in B knees on 04/29/19.  She noted worsening pain at her last f/u visit.  Since her last visit w/ Dr. Katrinka Blazing, pt reports that her knee pain has worsened.  She states that she is going out of town this weekend and is looking for some relief from her knee pain.  She has total knee replacement scheduled with Dr. Turner Daniels on July 9.  Diagnostic testing: B knee XR- 09/18/17   Pertinent review of systems: No fevers or chills  Relevant historical information: Hypertension   Exam:  BP 102/76 (BP Location: Left Arm, Patient Position: Sitting, Cuff Size: Normal)   Pulse 86   Ht 5\' 2"  (1.575 m)   Wt 144 lb 9.6 oz (65.6 kg)   SpO2 98%   BMI 26.45 kg/m  General: Well Developed, well nourished, and in no acute distress.   MSK: Right knee mild effusion normal motion.  Left knee: Mild effusion normal motion.    Lab and Radiology Results  Procedure: Real-time Ultrasound Guided Injection of right knee Device: Philips Affiniti 50G Images permanently stored and available for review in the ultrasound unit. Verbal informed consent obtained.  Discussed risks and benefits of procedure. Warned about infection bleeding damage to structures skin hypopigmentation and fat atrophy among others. Patient expresses understanding and agreement Time-out conducted.   Noted no overlying erythema, induration, or other signs of local infection.   Skin prepped in a sterile fashion.   Local anesthesia: Topical Ethyl chloride.   With sterile technique and under real time ultrasound guidance:  40 mg of Kenalog and 2 mL of Marcaine injected easily.   Completed without difficulty   Pain immediately resolved suggesting accurate placement of the medication.   Advised  to call if fevers/chills, erythema, induration, drainage, or persistent bleeding.   Images permanently stored and available for review in the ultrasound unit.  Impression: Technically successful ultrasound guided injection.   Procedure: Real-time Ultrasound Guided Injection of left knee Device: Philips Affiniti 50G Images permanently stored and available for review in the ultrasound unit. Verbal informed consent obtained.  Discussed risks and benefits of procedure. Warned about infection bleeding damage to structures skin hypopigmentation and fat atrophy among others. Patient expresses understanding and agreement Time-out conducted.   Noted no overlying erythema, induration, or other signs of local infection.   Skin prepped in a sterile fashion.   Local anesthesia: Topical Ethyl chloride.   With sterile technique and under real time ultrasound guidance:  40 mg of Kenalog and 2 mL of Marcaine injected easily.   Completed without difficulty   Pain immediately resolved suggesting accurate placement of the medication.   Advised to call if fevers/chills, erythema, induration, drainage, or persistent bleeding.   Images permanently stored and available for review in the ultrasound unit.  Impression: Technically successful ultrasound guided injection.         EXAM: LEFT KNEE - COMPLETE 4+ VIEW; RIGHT KNEE - COMPLETE 4+ VIEW  COMPARISON:  None.  FINDINGS: Bones: No fracture or dislocation. Generalized osteopenia. No periostitis.  Joints: Normal alignment. No erosive changes. Severe medial femorotibial compartment joint space narrowing bilaterally. Mild left lateral femorotibial compartment joint space narrowing. No significant right lateral femorotibial compartment joint  space narrowing. No significant patellofemoral compartment joint space narrowing. No significant knee joint effusion.  Soft tissue: No soft tissue abnormality. No radiopaque foreign body. No  chondrocalcinosis.  IMPRESSION: Severe medial femorotibial compartment joint space narrowing bilaterally consistent with osteoarthritis.  Mild left lateral femorotibial compartment joint space narrowing consistent with mild osteoarthritis.   Electronically Signed   By: Kathreen Devoid   On: 09/18/2017 10:02  EXAM: LEFT KNEE - COMPLETE 4+ VIEW; RIGHT KNEE - COMPLETE 4+ VIEW  COMPARISON:  None.  FINDINGS: Bones: No fracture or dislocation. Generalized osteopenia. No periostitis.  Joints: Normal alignment. No erosive changes. Severe medial femorotibial compartment joint space narrowing bilaterally. Mild left lateral femorotibial compartment joint space narrowing. No significant right lateral femorotibial compartment joint space narrowing. No significant patellofemoral compartment joint space narrowing. No significant knee joint effusion.  Soft tissue: No soft tissue abnormality. No radiopaque foreign body. No chondrocalcinosis.  IMPRESSION: Severe medial femorotibial compartment joint space narrowing bilaterally consistent with osteoarthritis.  Mild left lateral femorotibial compartment joint space narrowing consistent with mild osteoarthritis.   Electronically Signed   By: Kathreen Devoid   On: 09/18/2017 10:02  I, Lynne Leader, personally (independently) visualized and performed the interpretation of the images attached in this note.   Assessment and Plan: 74 y.o. female with bilateral knee pain due to osteoarthritis.  Patient has failed conservative management.  She has upcoming total knee replacement.  She would like to have less pain prior to an upcoming trip this weekend.  Reasonable to proceed with steroid injection however I do not anticipate this will provide any kind of lasting benefit.  She already is proceeding with the definitive treatment for her knee pain due to osteoarthritis which is total knee replacement.  She had her first knee replacement on  July 9 with Dr. Mayer Camel and will have a second 1 done about 6 weeks after that.  Check back with myself or Dr. Tamala Julian as needed.   PDMP not reviewed this encounter. Orders Placed This Encounter  Procedures  . [place] LIMITED JOINT SPACE STRUCTURES LOW BILAT(NO LINKED CHARGES)    Order Specific Question:   Reason for Exam (SYMPTOM  OR DIAGNOSIS REQUIRED)    Answer:   B knee pain    Order Specific Question:   Preferred imaging location?    Answer:   [facility]   No orders of the defined types were placed in this encounter.    Discussed warning signs or symptoms. Please see discharge instructions. Patient expresses understanding.   The above documentation has been reviewed and is accurate and complete Lynne Leader, M.D.

## 2019-07-15 NOTE — Patient Instructions (Addendum)
Thank you for coming in today.   You had bilateral  knee injections today.  Call or go to the ER if you develop a large red swollen joint with extreme pain or oozing puss.   Recheck with [name] or myself as needed.   The injections will start working in a few days.

## 2019-07-16 ENCOUNTER — Other Ambulatory Visit: Payer: Self-pay | Admitting: Internal Medicine

## 2019-07-16 DIAGNOSIS — Z1231 Encounter for screening mammogram for malignant neoplasm of breast: Secondary | ICD-10-CM

## 2019-07-21 ENCOUNTER — Ambulatory Visit (INDEPENDENT_AMBULATORY_CARE_PROVIDER_SITE_OTHER): Payer: Medicare HMO | Admitting: Internal Medicine

## 2019-07-21 ENCOUNTER — Encounter: Payer: Self-pay | Admitting: Internal Medicine

## 2019-07-21 ENCOUNTER — Other Ambulatory Visit: Payer: Self-pay

## 2019-07-21 ENCOUNTER — Other Ambulatory Visit (INDEPENDENT_AMBULATORY_CARE_PROVIDER_SITE_OTHER): Payer: Medicare HMO

## 2019-07-21 VITALS — BP 122/78 | HR 79 | Temp 97.8°F | Ht 62.0 in | Wt 147.0 lb

## 2019-07-21 DIAGNOSIS — Z01810 Encounter for preprocedural cardiovascular examination: Secondary | ICD-10-CM | POA: Insufficient documentation

## 2019-07-21 DIAGNOSIS — I10 Essential (primary) hypertension: Secondary | ICD-10-CM

## 2019-07-21 DIAGNOSIS — R7301 Impaired fasting glucose: Secondary | ICD-10-CM

## 2019-07-21 LAB — CBC WITH DIFFERENTIAL/PLATELET
Basophils Absolute: 0.2 10*3/uL — ABNORMAL HIGH (ref 0.0–0.1)
Basophils Relative: 1.1 % (ref 0.0–3.0)
Eosinophils Absolute: 0.1 10*3/uL (ref 0.0–0.7)
Eosinophils Relative: 0.4 % (ref 0.0–5.0)
HCT: 41.3 % (ref 36.0–46.0)
Hemoglobin: 13.1 g/dL (ref 12.0–15.0)
Lymphocytes Relative: 17.2 % (ref 12.0–46.0)
Lymphs Abs: 2.7 10*3/uL (ref 0.7–4.0)
MCHC: 31.8 g/dL (ref 30.0–36.0)
MCV: 84 fl (ref 78.0–100.0)
Monocytes Absolute: 1.2 10*3/uL — ABNORMAL HIGH (ref 0.1–1.0)
Monocytes Relative: 7.5 % (ref 3.0–12.0)
Neutro Abs: 11.6 10*3/uL — ABNORMAL HIGH (ref 1.4–7.7)
Neutrophils Relative %: 73.8 % (ref 43.0–77.0)
Platelets: 382 10*3/uL (ref 150.0–400.0)
RBC: 4.91 Mil/uL (ref 3.87–5.11)
RDW: 14.7 % (ref 11.5–15.5)
WBC: 15.7 10*3/uL — ABNORMAL HIGH (ref 4.0–10.5)

## 2019-07-21 LAB — COMPREHENSIVE METABOLIC PANEL
ALT: 19 U/L (ref 0–35)
AST: 15 U/L (ref 0–37)
Albumin: 4.1 g/dL (ref 3.5–5.2)
Alkaline Phosphatase: 84 U/L (ref 39–117)
BUN: 22 mg/dL (ref 6–23)
CO2: 31 mEq/L (ref 19–32)
Calcium: 9.5 mg/dL (ref 8.4–10.5)
Chloride: 103 mEq/L (ref 96–112)
Creatinine, Ser: 0.78 mg/dL (ref 0.40–1.20)
GFR: 87.93 mL/min (ref >60.00)
Glucose, Bld: 94 mg/dL (ref 70–99)
Potassium: 4.4 mEq/L (ref 3.5–5.1)
Sodium: 138 mEq/L (ref 135–145)
Total Bilirubin: 0.5 mg/dL (ref 0.2–1.2)
Total Protein: 7.1 g/dL (ref 6.0–8.3)

## 2019-07-21 LAB — PROTIME-INR
INR: 1.1 ratio — ABNORMAL HIGH (ref 0.8–1.0)
Prothrombin Time: 11.9 s (ref 9.6–13.1)

## 2019-07-21 NOTE — Progress Notes (Signed)
   Subjective:   Patient ID: Kayla Key, female    DOB: 1945-06-05, 74 y.o.   MRN: [id number]  HPI The patient is a 74 YO female coming in for pre-op assessment. She denies chest pains. Having right knee surgery with spinal anesthesia. She is not able to walk up flight of stairs due to pain but can exercise in other ways without chest pain or SOB. Denies abdominal pain. BP is good. Denies headaches. Non-smoker.  PMH, [facility], social history reviewed and updated  Review of Systems  Constitutional: Negative.   HENT: Negative.   Eyes: Negative.   Respiratory: Negative for cough, chest tightness and shortness of breath.   Cardiovascular: Negative for chest pain, palpitations and leg swelling.  Gastrointestinal: Negative for abdominal distention, abdominal pain, constipation, diarrhea, nausea and vomiting.  Musculoskeletal: Positive for arthralgias.  Skin: Negative.   Neurological: Negative.   Psychiatric/Behavioral: Negative.     Objective:  Physical Exam Constitutional:      Appearance: She is well-developed.  HENT:     Head: Normocephalic and atraumatic.  Cardiovascular:     Rate and Rhythm: Normal rate and regular rhythm.  Pulmonary:     Effort: Pulmonary effort is normal. No respiratory distress.     Breath sounds: Normal breath sounds. No wheezing or rales.  Abdominal:     General: Bowel sounds are normal. There is no distension.     Palpations: Abdomen is soft.     Tenderness: There is no abdominal tenderness. There is no rebound.  Musculoskeletal:     Cervical back: Normal range of motion.  Skin:    General: Skin is warm and dry.  Neurological:     Mental Status: She is alert and oriented to person, place, and time.     Coordination: Coordination normal.     Vitals:   07/21/19 1039  BP: 122/78  Pulse: 79  Temp: 97.8 F (36.6 C)  TempSrc: Oral  SpO2: 98%  Weight: 147 lb (66.7 kg)  Height: 5\' 2"  (1.575 m)   EKG: Rate 64, axis normal, interval short PR not  new, sinus, no st or t wave changes, no significant change when compared to 2015  This visit occurred during the SARS-CoV-2 public health emergency.  Safety protocols were in place, including screening questions prior to the visit, additional usage of staff PPE, and extensive cleaning of exam room while observing appropriate contact time as indicated for disinfecting solutions.   Assessment & Plan:

## 2019-07-21 NOTE — Assessment & Plan Note (Signed)
BP at goal on losartan/hctz. Checking CMP and adjust as needed.  

## 2019-07-21 NOTE — Assessment & Plan Note (Signed)
Not due for repeat HgA1c.

## 2019-07-21 NOTE — Assessment & Plan Note (Signed)
EKG done today without change from prior. Checking labs. Overall low risk and would recommend to proceed without further evaluation. Advised about bowel management while on pain medication after surgery.

## 2019-07-27 DIAGNOSIS — M1711 Unilateral primary osteoarthritis, right knee: Secondary | ICD-10-CM | POA: Diagnosis not present

## 2019-07-28 ENCOUNTER — Telehealth: Payer: Self-pay | Admitting: Internal Medicine

## 2019-07-28 NOTE — Telephone Encounter (Signed)
OV note faxed

## 2019-07-28 NOTE — Telephone Encounter (Signed)
Kayla Key with [facility] called and was asking for office notes from 07/21/2019. They can be faxed to [phone]

## 2019-08-04 NOTE — Telephone Encounter (Signed)
Lurena Joiner with [facility] calling back and states that they have not received OV notes. Spoke with assistant, Shirron, and she is going to print them out and I will fax them to [phone].

## 2019-08-06 DIAGNOSIS — M21161 Varus deformity, not elsewhere classified, right knee: Secondary | ICD-10-CM | POA: Diagnosis not present

## 2019-08-06 DIAGNOSIS — Z96651 Presence of right artificial knee joint: Secondary | ICD-10-CM | POA: Diagnosis not present

## 2019-08-06 DIAGNOSIS — G8918 Other acute postprocedural pain: Secondary | ICD-10-CM | POA: Diagnosis not present

## 2019-08-06 DIAGNOSIS — M1711 Unilateral primary osteoarthritis, right knee: Secondary | ICD-10-CM | POA: Diagnosis not present

## 2019-08-07 DIAGNOSIS — Z7982 Long term (current) use of aspirin: Secondary | ICD-10-CM | POA: Diagnosis not present

## 2019-08-07 DIAGNOSIS — Z96651 Presence of right artificial knee joint: Secondary | ICD-10-CM | POA: Diagnosis not present

## 2019-08-07 DIAGNOSIS — M1711 Unilateral primary osteoarthritis, right knee: Secondary | ICD-10-CM | POA: Diagnosis not present

## 2019-08-07 DIAGNOSIS — Z471 Aftercare following joint replacement surgery: Secondary | ICD-10-CM | POA: Diagnosis not present

## 2019-08-10 DIAGNOSIS — Z7982 Long term (current) use of aspirin: Secondary | ICD-10-CM | POA: Diagnosis not present

## 2019-08-10 DIAGNOSIS — Z96651 Presence of right artificial knee joint: Secondary | ICD-10-CM | POA: Diagnosis not present

## 2019-08-10 DIAGNOSIS — Z471 Aftercare following joint replacement surgery: Secondary | ICD-10-CM | POA: Diagnosis not present

## 2019-08-11 DIAGNOSIS — Z96651 Presence of right artificial knee joint: Secondary | ICD-10-CM | POA: Diagnosis not present

## 2019-08-11 DIAGNOSIS — Z471 Aftercare following joint replacement surgery: Secondary | ICD-10-CM | POA: Diagnosis not present

## 2019-08-11 DIAGNOSIS — Z7982 Long term (current) use of aspirin: Secondary | ICD-10-CM | POA: Diagnosis not present

## 2019-08-12 DIAGNOSIS — Z471 Aftercare following joint replacement surgery: Secondary | ICD-10-CM | POA: Diagnosis not present

## 2019-08-12 DIAGNOSIS — Z96651 Presence of right artificial knee joint: Secondary | ICD-10-CM | POA: Diagnosis not present

## 2019-08-12 DIAGNOSIS — Z7982 Long term (current) use of aspirin: Secondary | ICD-10-CM | POA: Diagnosis not present

## 2019-08-16 DIAGNOSIS — Z96651 Presence of right artificial knee joint: Secondary | ICD-10-CM | POA: Diagnosis not present

## 2019-08-16 DIAGNOSIS — Z7982 Long term (current) use of aspirin: Secondary | ICD-10-CM | POA: Diagnosis not present

## 2019-08-16 DIAGNOSIS — Z471 Aftercare following joint replacement surgery: Secondary | ICD-10-CM | POA: Diagnosis not present

## 2019-08-17 DIAGNOSIS — M6281 Muscle weakness (generalized): Secondary | ICD-10-CM | POA: Diagnosis not present

## 2019-08-17 DIAGNOSIS — M1712 Unilateral primary osteoarthritis, left knee: Secondary | ICD-10-CM | POA: Diagnosis not present

## 2019-08-17 DIAGNOSIS — M25661 Stiffness of right knee, not elsewhere classified: Secondary | ICD-10-CM | POA: Diagnosis not present

## 2019-08-17 DIAGNOSIS — Z96651 Presence of right artificial knee joint: Secondary | ICD-10-CM | POA: Diagnosis not present

## 2019-08-17 DIAGNOSIS — M25562 Pain in left knee: Secondary | ICD-10-CM | POA: Diagnosis not present

## 2019-08-23 DIAGNOSIS — M6281 Muscle weakness (generalized): Secondary | ICD-10-CM | POA: Diagnosis not present

## 2019-08-23 DIAGNOSIS — Z96651 Presence of right artificial knee joint: Secondary | ICD-10-CM | POA: Diagnosis not present

## 2019-08-23 DIAGNOSIS — M25661 Stiffness of right knee, not elsewhere classified: Secondary | ICD-10-CM | POA: Diagnosis not present

## 2019-08-25 DIAGNOSIS — M25661 Stiffness of right knee, not elsewhere classified: Secondary | ICD-10-CM | POA: Diagnosis not present

## 2019-08-25 DIAGNOSIS — M6281 Muscle weakness (generalized): Secondary | ICD-10-CM | POA: Diagnosis not present

## 2019-08-25 DIAGNOSIS — Z96651 Presence of right artificial knee joint: Secondary | ICD-10-CM | POA: Diagnosis not present

## 2019-08-27 ENCOUNTER — Ambulatory Visit (INDEPENDENT_AMBULATORY_CARE_PROVIDER_SITE_OTHER): Payer: Medicare HMO | Admitting: Internal Medicine

## 2019-08-27 ENCOUNTER — Other Ambulatory Visit: Payer: Self-pay

## 2019-08-27 ENCOUNTER — Encounter: Payer: Self-pay | Admitting: Internal Medicine

## 2019-08-27 DIAGNOSIS — M170 Bilateral primary osteoarthritis of knee: Secondary | ICD-10-CM | POA: Diagnosis not present

## 2019-08-27 NOTE — Progress Notes (Signed)
° °  Subjective:   Patient ID: Kayla Key, female    DOB: [birth year], 74 y.o.   MRN: [id number]  HPI The patient is a 74 YO female coming in for concerns about left leg pain. Recently had right knee placement and since that time her left leg has been acting up more. She has some skin color changes which are chronic and she was wanting to ask about. Denies instability or fall. Scheduled in near future to have left knee replacement but she may delay that slightly to allow more time for right knee healing.   Review of Systems  Constitutional: Negative.   HENT: Negative.   Eyes: Negative.   Respiratory: Negative for cough, chest tightness and shortness of breath.   Cardiovascular: Negative for chest pain, palpitations and leg swelling.  Gastrointestinal: Negative for abdominal distention, abdominal pain, constipation, diarrhea, nausea and vomiting.  Musculoskeletal: Positive for arthralgias, joint swelling and myalgias.  Skin: Positive for color change.  Neurological: Negative.   Psychiatric/Behavioral: Negative.     Objective:  Physical Exam Constitutional:      Appearance: She is well-developed.  HENT:     Head: Normocephalic and atraumatic.  Cardiovascular:     Rate and Rhythm: Normal rate and regular rhythm.  Pulmonary:     Effort: Pulmonary effort is normal. No respiratory distress.     Breath sounds: Normal breath sounds. No wheezing or rales.  Abdominal:     General: Bowel sounds are normal. There is no distension.     Palpations: Abdomen is soft.     Tenderness: There is no abdominal tenderness. There is no rebound.  Musculoskeletal:        General: Swelling and tenderness present.     Cervical back: Normal range of motion.  Skin:    General: Skin is warm and dry.  Neurological:     Mental Status: She is alert and oriented to person, place, and time.     Coordination: Coordination normal.     Vitals:   08/27/19 1040  BP: 114/70  Pulse: (!) 107  Temp: 98.2 F  (36.8 C)  TempSrc: Oral  SpO2: 99%  Weight: 142 lb (64.4 kg)  Height: 5\' 2"  (1.575 m)    This visit occurred during the SARS-CoV-2 public health emergency.  Safety protocols were in place, including screening questions prior to the visit, additional usage of staff PPE, and extensive cleaning of exam room while observing appropriate contact time as indicated for disinfecting solutions.   Assessment & Plan:  Visit time 15 minutes in face to face communication with patient and coordination of care, additional 5 minutes spent in record review, coordination or care, ordering tests, communicating/referring to other healthcare professionals, documenting in medical records all on the same day of the visit for total time 20 minutes spent on the visit.

## 2019-08-27 NOTE — Assessment & Plan Note (Signed)
Advised that findings on the skin normal and related to veins. She does not need intervention or treatment for that. Advised on proper usage of cane with recent right knee replacement and given information about left knee replacement.

## 2019-08-31 DIAGNOSIS — M25661 Stiffness of right knee, not elsewhere classified: Secondary | ICD-10-CM | POA: Diagnosis not present

## 2019-08-31 DIAGNOSIS — Z96651 Presence of right artificial knee joint: Secondary | ICD-10-CM | POA: Diagnosis not present

## 2019-08-31 DIAGNOSIS — M6281 Muscle weakness (generalized): Secondary | ICD-10-CM | POA: Diagnosis not present

## 2019-09-02 DIAGNOSIS — M25661 Stiffness of right knee, not elsewhere classified: Secondary | ICD-10-CM | POA: Diagnosis not present

## 2019-09-02 DIAGNOSIS — Z96651 Presence of right artificial knee joint: Secondary | ICD-10-CM | POA: Diagnosis not present

## 2019-09-02 DIAGNOSIS — M6281 Muscle weakness (generalized): Secondary | ICD-10-CM | POA: Diagnosis not present

## 2019-09-07 ENCOUNTER — Ambulatory Visit: Payer: Medicare HMO

## 2019-09-07 DIAGNOSIS — M25661 Stiffness of right knee, not elsewhere classified: Secondary | ICD-10-CM | POA: Diagnosis not present

## 2019-09-07 DIAGNOSIS — M6281 Muscle weakness (generalized): Secondary | ICD-10-CM | POA: Diagnosis not present

## 2019-09-07 DIAGNOSIS — Z96651 Presence of right artificial knee joint: Secondary | ICD-10-CM | POA: Diagnosis not present

## 2019-09-08 ENCOUNTER — Ambulatory Visit
Admission: RE | Admit: 2019-09-08 | Discharge: 2019-09-08 | Disposition: A | Discharge status: Home / Self Care | Payer: Medicare HMO | Source: Ambulatory Visit | Attending: Internal Medicine | Admitting: Internal Medicine

## 2019-09-08 ENCOUNTER — Other Ambulatory Visit: Payer: Self-pay

## 2019-09-08 DIAGNOSIS — Z1231 Encounter for screening mammogram for malignant neoplasm of breast: Secondary | ICD-10-CM | POA: Diagnosis not present

## 2019-09-09 DIAGNOSIS — Z96651 Presence of right artificial knee joint: Secondary | ICD-10-CM | POA: Diagnosis not present

## 2019-09-09 DIAGNOSIS — M25661 Stiffness of right knee, not elsewhere classified: Secondary | ICD-10-CM | POA: Diagnosis not present

## 2019-09-09 DIAGNOSIS — M6281 Muscle weakness (generalized): Secondary | ICD-10-CM | POA: Diagnosis not present

## 2019-09-14 DIAGNOSIS — M6281 Muscle weakness (generalized): Secondary | ICD-10-CM | POA: Diagnosis not present

## 2019-09-14 DIAGNOSIS — M25661 Stiffness of right knee, not elsewhere classified: Secondary | ICD-10-CM | POA: Diagnosis not present

## 2019-09-14 DIAGNOSIS — Z96651 Presence of right artificial knee joint: Secondary | ICD-10-CM | POA: Diagnosis not present

## 2019-09-16 DIAGNOSIS — M25661 Stiffness of right knee, not elsewhere classified: Secondary | ICD-10-CM | POA: Diagnosis not present

## 2019-09-16 DIAGNOSIS — Z96651 Presence of right artificial knee joint: Secondary | ICD-10-CM | POA: Diagnosis not present

## 2019-09-16 DIAGNOSIS — M6281 Muscle weakness (generalized): Secondary | ICD-10-CM | POA: Diagnosis not present

## 2019-09-23 DIAGNOSIS — Z96651 Presence of right artificial knee joint: Secondary | ICD-10-CM | POA: Diagnosis not present

## 2019-09-23 DIAGNOSIS — M25661 Stiffness of right knee, not elsewhere classified: Secondary | ICD-10-CM | POA: Diagnosis not present

## 2019-09-23 DIAGNOSIS — M6281 Muscle weakness (generalized): Secondary | ICD-10-CM | POA: Diagnosis not present

## 2019-09-24 DIAGNOSIS — Z96651 Presence of right artificial knee joint: Secondary | ICD-10-CM | POA: Diagnosis not present

## 2019-09-24 DIAGNOSIS — M6281 Muscle weakness (generalized): Secondary | ICD-10-CM | POA: Diagnosis not present

## 2019-09-24 DIAGNOSIS — M25661 Stiffness of right knee, not elsewhere classified: Secondary | ICD-10-CM | POA: Diagnosis not present

## 2019-09-29 ENCOUNTER — Ambulatory Visit (INDEPENDENT_AMBULATORY_CARE_PROVIDER_SITE_OTHER): Payer: Medicare HMO | Admitting: Internal Medicine

## 2019-09-29 ENCOUNTER — Other Ambulatory Visit: Payer: Self-pay

## 2019-09-29 ENCOUNTER — Encounter: Payer: Self-pay | Admitting: Internal Medicine

## 2019-09-29 VITALS — BP 132/86 | HR 94 | Temp 98.1°F | Resp 16 | Ht 62.0 in | Wt 141.0 lb

## 2019-09-29 DIAGNOSIS — I10 Essential (primary) hypertension: Secondary | ICD-10-CM

## 2019-09-29 DIAGNOSIS — Z23 Encounter for immunization: Secondary | ICD-10-CM | POA: Diagnosis not present

## 2019-09-29 DIAGNOSIS — R7303 Prediabetes: Secondary | ICD-10-CM

## 2019-09-29 NOTE — Patient Instructions (Signed)

## 2019-09-29 NOTE — Progress Notes (Signed)
Subjective:  Patient ID: Kayla Key, female    DOB: [birth year]  Age: 74 y.o. MRN: [id number]  CC: Hypertension  This visit occurred during the SARS-CoV-2 public health emergency.  Safety protocols were in place, including screening questions prior to the visit, additional usage of staff PPE, and extensive cleaning of exam room while observing appropriate contact time as indicated for disinfecting solutions.   NEW TO ME  HPI MARIELL Key presents for f/up - She comes in today for preop clearance. She is scheduled to have total knee replacement soon. She tells me her blood pressure has been well controlled. She is active and denies any recent episodes of chest pain, shortness of breath, palpitations, edema, or fatigue.  Outpatient Medications Prior to Visit  Medication Sig Dispense Refill  . hydrochlorothiazide (HYDRODIURIL) 25 MG tablet TAKE 1 TABLET BY MOUTH DAILY. OVERDUE FOR ANNUAL APPT MUST SEE PROVIDER FOR FUTURE REFILLS 90 tablet 1  . losartan (COZAAR) 50 MG tablet Take 1 tablet (50 mg total) by mouth daily. 90 tablet 3  . Multiple Vitamin (MULTI-VITAMIN DAILY PO) Take 1 tablet by mouth daily.     No facility-administered medications prior to visit.    ROS Review of Systems  Constitutional: Negative for diaphoresis, fatigue and unexpected weight change.  HENT: Negative.   Eyes: Negative for visual disturbance.  Respiratory: Negative for apnea, cough, chest tightness, shortness of breath and wheezing.   Cardiovascular: Negative for chest pain, palpitations and leg swelling.  Gastrointestinal: Negative for abdominal pain, blood in stool, constipation, diarrhea, nausea and vomiting.  Endocrine: Negative.   Genitourinary: Negative.  Negative for difficulty urinating.  Musculoskeletal: Negative for arthralgias and neck pain.  Skin: Negative.   Neurological: Negative.  Negative for dizziness, weakness and headaches.  Hematological: Negative for adenopathy. Does not  bruise/bleed easily.  Psychiatric/Behavioral: Negative.     Objective:  BP 132/86   Pulse 94   Temp 98.1 F (36.7 C) (Oral)   Resp 16   Ht 5\' 2"  (1.575 m)   Wt 141 lb (64 kg)   SpO2 97%   BMI 25.79 kg/m   BP Readings from Last 3 Encounters:  09/29/19 132/86  08/27/19 114/70  07/21/19 122/78    Wt Readings from Last 3 Encounters:  09/29/19 141 lb (64 kg)  08/27/19 142 lb (64.4 kg)  07/21/19 147 lb (66.7 kg)    Physical Exam Vitals reviewed.  HENT:     Nose: Nose normal.     Mouth/Throat:     Mouth: Mucous membranes are moist.  Eyes:     General: No scleral icterus.    Conjunctiva/sclera: Conjunctivae normal.  Cardiovascular:     Rate and Rhythm: Normal rate and regular rhythm.     Heart sounds: No murmur heard.   Pulmonary:     Effort: Pulmonary effort is normal.     Breath sounds: No stridor. No wheezing, rhonchi or rales.  Abdominal:     General: Abdomen is flat.     Palpations: There is no mass.     Tenderness: There is no abdominal tenderness.  Musculoskeletal:        General: Normal range of motion.     Cervical back: Neck supple.     Right lower leg: No edema.     Left lower leg: No edema.  Lymphadenopathy:     Cervical: No cervical adenopathy.  Skin:    General: Skin is warm and dry.  Neurological:     General: No focal  deficit present.     Mental Status: She is alert.  Psychiatric:        Mood and Affect: Mood normal.        Behavior: Behavior normal.     Lab Results  Component Value Date   WBC 8.3 09/29/2019   HGB 12.3 09/29/2019   HCT 38.7 09/29/2019   PLT 400 09/29/2019   GLUCOSE 96 09/29/2019   CHOL 219 (H) 11/02/2018   TRIG 62.0 11/02/2018   HDL 76.10 11/02/2018   LDLDIRECT 101.4 06/14/2009   LDLCALC 131 (H) 11/02/2018   ALT 19 07/21/2019   AST 15 07/21/2019   NA 142 09/29/2019   K 4.2 09/29/2019   CL 103 09/29/2019   CREATININE 0.81 09/29/2019   BUN 20 09/29/2019   CO2 30 09/29/2019   TSH 1.59 08/18/2018   INR 1.1 (H)  07/21/2019   HGBA1C 5.7 (H) 09/29/2019    MM 3D SCREEN BREAST BILATERAL  Result Date: 09/08/2019 CLINICAL DATA:  Screening. EXAM: DIGITAL SCREENING BILATERAL MAMMOGRAM WITH TOMO AND CAD COMPARISON:  Previous exam(s). ACR Breast Density Category b: There are scattered areas of fibroglandular density. FINDINGS: There are no findings suspicious for malignancy. Images were processed with CAD. IMPRESSION: No mammographic evidence of malignancy. A result letter of this screening mammogram will be mailed directly to the patient. RECOMMENDATION: Screening mammogram in one year. (Code:SM-B-01Y) BI-RADS CATEGORY  1: Negative. Electronically Signed   By: Amie Portland M.D.   On: 09/08/2019 15:27    Assessment & Plan:   [name] was seen today for hypertension.  Diagnoses and all orders for this visit:  Essential hypertension- Her blood pressure is well controlled. Electrolytes and renal function are normal. Her MICA risk is only 0.2%. She is cleared for orthopedic surgery. -     CBC with Differential/Platelet; Future -     BASIC METABOLIC PANEL WITH GFR; Future -     BASIC METABOLIC PANEL WITH GFR -     CBC with Differential/Platelet  Prediabetes- Her blood sugar is adequately well controlled. -     BASIC METABOLIC PANEL WITH GFR; Future -     Hemoglobin A1c; Future -     Hemoglobin A1c -     BASIC METABOLIC PANEL WITH GFR  Other orders -     Flu Vaccine QUAD 6+ mos PF IM (Fluarix Quad PF)   I am having [name]. [name] "[name]" maintain her Multiple Vitamin (MULTI-VITAMIN DAILY PO), hydrochlorothiazide, and losartan.  No orders of the defined types were placed in this encounter.    Follow-up: Return in about 6 months (around 03/28/2020).  Kayla Linger, MD

## 2019-09-30 ENCOUNTER — Encounter: Payer: Self-pay | Admitting: Internal Medicine

## 2019-09-30 LAB — BASIC METABOLIC PANEL WITH GFR
BUN: 20 mg/dL (ref 7–25)
CO2: 30 mmol/L (ref 20–32)
Calcium: 9.2 mg/dL (ref 8.6–10.4)
Chloride: 103 mmol/L (ref 98–110)
Creat: 0.81 mg/dL (ref 0.60–0.93)
GFR, Est African American: 85 mL/min/{1.73_m2} (ref >=60)
GFR, Est Non African American: 73 mL/min/{1.73_m2} (ref >=60)
Glucose, Bld: 96 mg/dL (ref 65–99)
Potassium: 4.2 mmol/L (ref 3.5–5.3)
Sodium: 142 mmol/L (ref 135–146)

## 2019-09-30 LAB — CBC WITH DIFFERENTIAL/PLATELET
Absolute Monocytes: 706 cells/uL (ref 200–950)
Basophils Absolute: 50 cells/uL (ref 0–200)
Basophils Relative: 0.6 %
Eosinophils Absolute: 100 cells/uL (ref 15–500)
Eosinophils Relative: 1.2 %
HCT: 38.7 % (ref 35.0–45.0)
Hemoglobin: 12.3 g/dL (ref 11.7–15.5)
Lymphs Abs: 2092 cells/uL (ref 850–3900)
MCH: 26.9 pg — ABNORMAL LOW (ref 27.0–33.0)
MCHC: 31.8 g/dL — ABNORMAL LOW (ref 32.0–36.0)
MCV: 84.5 fL (ref 80.0–100.0)
MPV: 10.6 fL (ref 7.5–12.5)
Monocytes Relative: 8.5 %
Neutro Abs: 5354 cells/uL (ref 1500–7800)
Neutrophils Relative %: 64.5 %
Platelets: 400 10*3/uL (ref 140–400)
RBC: 4.58 10*6/uL (ref 3.80–5.10)
RDW: 13.2 % (ref 11.0–15.0)
Total Lymphocyte: 25.2 %
WBC: 8.3 10*3/uL (ref 3.8–10.8)

## 2019-09-30 LAB — HEMOGLOBIN A1C
Hgb A1c MFr Bld: 5.7 % of total Hgb — ABNORMAL HIGH (ref <5.7)
Mean Plasma Glucose: 117 (calc)
eAG (mmol/L): 6.5 (calc)

## 2019-10-11 ENCOUNTER — Other Ambulatory Visit: Payer: Self-pay | Admitting: Internal Medicine

## 2019-10-24 DIAGNOSIS — Z20828 Contact with and (suspected) exposure to other viral communicable diseases: Secondary | ICD-10-CM | POA: Diagnosis not present

## 2019-10-29 ENCOUNTER — Telehealth: Payer: Self-pay | Admitting: Family Medicine

## 2019-10-29 DIAGNOSIS — Z20828 Contact with and (suspected) exposure to other viral communicable diseases: Secondary | ICD-10-CM | POA: Diagnosis not present

## 2019-10-29 NOTE — Telephone Encounter (Signed)
Patient called asking if [name] would be able to complete a new handicap placard for her. The one she has currently is going to expire this month and she is scheduled for another knee replacement soon.  Please call patient when ready to pick up if possible.

## 2019-10-29 NOTE — Telephone Encounter (Signed)
Called patient to let her know paperwork is done and is at front desk.

## 2019-11-04 DIAGNOSIS — Z01812 Encounter for preprocedural laboratory examination: Secondary | ICD-10-CM | POA: Diagnosis not present

## 2019-11-09 DIAGNOSIS — M1712 Unilateral primary osteoarthritis, left knee: Secondary | ICD-10-CM | POA: Diagnosis not present

## 2019-11-09 DIAGNOSIS — Z96651 Presence of right artificial knee joint: Secondary | ICD-10-CM | POA: Diagnosis not present

## 2019-11-09 DIAGNOSIS — Z471 Aftercare following joint replacement surgery: Secondary | ICD-10-CM | POA: Diagnosis not present

## 2019-11-24 DIAGNOSIS — Z96651 Presence of right artificial knee joint: Secondary | ICD-10-CM | POA: Diagnosis not present

## 2019-11-24 DIAGNOSIS — M6281 Muscle weakness (generalized): Secondary | ICD-10-CM | POA: Diagnosis not present

## 2019-11-25 ENCOUNTER — Ambulatory Visit: Payer: Medicare HMO

## 2019-12-01 DIAGNOSIS — M6281 Muscle weakness (generalized): Secondary | ICD-10-CM | POA: Diagnosis not present

## 2019-12-01 DIAGNOSIS — Z96651 Presence of right artificial knee joint: Secondary | ICD-10-CM | POA: Diagnosis not present

## 2019-12-07 DIAGNOSIS — M6281 Muscle weakness (generalized): Secondary | ICD-10-CM | POA: Diagnosis not present

## 2019-12-07 DIAGNOSIS — Z96651 Presence of right artificial knee joint: Secondary | ICD-10-CM | POA: Diagnosis not present

## 2019-12-12 NOTE — Progress Notes (Deleted)
[name] [address] [place] [address] Phone: [phone] Subjective:    I'm seeing this patient by the request  of:  Myrlene Broker, MD  CC:   [id number]   06/10/2019 Bilateral arthritic changes of the knee noted.  Patient has responded well to the topical anti-inflammatory still somewhat.  Has not responded well to the injections letter unfortunately now her knee pain is continuing to keep her from doing activities with her family.  Patient will have the knee replacement.  Can follow-up with me as needed  Update 12/13/2019 Kayla Key is a 74 y.o. female coming in with complaint of bilateral knee pain. Patient states       Past Medical History:  Diagnosis Date  . Arthritis   . Asthma   . GERD (gastroesophageal reflux disease)   . Hypertension   . MHA (microangiopathic hemolytic anemia) (HCC)   . Osteoporosis    Past Surgical History:  Procedure Laterality Date  . BUNIONECTOMY Right   . KNEE ARTHROSCOPY Right 2009  . KNEE ARTHROSCOPY Left 2015  . TOE SURGERY Bilateral 2005  . TUBAL LIGATION  1988   Social History   Socioeconomic History  . Marital status: Widowed    Spouse name: Not on file  . Number of children: Not on file  . Years of education: Not on file  . Highest education level: Not on file  Occupational History  . Not on file  Tobacco Use  . Smoking status: Never Smoker  . Smokeless tobacco: Never Used  Vaping Use  . Vaping Use: Never used  Substance and Sexual Activity  . Alcohol use: No    Alcohol/week: 0.0 standard drinks  . Drug use: No  . Sexual activity: Not Currently  Other Topics Concern  . Not on file  Social History Narrative  . Not on file   Social Determinants of Health   Financial Resource Strain:   . Difficulty of Paying Living Expenses: Not on file  Food Insecurity:   . Worried About Programme researcher, broadcasting/film/video in the Last Year: Not on file  . Ran Out of Food in the Last  Year: Not on file  Transportation Needs:   . Lack of Transportation (Medical): Not on file  . Lack of Transportation (Non-Medical): Not on file  Physical Activity:   . Days of Exercise per Week: Not on file  . Minutes of Exercise per Session: Not on file  Stress:   . Feeling of Stress : Not on file  Social Connections:   . Frequency of Communication with Friends and Family: Not on file  . Frequency of Social Gatherings with Friends and Family: Not on file  . Attends Religious Services: Not on file  . Active Member of Clubs or Organizations: Not on file  . Attends Banker Meetings: Not on file  . Marital Status: Not on file   No Known Allergies Family History  Problem Relation Age of Onset  . Stroke Father   . Hypertension Sister   . Breast cancer Sister 60  . Cancer Other        breast   . Breast cancer Maternal Aunt 85  . Breast cancer Cousin 40  . Colon cancer Neg Hx      Current Outpatient Medications (Cardiovascular):  .  hydrochlorothiazide (HYDRODIURIL) 25 MG tablet, Take 1 tablet (25 mg total) by mouth daily. Marland Kitchen  losartan (COZAAR) 50 MG tablet, Take 1 tablet (50 mg  total) by mouth daily.     Current Outpatient Medications (Other):  Marland Kitchen  Multiple Vitamin (MULTI-VITAMIN DAILY PO), Take 1 tablet by mouth daily.   Reviewed prior external information including notes and imaging from  primary care provider As well as notes that were available from care everywhere and other healthcare systems.  Past medical history, social, surgical and family history all reviewed in electronic medical record.  No pertanent information unless stated regarding to the chief complaint.   Review of Systems:  No headache, visual changes, nausea, vomiting, diarrhea, constipation, dizziness, abdominal pain, skin rash, fevers, chills, night sweats, weight loss, swollen lymph nodes, body aches, joint swelling, chest pain, shortness of breath, mood changes. POSITIVE muscle  aches  Objective  There were no vitals taken for this visit.   General: No apparent distress alert and oriented x3 mood and affect normal, dressed appropriately.  HEENT: Pupils equal, extraocular movements intact  Respiratory: Patient's speak in full sentences and does not appear short of breath  Cardiovascular: No lower extremity edema, non tender, no erythema  Neuro: Cranial nerves II through XII are intact, neurovascularly intact in all extremities with 2+ DTRs and 2+ pulses.  Gait normal with good balance and coordination.  MSK:  Non tender with full range of motion and good stability and symmetric strength and tone of shoulders, elbows, wrist, hip, knee and ankles bilaterally.     Impression and Recommendations:     The above documentation has been reviewed and is accurate and complete [name], DO

## 2019-12-13 ENCOUNTER — Ambulatory Visit: Payer: Medicare HMO | Admitting: Family Medicine

## 2019-12-13 ENCOUNTER — Ambulatory Visit: Payer: Self-pay

## 2019-12-13 ENCOUNTER — Other Ambulatory Visit: Payer: Self-pay

## 2019-12-13 ENCOUNTER — Encounter: Payer: Self-pay | Admitting: Family Medicine

## 2019-12-13 VITALS — BP 136/86 | HR 92 | Ht 62.0 in | Wt 140.8 lb

## 2019-12-13 DIAGNOSIS — G8929 Other chronic pain: Secondary | ICD-10-CM

## 2019-12-13 DIAGNOSIS — M25561 Pain in right knee: Secondary | ICD-10-CM | POA: Diagnosis not present

## 2019-12-13 DIAGNOSIS — M25562 Pain in left knee: Secondary | ICD-10-CM | POA: Diagnosis not present

## 2019-12-13 NOTE — Patient Instructions (Signed)
Thank you for coming in today.  Good luck with surgery.  Recheck as needed.   Call or go to the ER if you develop a large red swollen joint with extreme pain or oozing puss.

## 2019-12-13 NOTE — Progress Notes (Signed)
   Wynema Birch, am serving as a Neurosurgeon for Dr. Clementeen Graham.  Kayla Key is a 74 y.o. female who presents to [facility] today for L knee pn. L total knee replacement is scheduled for 12/17. Pt has been delaying surgery for several months, waiting for the R knee to feel stronger since it's replacement.  Left knee is scheduled to be replaced December 17,   Pertinent review of systems: No fevers or chills  Relevant historical information: Hypertension, osteopenia   Exam:  BP 136/86   Pulse 92   Ht 5\' 2"  (1.575 m)   Wt 140 lb 12.8 oz (63.9 kg)   SpO2 97%   BMI 25.75 kg/m  General: Well Developed, well nourished, and in no acute distress.   MSK: Right knee well-appearing mature scar anterior knee. Left knee minimal effusion. Nontender. Normal motion with crepitation.    Lab and Radiology Results  Procedure: Real-time Ultrasound Guided Injection of left knee superior lateral patellar space Device: Philips Affiniti 50G Images permanently stored and available for review in PACS Verbal informed consent obtained.  Discussed risks and benefits of procedure. Warned about infection bleeding damage to structures skin hypopigmentation and fat atrophy among others. Patient expresses understanding and agreement Time-out conducted.   Noted no overlying erythema, induration, or other signs of local infection.   Skin prepped in a sterile fashion.   Local anesthesia: Topical Ethyl chloride.   With sterile technique and under real time ultrasound guidance:  40 mg of Kenalog and 2 mL of Marcaine injected into knee. Fluid seen entering the joint capsule.   Completed without difficulty   Pain immediately resolved suggesting accurate placement of the medication.   Advised to call if fevers/chills, erythema, induration, drainage, or persistent bleeding.   Images permanently stored and available for review in the ultrasound unit.  Impression: Technically  successful ultrasound guided injection.      Assessment and Plan: 74 y.o. female with left knee pain due to DJD.  Patient is in the process of waiting for left total knee replacement.  She would like an injection to buy her some time until the surgery scheduled next month.  I think this is reasonable.  Plan for steroid injection.  Continue conservative management strategies to improve knee pain including Voltaren gel.  Recheck back as needed.   PDMP not reviewed this encounter. Orders Placed This Encounter  Procedures  . 61 LIMITED JOINT SPACE STRUCTURES LOW RIGHT(NO LINKED CHARGES)    Standing Status:   Future    Number of Occurrences:   1    Standing Expiration Date:   06/11/2020    Order Specific Question:   Reason for Exam (SYMPTOM  OR DIAGNOSIS REQUIRED)    Answer:   Chronic right knee pain    Order Specific Question:   Preferred imaging location?    Answer:   [facility]   No orders of the defined types were placed in this encounter.    Discussed warning signs or symptoms. Please see discharge instructions. Patient expresses understanding.   The above documentation has been reviewed and is accurate and complete 06/13/2020, M.D.

## 2019-12-15 DIAGNOSIS — Z96651 Presence of right artificial knee joint: Secondary | ICD-10-CM | POA: Diagnosis not present

## 2019-12-15 DIAGNOSIS — M6281 Muscle weakness (generalized): Secondary | ICD-10-CM | POA: Diagnosis not present

## 2019-12-20 DIAGNOSIS — M6281 Muscle weakness (generalized): Secondary | ICD-10-CM | POA: Diagnosis not present

## 2019-12-20 DIAGNOSIS — Z96651 Presence of right artificial knee joint: Secondary | ICD-10-CM | POA: Diagnosis not present

## 2019-12-28 DIAGNOSIS — M6281 Muscle weakness (generalized): Secondary | ICD-10-CM | POA: Diagnosis not present

## 2019-12-28 DIAGNOSIS — Z96651 Presence of right artificial knee joint: Secondary | ICD-10-CM | POA: Diagnosis not present

## 2019-12-30 DIAGNOSIS — Z01812 Encounter for preprocedural laboratory examination: Secondary | ICD-10-CM | POA: Diagnosis not present

## 2019-12-30 DIAGNOSIS — M6281 Muscle weakness (generalized): Secondary | ICD-10-CM | POA: Diagnosis not present

## 2019-12-30 DIAGNOSIS — M25562 Pain in left knee: Secondary | ICD-10-CM | POA: Diagnosis not present

## 2019-12-30 DIAGNOSIS — Z96651 Presence of right artificial knee joint: Secondary | ICD-10-CM | POA: Diagnosis not present

## 2019-12-30 DIAGNOSIS — M1712 Unilateral primary osteoarthritis, left knee: Secondary | ICD-10-CM | POA: Diagnosis not present

## 2020-01-04 DIAGNOSIS — Z96651 Presence of right artificial knee joint: Secondary | ICD-10-CM | POA: Diagnosis not present

## 2020-01-04 DIAGNOSIS — M1711 Unilateral primary osteoarthritis, right knee: Secondary | ICD-10-CM | POA: Diagnosis not present

## 2020-01-04 DIAGNOSIS — M6281 Muscle weakness (generalized): Secondary | ICD-10-CM | POA: Diagnosis not present

## 2020-01-05 ENCOUNTER — Ambulatory Visit (INDEPENDENT_AMBULATORY_CARE_PROVIDER_SITE_OTHER): Payer: Medicare HMO | Admitting: Internal Medicine

## 2020-01-05 ENCOUNTER — Encounter: Payer: Self-pay | Admitting: Internal Medicine

## 2020-01-05 ENCOUNTER — Telehealth: Payer: Self-pay | Admitting: Internal Medicine

## 2020-01-05 ENCOUNTER — Other Ambulatory Visit: Payer: Self-pay

## 2020-01-05 DIAGNOSIS — Z01810 Encounter for preprocedural cardiovascular examination: Secondary | ICD-10-CM

## 2020-01-05 NOTE — Progress Notes (Signed)
   Subjective:   Patient ID: Kayla Key, female    DOB: 1945-12-04, 74 y.o.   MRN: [id number]  HPI The patient is a 74 YO female coming in for surgical clearance. Previously had knee replacement and is getting other knee done in near future. Denies chest pains or SOB. Denies new abdominal problems. Still some stiffness in the knee which was replaced. She is scheduled for next week. Denies headaches or migraines. Taking medications as prescribed without side effects.   PMH, [facility], social history reviewed and updated  Review of Systems  Constitutional: Negative.   HENT: Negative.   Eyes: Negative.   Respiratory: Negative for cough, chest tightness and shortness of breath.   Cardiovascular: Negative for chest pain, palpitations and leg swelling.  Gastrointestinal: Negative for abdominal distention, abdominal pain, constipation, diarrhea, nausea and vomiting.  Musculoskeletal: Positive for arthralgias.  Skin: Negative.   Neurological: Negative.   Psychiatric/Behavioral: Negative.     Objective:  Physical Exam Constitutional:      Appearance: She is well-developed and well-nourished.  HENT:     Head: Normocephalic and atraumatic.  Eyes:     Extraocular Movements: EOM normal.  Cardiovascular:     Rate and Rhythm: Normal rate and regular rhythm.  Pulmonary:     Effort: Pulmonary effort is normal. No respiratory distress.     Breath sounds: Normal breath sounds. No wheezing or rales.  Abdominal:     General: Bowel sounds are normal. There is no distension.     Palpations: Abdomen is soft.     Tenderness: There is no abdominal tenderness. There is no rebound.  Musculoskeletal:        General: No edema.     Cervical back: Normal range of motion.  Skin:    General: Skin is warm and dry.  Neurological:     Mental Status: She is alert and oriented to person, place, and time.     Coordination: Coordination normal.  Psychiatric:        Mood and Affect: Mood and affect normal.      Vitals:   01/05/20 0907  BP: 122/76  Pulse: 95  Resp: 18  SpO2: 95%  Weight: 139 lb 3.2 oz (63.1 kg)  Height: 5\' 2"  (1.575 m)    This visit occurred during the SARS-CoV-2 public health emergency.  Safety protocols were in place, including screening questions prior to the visit, additional usage of staff PPE, and extensive cleaning of exam room while observing appropriate contact time as indicated for disinfecting solutions.   Assessment & Plan:

## 2020-01-05 NOTE — Telephone Encounter (Signed)
Lurena Joiner from [facility] called  They need the most recent labs and office notes faxed to their office please.   The best fax number is [phone]

## 2020-01-05 NOTE — Patient Instructions (Signed)
You are cleared to do the surgery.

## 2020-01-07 NOTE — Telephone Encounter (Signed)
Lurena Joiner from [facility] called to state that she did not receive labs.  Everything requested from previous fax refaxed to [phone]

## 2020-01-07 NOTE — Assessment & Plan Note (Signed)
Reviewed recent EKG which is normal. BP is normal. Would recommend to proceed without additional testing as patient is low risk for complications.

## 2020-01-07 NOTE — Telephone Encounter (Signed)
OV notes from 01/05/20 & most recent lab results from 09/29/19 faxed to number requested.

## 2020-02-28 NOTE — Telephone Encounter (Signed)
See below

## 2020-02-28 NOTE — Telephone Encounter (Signed)
I'm not sure what is needed from me. I filled out form sent to me. I see that ortho has ordered labs to be done at a lab but we cannot do labs for outside providers so they should advise patient of which lab they want her to go if they have labs that need done.

## 2020-02-28 NOTE — Telephone Encounter (Signed)
Kayla Key with [facility] called, [name] has pushed her surgery back to 2.25.22.   They need a new surgical clearance form, new labs, and new office notes  [facility] will not accept what has already been submitted.  Please fax information to Kayla Key at [phone]

## 2020-03-03 NOTE — Telephone Encounter (Signed)
LVM asking Kayla Key to return my call in regards to this matter.

## 2020-03-07 ENCOUNTER — Other Ambulatory Visit: Payer: Self-pay

## 2020-03-07 ENCOUNTER — Encounter: Payer: Self-pay | Admitting: Internal Medicine

## 2020-03-07 ENCOUNTER — Ambulatory Visit (INDEPENDENT_AMBULATORY_CARE_PROVIDER_SITE_OTHER): Payer: Medicare HMO | Admitting: Internal Medicine

## 2020-03-07 DIAGNOSIS — Z01810 Encounter for preprocedural cardiovascular examination: Secondary | ICD-10-CM | POA: Diagnosis not present

## 2020-03-07 NOTE — Patient Instructions (Signed)
You are cleared for the surgery.    

## 2020-03-07 NOTE — Progress Notes (Signed)
   Subjective:   Patient ID: Kayla Key, female    DOB: Aug 06, 1945, 75 y.o.   MRN: [id number]  HPI The patient is a 75 YO female coming in for pre-op clearance. She was recently seen and cleared twice but she has delayed the surgery. She is now having more pain and needing cane to get around so she would like to proceed. Denies chest pains or SOB. Is non-smoker. Denies stomach pain, nausea, vomiting, diarrhea, constipation. Has orders for labs to take to [facility] for surgeon.  PMH, [facility], social history reviewed and updated  Review of Systems  Constitutional: Negative.   HENT: Negative.   Eyes: Negative.   Respiratory: Negative for cough, chest tightness and shortness of breath.   Cardiovascular: Negative for chest pain, palpitations and leg swelling.  Gastrointestinal: Negative for abdominal distention, abdominal pain, constipation, diarrhea, nausea and vomiting.  Musculoskeletal: Positive for arthralgias and gait problem.  Skin: Negative.   Psychiatric/Behavioral: Negative.     Objective:  Physical Exam Constitutional:      Appearance: She is well-developed and well-nourished.  HENT:     Head: Normocephalic and atraumatic.  Eyes:     Extraocular Movements: EOM normal.  Cardiovascular:     Rate and Rhythm: Normal rate and regular rhythm.  Pulmonary:     Effort: Pulmonary effort is normal. No respiratory distress.     Breath sounds: Normal breath sounds. No wheezing or rales.  Abdominal:     General: Bowel sounds are normal. There is no distension.     Palpations: Abdomen is soft.     Tenderness: There is no abdominal tenderness. There is no rebound.  Musculoskeletal:        General: Tenderness present. No edema.     Cervical back: Normal range of motion.  Skin:    General: Skin is warm and dry.  Neurological:     Mental Status: She is alert and oriented to person, place, and time.     Coordination: Coordination abnormal.     Comments: Cane for ambulation   Psychiatric:        Mood and Affect: Mood and affect normal.     Vitals:   03/07/20 1552  BP: 122/70  Pulse: 94  Resp: 18  Temp: 98.5 F (36.9 C)  TempSrc: Oral  SpO2: 94%  Weight: 139 lb 3.2 oz (63.1 kg)  Height: 5\' 2"  (1.575 m)    This visit occurred during the SARS-CoV-2 public health emergency.  Safety protocols were in place, including screening questions prior to the visit, additional usage of staff PPE, and extensive cleaning of exam room while observing appropriate contact time as indicated for disinfecting solutions.   Assessment & Plan:

## 2020-03-08 NOTE — Assessment & Plan Note (Signed)
Patient is low risk for upcoming surgical procedure and should proceed without further testing. BP at goal and renal function stable. Forms signed and faxed today.

## 2020-03-09 DIAGNOSIS — Z01812 Encounter for preprocedural laboratory examination: Secondary | ICD-10-CM | POA: Diagnosis not present

## 2020-03-13 ENCOUNTER — Telehealth: Payer: Self-pay | Admitting: Internal Medicine

## 2020-03-13 NOTE — Telephone Encounter (Signed)
Kayla Key w/ [facility] is requesting that the appointment notes from 2.8.22 be faxed to their office at [phone]. she said that it can be attention to [facility]. She said that they got the labs and the surg clearance but not the office notes. Please advise.

## 2020-03-13 NOTE — Telephone Encounter (Signed)
Paperwork has been faxed to [facility] with attention to [place]. Confirmation fax has been received

## 2020-03-15 DIAGNOSIS — M1712 Unilateral primary osteoarthritis, left knee: Secondary | ICD-10-CM | POA: Diagnosis not present

## 2020-03-24 DIAGNOSIS — M1712 Unilateral primary osteoarthritis, left knee: Secondary | ICD-10-CM | POA: Diagnosis not present

## 2020-03-24 DIAGNOSIS — G8918 Other acute postprocedural pain: Secondary | ICD-10-CM | POA: Diagnosis not present

## 2020-03-24 DIAGNOSIS — M21162 Varus deformity, not elsewhere classified, left knee: Secondary | ICD-10-CM | POA: Diagnosis not present

## 2020-03-26 DIAGNOSIS — Z7982 Long term (current) use of aspirin: Secondary | ICD-10-CM | POA: Diagnosis not present

## 2020-03-26 DIAGNOSIS — I10 Essential (primary) hypertension: Secondary | ICD-10-CM | POA: Diagnosis not present

## 2020-03-26 DIAGNOSIS — Z471 Aftercare following joint replacement surgery: Secondary | ICD-10-CM | POA: Diagnosis not present

## 2020-03-26 DIAGNOSIS — Z96652 Presence of left artificial knee joint: Secondary | ICD-10-CM | POA: Diagnosis not present

## 2020-03-28 DIAGNOSIS — Z471 Aftercare following joint replacement surgery: Secondary | ICD-10-CM | POA: Diagnosis not present

## 2020-03-28 DIAGNOSIS — Z96652 Presence of left artificial knee joint: Secondary | ICD-10-CM | POA: Diagnosis not present

## 2020-03-28 DIAGNOSIS — I10 Essential (primary) hypertension: Secondary | ICD-10-CM | POA: Diagnosis not present

## 2020-03-28 DIAGNOSIS — Z7982 Long term (current) use of aspirin: Secondary | ICD-10-CM | POA: Diagnosis not present

## 2020-03-30 DIAGNOSIS — Z471 Aftercare following joint replacement surgery: Secondary | ICD-10-CM | POA: Diagnosis not present

## 2020-03-30 DIAGNOSIS — Z7982 Long term (current) use of aspirin: Secondary | ICD-10-CM | POA: Diagnosis not present

## 2020-03-30 DIAGNOSIS — I10 Essential (primary) hypertension: Secondary | ICD-10-CM | POA: Diagnosis not present

## 2020-03-30 DIAGNOSIS — Z96652 Presence of left artificial knee joint: Secondary | ICD-10-CM | POA: Diagnosis not present

## 2020-03-31 DIAGNOSIS — Z96652 Presence of left artificial knee joint: Secondary | ICD-10-CM | POA: Diagnosis not present

## 2020-03-31 DIAGNOSIS — Z7982 Long term (current) use of aspirin: Secondary | ICD-10-CM | POA: Diagnosis not present

## 2020-03-31 DIAGNOSIS — Z471 Aftercare following joint replacement surgery: Secondary | ICD-10-CM | POA: Diagnosis not present

## 2020-03-31 DIAGNOSIS — I10 Essential (primary) hypertension: Secondary | ICD-10-CM | POA: Diagnosis not present

## 2020-04-03 DIAGNOSIS — Z96652 Presence of left artificial knee joint: Secondary | ICD-10-CM | POA: Diagnosis not present

## 2020-04-03 DIAGNOSIS — Z7982 Long term (current) use of aspirin: Secondary | ICD-10-CM | POA: Diagnosis not present

## 2020-04-03 DIAGNOSIS — Z471 Aftercare following joint replacement surgery: Secondary | ICD-10-CM | POA: Diagnosis not present

## 2020-04-03 DIAGNOSIS — I10 Essential (primary) hypertension: Secondary | ICD-10-CM | POA: Diagnosis not present

## 2020-04-04 DIAGNOSIS — M6281 Muscle weakness (generalized): Secondary | ICD-10-CM | POA: Diagnosis not present

## 2020-04-04 DIAGNOSIS — Z471 Aftercare following joint replacement surgery: Secondary | ICD-10-CM | POA: Diagnosis not present

## 2020-04-04 DIAGNOSIS — M25662 Stiffness of left knee, not elsewhere classified: Secondary | ICD-10-CM | POA: Diagnosis not present

## 2020-04-04 DIAGNOSIS — Z96652 Presence of left artificial knee joint: Secondary | ICD-10-CM | POA: Diagnosis not present

## 2020-04-06 DIAGNOSIS — Z96652 Presence of left artificial knee joint: Secondary | ICD-10-CM | POA: Diagnosis not present

## 2020-04-06 DIAGNOSIS — M6281 Muscle weakness (generalized): Secondary | ICD-10-CM | POA: Diagnosis not present

## 2020-04-06 DIAGNOSIS — M25662 Stiffness of left knee, not elsewhere classified: Secondary | ICD-10-CM | POA: Diagnosis not present

## 2020-04-12 DIAGNOSIS — M25662 Stiffness of left knee, not elsewhere classified: Secondary | ICD-10-CM | POA: Diagnosis not present

## 2020-04-12 DIAGNOSIS — M6281 Muscle weakness (generalized): Secondary | ICD-10-CM | POA: Diagnosis not present

## 2020-04-12 DIAGNOSIS — Z96652 Presence of left artificial knee joint: Secondary | ICD-10-CM | POA: Diagnosis not present

## 2020-04-14 ENCOUNTER — Other Ambulatory Visit: Payer: Self-pay

## 2020-04-14 ENCOUNTER — Other Ambulatory Visit: Payer: Self-pay | Admitting: Internal Medicine

## 2020-04-14 ENCOUNTER — Ambulatory Visit (INDEPENDENT_AMBULATORY_CARE_PROVIDER_SITE_OTHER): Payer: Medicare HMO

## 2020-04-14 VITALS — BP 124/80 | HR 66 | Temp 98.0°F | Ht 62.0 in | Wt 135.8 lb

## 2020-04-14 DIAGNOSIS — Z0000 Encounter for general adult medical examination without abnormal findings: Secondary | ICD-10-CM | POA: Diagnosis not present

## 2020-04-14 NOTE — Progress Notes (Signed)
Subjective:   DEZIRAE SERVICE is a 75 y.o. female who presents for Medicare Annual (Subsequent) preventive examination.  Review of Systems    No ROS. Medicare Wellness Visit. Additional risk factors are reflected in social history. Cardiac Risk Factors include: advanced age (>76men, >20 women);hypertension;family history of premature cardiovascular disease     Objective:    Today's Vitals   04/14/20 0857  BP: 124/80  Pulse: 66  Temp: 98 F (36.7 C)  SpO2: 99%  Weight: 135 lb 12.8 oz (61.6 kg)  Height: 5\' 2"  (1.575 m)   Body mass index is 24.84 kg/m.  Advanced Directives 04/14/2020 07/01/2018 10/29/2017 04/06/2014  Does Patient Have a Medical Advance Directive? No No No No  Would patient like information on creating a medical advance directive? No - Patient declined No - Patient declined Yes (ED - Information included in AVS) -    Current Medications (verified) Outpatient Encounter Medications as of 04/14/2020  Medication Sig  . hydrochlorothiazide (HYDRODIURIL) 25 MG tablet Take 1 tablet (25 mg total) by mouth daily.  04/16/2020 losartan (COZAAR) 50 MG tablet Take 1 tablet (50 mg total) by mouth daily.  . Multiple Vitamin (MULTI-VITAMIN DAILY PO) Take 1 tablet by mouth daily.   No facility-administered encounter medications on file as of 04/14/2020.    Allergies (verified) Patient has no known allergies.   History: Past Medical History:  Diagnosis Date  . Arthritis   . Asthma   . GERD (gastroesophageal reflux disease)   . Hypertension   . MHA (microangiopathic hemolytic anemia) (HCC)   . Osteoporosis    Past Surgical History:  Procedure Laterality Date  . BUNIONECTOMY Right   . KNEE ARTHROSCOPY Right 2009  . KNEE ARTHROSCOPY Left 2015  . TOE SURGERY Bilateral 2005  . TUBAL LIGATION  1988   Family History  Problem Relation Age of Onset  . Stroke Father   . Hypertension Sister   . Breast cancer Sister 81  . Cancer Other        breast   . Breast cancer Maternal  Aunt 85  . Breast cancer Cousin 40  . Colon cancer Neg Hx    Social History   Socioeconomic History  . Marital status: Single    Spouse name: Not on file  . Number of children: Not on file  . Years of education: Not on file  . Highest education level: Not on file  Occupational History  . Not on file  Tobacco Use  . Smoking status: Never Smoker  . Smokeless tobacco: Never Used  Vaping Use  . Vaping Use: Never used  Substance and Sexual Activity  . Alcohol use: No    Alcohol/week: 0.0 standard drinks  . Drug use: No  . Sexual activity: Not Currently  Other Topics Concern  . Not on file  Social History Narrative  . Not on file   Social Determinants of Health   Financial Resource Strain: Low Risk   . Difficulty of Paying Living Expenses: Not hard at all  Food Insecurity: No Food Insecurity  . Worried About 60 in the Last Year: Never true  . Ran Out of Food in the Last Year: Never true  Transportation Needs: No Transportation Needs  . Lack of Transportation (Medical): No  . Lack of Transportation (Non-Medical): No  Physical Activity: Insufficiently Active  . Days of Exercise per Week: 2 days  . Minutes of Exercise per Session: 60 min  Stress: No Stress Concern Present  .  Feeling of Stress : Not at all  Social Connections: Moderately Integrated  . Frequency of Communication with Friends and Family: More than three times a week  . Frequency of Social Gatherings with Friends and Family: Once a week  . Attends Religious Services: More than 4 times per year  . Active Member of Clubs or Organizations: No  . Attends BankerClub or Organization Meetings: More than 4 times per year  . Marital Status: Never married    Tobacco Counseling Counseling given: Not Answered   Clinical Intake:  Pre-visit preparation completed: Yes  Pain : No/denies pain     BMI - recorded: 24.84 Nutritional Status: BMI of 19-24  Normal Nutritional Risks: None Diabetes: No  How  often do you need to have someone help you when you read instructions, pamphlets, or other written materials from your doctor or pharmacy?: 1 - Never What is the last grade level you completed in school?: Master's Degree  Diabetic? no  Interpreter Needed?: No  Information entered by :: Susie CassetteShenika Hatfield, LPN.   Activities of Daily Living In your present state of health, do you have any difficulty performing the following activities: 04/14/2020 01/05/2020  Hearing? Y N  Comment right ear -  Vision? N N  Difficulty concentrating or making decisions? N N  Walking or climbing stairs? N N  Dressing or bathing? N N  Doing errands, shopping? N N  Preparing Food and eating ? N -  Using the Toilet? N -  In the past six months, have you accidently leaked urine? N -  Do you have problems with loss of bowel control? N -  Managing your Medications? N -  Managing your Finances? N -  Housekeeping or managing your Housekeeping? N -  Some recent data might be hidden    Patient Care Team: Myrlene Brokerrawford, Elizabeth A, MD as PCP - General (Internal Medicine)  Indicate any recent Medical Services you may have received from other than [facility] providers in the past year (date may be approximate).     Assessment:   This is a routine wellness examination for [name].  Hearing/Vision screen No exam data present  Dietary issues and exercise activities discussed: Current Exercise Habits: Home exercise routine, Type of exercise: Other - see comments (physical therapy 2 times a week for 45-60 mins), Time (Minutes): 60, Frequency (Times/Week): 2, Weekly Exercise (Minutes/Week): 120, Exercise limited by: orthopedic condition(s)  Goals    . Patient Stated     Maintain current health status.      Depression Screen PHQ 2/9 Scores 04/14/2020 01/05/2020 11/02/2018 10/29/2017 10/25/2016 10/19/2015 12/29/2012  PHQ - 2 Score 0 0 0 0 0 0 0    Fall Risk Fall Risk  04/14/2020 01/05/2020 11/02/2018 10/29/2017 10/25/2016  Falls  in the past year? 1 1 1  No No  Number falls in past yr: 1 0 0 - -  Injury with Fall? 1 0 0 - -  Risk for fall due to : No Fall Risks;History of fall(s);Orthopedic patient - - - -  Risk for fall due to: Comment right knee - - - -  Follow up Falls evaluation completed - - - -    FALL RISK PREVENTION PERTAINING TO THE HOME:  Any stairs in or around the home? No  If so, are there any without handrails? No  Home free of loose throw rugs in walkways, pet beds, electrical cords, etc? Yes  Adequate lighting in your home to reduce risk of falls? Yes   ASSISTIVE  DEVICES UTILIZED TO PREVENT FALLS:  Life alert? No  Use of a cane, walker or w/c? Yes  Grab bars in the bathroom? No  Shower chair or bench in shower? No  Elevated toilet seat or a handicapped toilet? No   TIMED UP AND GO:  Was the test performed? No .  Length of time to ambulate 10 feet: 0 sec.   Gait steady and fast with assistive device  Cognitive Function: Normal cognitive status assessed by direct observation by this Nurse Health Advisor. No abnormalities found.          Immunizations Immunization History  Administered Date(s) Administered  . Influenza, High Dose Seasonal PF 10/19/2015, 10/25/2016, 10/29/2017, 10/27/2018  . Influenza,inj,Quad PF,6+ Mos 10/01/2012, 10/13/2013, 10/17/2014, 09/29/2019  . Influenza-Unspecified 10/27/2018  . Moderna Sars-Covid-2 Vaccination 02/23/2019, 03/23/2019, 11/20/2019  . PPD Test 06/13/2010  . Pneumococcal Conjugate-13 10/13/2013  . Pneumococcal Polysaccharide-23 10/17/2014  . Td 06/17/2006    TDAP status: Due, Education has been provided regarding the importance of this vaccine. Advised may receive this vaccine at local pharmacy or Health Dept. Aware to provide a copy of the vaccination record if obtained from local pharmacy or Health Dept. Verbalized acceptance and understanding.  Flu Vaccine status: Up to date  Pneumococcal vaccine status: Up to date  Covid-19 vaccine  status: Completed vaccines  Qualifies for Shingles Vaccine? Yes   Zostavax completed No   Shingrix Completed?: No.    Education has been provided regarding the importance of this vaccine. Patient has been advised to call insurance company to determine out of pocket expense if they have not yet received this vaccine. Advised may also receive vaccine at local pharmacy or Health Dept. Verbalized acceptance and understanding.  Screening Tests Health Maintenance  Topic Date Due  . TETANUS/TDAP  06/16/2016  . MAMMOGRAM  09/07/2021  . COLONOSCOPY (Pts 45-72yrs Insurance coverage will need to be confirmed)  04/14/2024  . INFLUENZA VACCINE  Completed  . DEXA SCAN  Completed  . COVID-19 Vaccine  Completed  . Hepatitis C Screening  Completed  . PNA vac Low Risk Adult  Completed  . HPV VACCINES  Aged Out    Health Maintenance  Health Maintenance Due  Topic Date Due  . TETANUS/TDAP  06/16/2016    Colorectal cancer screening: Type of screening: Colonoscopy. Completed 04/15/2014. Repeat every 10 years  Mammogram status: Completed 09/08/2019. Repeat every year  Bone Density status: Completed 11/27/2016. Results reflect: Bone density results: OSTEOPENIA. Repeat every 2 years.  Lung Cancer Screening: (Low Dose CT Chest recommended if Age 46-80 years, 30 pack-year currently smoking OR have quit w/in 15years.) does not qualify.   Lung Cancer Screening Referral: no  Additional Screening:  Hepatitis C Screening: does qualify; Completed yes  Vision Screening: Recommended annual ophthalmology exams for early detection of glaucoma and other disorders of the eye. Is the patient up to date with their annual eye exam?  Yes  Who is the provider or what is the name of the office in which the patient attends annual eye exams? Dr. Ander Purpura at [facility] If pt is not established with a provider, would they like to be referred to a provider to establish care? No .   Dental Screening: Recommended  annual dental exams for proper oral hygiene  Community Resource Referral / Chronic Care Management: CRR required this visit?  No   CCM required this visit?  No      Plan:     I have personally reviewed  and noted the following in the patient's chart:   . Medical and social history . Use of alcohol, tobacco or illicit drugs  . Current medications and supplements . Functional ability and status . Nutritional status . Physical activity . Advanced directives . List of other physicians . Hospitalizations, surgeries, and ER visits in previous 12 months . Vitals . Screenings to include cognitive, depression, and falls . Referrals and appointments  In addition, I have reviewed and discussed with patient certain preventive protocols, quality metrics, and best practice recommendations. A written personalized care plan for preventive services as well as general preventive health recommendations were provided to patient.     Mickeal Needy, LPN   [id number]/60/[id number]   Nurse Notes:  Medications reviewed with patient; no opioid use noted.

## 2020-04-14 NOTE — Patient Instructions (Signed)
Kayla Key , Thank you for taking time to come for your Medicare Wellness Visit. I appreciate your ongoing commitment to your health goals. Please review the following plan we discussed and let me know if I can assist you in the future.   Screening recommendations/referrals: Colonoscopy: 04/15/2014; due every 10 years Mammogram: 03/11/2019; due every 1-2 years Bone Density: 11/27/2016; due every 2 years Recommended yearly ophthalmology/optometry visit for glaucoma screening and checkup Recommended yearly dental visit for hygiene and checkup  Vaccinations: Influenza vaccine: 09/29/2019 Pneumococcal vaccine: 10/13/2013, 10/17/2014 Tdap vaccine: overdue; can check with local pharmacy for vaccine Shingles vaccine: never done; can check with local pharmacy for vaccine   Covid-19: Moderna 02/23/2019, 03/23/2019, 11/20/2019  Advanced directives: Advance directive discussed with you today. Even though you declined this today please call our office should you change your mind and we can give you the proper paperwork for you to fill out.  Conditions/risks identified: Yes; Reviewed health maintenance screenings with patient today and relevant education, vaccines, and/or referrals were provided. Please continue to do your personal lifestyle choices by: daily care of teeth and gums, regular physical activity (goal should be 5 days a week for 30 minutes), eat a healthy diet, avoid tobacco and drug use, limiting any alcohol intake, taking a low-dose aspirin (if not allergic or have been advised by your provider otherwise) and taking vitamins and minerals as recommended by your provider. Continue doing brain stimulating activities (puzzles, reading, adult coloring books, staying active) to keep memory sharp. Continue to eat heart healthy diet (full of fruits, vegetables, whole grains, lean protein, water--limit salt, fat, and sugar intake) and increase physical activity as tolerated.  Next appointment: Please schedule  your next Medicare Wellness Visit with your Nurse Health Advisor in 1 year by calling [phone].   Preventive Care 74 Years and Older, Female Preventive care refers to lifestyle choices and visits with your health care provider that can promote health and wellness. What does preventive care include?  A yearly physical exam. This is also called an annual well check.  Dental exams once or twice a year.  Routine eye exams. Ask your health care provider how often you should have your eyes checked.  Personal lifestyle choices, including:  Daily care of your teeth and gums.  Regular physical activity.  Eating a healthy diet.  Avoiding tobacco and drug use.  Limiting alcohol use.  Practicing safe sex.  Taking low-dose aspirin every day.  Taking vitamin and mineral supplements as recommended by your health care provider. What happens during an annual well check? The services and screenings done by your health care provider during your annual well check will depend on your age, overall health, lifestyle risk factors, and family history of disease. Counseling  Your health care provider may ask you questions about your:  Alcohol use.  Tobacco use.  Drug use.  Emotional well-being.  Home and relationship well-being.  Sexual activity.  Eating habits.  History of falls.  Memory and ability to understand (cognition).  Work and work Astronomer.  Reproductive health. Screening  You may have the following tests or measurements:  Height, weight, and BMI.  Blood pressure.  Lipid and cholesterol levels. These may be checked every 5 years, or more frequently if you are over 71 years old.  Skin check.  Lung cancer screening. You may have this screening every year starting at age 2 if you have a 30-pack-year history of smoking and currently smoke or have quit within the past 15 years.  Fecal occult blood test (FOBT) of the stool. You may have this test every year  starting at age 55.  Flexible sigmoidoscopy or colonoscopy. You may have a sigmoidoscopy every 5 years or a colonoscopy every 10 years starting at age 21.  Hepatitis C blood test.  Hepatitis B blood test.  Sexually transmitted disease (STD) testing.  Diabetes screening. This is done by checking your blood sugar (glucose) after you have not eaten for a while (fasting). You may have this done every 1-3 years.  Bone density scan. This is done to screen for osteoporosis. You may have this done starting at age 31.  Mammogram. This may be done every 1-2 years. Talk to your health care provider about how often you should have regular mammograms. Talk with your health care provider about your test results, treatment options, and if necessary, the need for more tests. Vaccines  Your health care provider may recommend certain vaccines, such as:  Influenza vaccine. This is recommended every year.  Tetanus, diphtheria, and acellular pertussis (Tdap, Td) vaccine. You may need a Td booster every 10 years.  Zoster vaccine. You may need this after age 82.  Pneumococcal 13-valent conjugate (PCV13) vaccine. One dose is recommended after age 87.  Pneumococcal polysaccharide (PPSV23) vaccine. One dose is recommended after age 21. Talk to your health care provider about which screenings and vaccines you need and how often you need them. This information is not intended to replace advice given to you by your health care provider. Make sure you discuss any questions you have with your health care provider. Document Released: 02/10/2015 Document Revised: 10/04/2015 Document Reviewed: 11/15/2014 Elsevier Interactive Patient Education  2017 [employer].  Fall Prevention in the Home Falls can cause injuries. They can happen to people of all ages. There are many things you can do to make your home safe and to help prevent falls. What can I do on the outside of my home?  Regularly fix the edges of walkways  and driveways and fix any cracks.  Remove anything that might make you trip as you walk through a door, such as a raised step or threshold.  Trim any bushes or trees on the path to your home.  Use bright outdoor lighting.  Clear any walking paths of anything that might make someone trip, such as rocks or tools.  Regularly check to see if handrails are loose or broken. Make sure that both sides of any steps have handrails.  Any raised decks and porches should have guardrails on the edges.  Have any leaves, snow, or ice cleared regularly.  Use sand or salt on walking paths during winter.  Clean up any spills in your garage right away. This includes oil or grease spills. What can I do in the bathroom?  Use night lights.  Install grab bars by the toilet and in the tub and shower. Do not use towel bars as grab bars.  Use non-skid mats or decals in the tub or shower.  If you need to sit down in the shower, use a plastic, non-slip stool.  Keep the floor dry. Clean up any water that spills on the floor as soon as it happens.  Remove soap buildup in the tub or shower regularly.  Attach bath mats securely with double-sided non-slip rug tape.  Do not have throw rugs and other things on the floor that can make you trip. What can I do in the bedroom?  Use night lights.  Make sure that  you have a light by your bed that is easy to reach.  Do not use any sheets or blankets that are too big for your bed. They should not hang down onto the floor.  Have a firm chair that has side arms. You can use this for support while you get dressed.  Do not have throw rugs and other things on the floor that can make you trip. What can I do in the kitchen?  Clean up any spills right away.  Avoid walking on wet floors.  Keep items that you use a lot in easy-to-reach places.  If you need to reach something above you, use a strong step stool that has a grab bar.  Keep electrical cords out of the  way.  Do not use floor polish or wax that makes floors slippery. If you must use wax, use non-skid floor wax.  Do not have throw rugs and other things on the floor that can make you trip. What can I do with my stairs?  Do not leave any items on the stairs.  Make sure that there are handrails on both sides of the stairs and use them. Fix handrails that are broken or loose. Make sure that handrails are as long as the stairways.  Check any carpeting to make sure that it is firmly attached to the stairs. Fix any carpet that is loose or worn.  Avoid having throw rugs at the top or bottom of the stairs. If you do have throw rugs, attach them to the floor with carpet tape.  Make sure that you have a light switch at the top of the stairs and the bottom of the stairs. If you do not have them, ask someone to add them for you. What else can I do to help prevent falls?  Wear shoes that:  Do not have high heels.  Have rubber bottoms.  Are comfortable and fit you well.  Are closed at the toe. Do not wear sandals.  If you use a stepladder:  Make sure that it is fully opened. Do not climb a closed stepladder.  Make sure that both sides of the stepladder are locked into place.  Ask someone to hold it for you, if possible.  Clearly mark and make sure that you can see:  Any grab bars or handrails.  First and last steps.  Where the edge of each step is.  Use tools that help you move around (mobility aids) if they are needed. These include:  Canes.  Walkers.  Scooters.  Crutches.  Turn on the lights when you go into a dark area. Replace any light bulbs as soon as they burn out.  Set up your furniture so you have a clear path. Avoid moving your furniture around.  If any of your floors are uneven, fix them.  If there are any pets around you, be aware of where they are.  Review your medicines with your doctor. Some medicines can make you feel dizzy. This can increase your chance  of falling. Ask your doctor what other things that you can do to help prevent falls. This information is not intended to replace advice given to you by your health care provider. Make sure you discuss any questions you have with your health care provider. Document Released: 11/10/2008 Document Revised: 06/22/2015 Document Reviewed: 02/18/2014 Elsevier Interactive Patient Education  2017 [employer].

## 2020-04-14 NOTE — Telephone Encounter (Signed)
Can you call pharmacy and see if they have other dosages of losartan? If so which do they have?

## 2020-04-18 DIAGNOSIS — M25662 Stiffness of left knee, not elsewhere classified: Secondary | ICD-10-CM | POA: Diagnosis not present

## 2020-04-18 DIAGNOSIS — Z96652 Presence of left artificial knee joint: Secondary | ICD-10-CM | POA: Diagnosis not present

## 2020-04-18 DIAGNOSIS — M6281 Muscle weakness (generalized): Secondary | ICD-10-CM | POA: Diagnosis not present

## 2020-04-19 NOTE — Telephone Encounter (Signed)
Can you please follow up with this and let me know which dosages are available?

## 2020-04-20 DIAGNOSIS — Z96652 Presence of left artificial knee joint: Secondary | ICD-10-CM | POA: Diagnosis not present

## 2020-04-20 DIAGNOSIS — M25662 Stiffness of left knee, not elsewhere classified: Secondary | ICD-10-CM | POA: Diagnosis not present

## 2020-04-20 DIAGNOSIS — M6281 Muscle weakness (generalized): Secondary | ICD-10-CM | POA: Diagnosis not present

## 2020-04-20 NOTE — Telephone Encounter (Signed)
Sent in valsartan instead. She should come for BP recheck about 1 month after making the switch.

## 2020-04-25 DIAGNOSIS — M6281 Muscle weakness (generalized): Secondary | ICD-10-CM | POA: Diagnosis not present

## 2020-04-25 DIAGNOSIS — M25662 Stiffness of left knee, not elsewhere classified: Secondary | ICD-10-CM | POA: Diagnosis not present

## 2020-04-25 DIAGNOSIS — Z96652 Presence of left artificial knee joint: Secondary | ICD-10-CM | POA: Diagnosis not present

## 2020-04-27 DIAGNOSIS — M25662 Stiffness of left knee, not elsewhere classified: Secondary | ICD-10-CM | POA: Diagnosis not present

## 2020-04-27 DIAGNOSIS — M6281 Muscle weakness (generalized): Secondary | ICD-10-CM | POA: Diagnosis not present

## 2020-04-27 DIAGNOSIS — Z96652 Presence of left artificial knee joint: Secondary | ICD-10-CM | POA: Diagnosis not present

## 2020-05-02 DIAGNOSIS — M6281 Muscle weakness (generalized): Secondary | ICD-10-CM | POA: Diagnosis not present

## 2020-05-02 DIAGNOSIS — Z96652 Presence of left artificial knee joint: Secondary | ICD-10-CM | POA: Diagnosis not present

## 2020-05-02 DIAGNOSIS — M25662 Stiffness of left knee, not elsewhere classified: Secondary | ICD-10-CM | POA: Diagnosis not present

## 2020-05-10 DIAGNOSIS — M6281 Muscle weakness (generalized): Secondary | ICD-10-CM | POA: Diagnosis not present

## 2020-05-10 DIAGNOSIS — Z96652 Presence of left artificial knee joint: Secondary | ICD-10-CM | POA: Diagnosis not present

## 2020-05-10 DIAGNOSIS — M25662 Stiffness of left knee, not elsewhere classified: Secondary | ICD-10-CM | POA: Diagnosis not present

## 2020-05-11 ENCOUNTER — Encounter (HOSPITAL_BASED_OUTPATIENT_CLINIC_OR_DEPARTMENT_OTHER): Payer: Self-pay | Admitting: Emergency Medicine

## 2020-05-11 ENCOUNTER — Other Ambulatory Visit: Payer: Self-pay

## 2020-05-11 ENCOUNTER — Emergency Department (HOSPITAL_BASED_OUTPATIENT_CLINIC_OR_DEPARTMENT_OTHER)
Admission: EM | Admit: 2020-05-11 | Discharge: 2020-05-11 | Disposition: A | Discharge status: Home / Self Care | Payer: Medicare HMO | Attending: Emergency Medicine | Admitting: Emergency Medicine

## 2020-05-11 ENCOUNTER — Emergency Department (HOSPITAL_BASED_OUTPATIENT_CLINIC_OR_DEPARTMENT_OTHER): Payer: Medicare HMO

## 2020-05-11 DIAGNOSIS — M25662 Stiffness of left knee, not elsewhere classified: Secondary | ICD-10-CM | POA: Diagnosis not present

## 2020-05-11 DIAGNOSIS — J45909 Unspecified asthma, uncomplicated: Secondary | ICD-10-CM | POA: Insufficient documentation

## 2020-05-11 DIAGNOSIS — R600 Localized edema: Secondary | ICD-10-CM | POA: Diagnosis not present

## 2020-05-11 DIAGNOSIS — I10 Essential (primary) hypertension: Secondary | ICD-10-CM | POA: Diagnosis not present

## 2020-05-11 DIAGNOSIS — Z79899 Other long term (current) drug therapy: Secondary | ICD-10-CM | POA: Insufficient documentation

## 2020-05-11 DIAGNOSIS — M25562 Pain in left knee: Secondary | ICD-10-CM | POA: Diagnosis not present

## 2020-05-11 DIAGNOSIS — M79605 Pain in left leg: Secondary | ICD-10-CM | POA: Diagnosis not present

## 2020-05-11 DIAGNOSIS — M79662 Pain in left lower leg: Secondary | ICD-10-CM | POA: Diagnosis not present

## 2020-05-11 DIAGNOSIS — Z96652 Presence of left artificial knee joint: Secondary | ICD-10-CM | POA: Diagnosis not present

## 2020-05-11 DIAGNOSIS — M6281 Muscle weakness (generalized): Secondary | ICD-10-CM | POA: Diagnosis not present

## 2020-05-11 NOTE — Discharge Instructions (Signed)
Luckily your ultrasound was negative for a blood clot.  Please follow-up with your family doctor.  Sometimes if the concern persist they will reultrasound in a week or 2 to reconfirm.

## 2020-05-11 NOTE — ED Triage Notes (Signed)
L calf pain x 1 week. Concerned for DVT. Recent knee replacement.

## 2020-05-11 NOTE — ED Provider Notes (Signed)
[facility] EMERGENCY DEPT Provider Note   CSN: [id number] Arrival date & time: 05/11/20  1634     History Chief Complaint  Patient presents with  . Leg Pain    Kayla Key is a 75 y.o. female.  75 yo F with a chief complaint of left leg pain and swelling.  Was sent by her doctor to get a DVT study.  She denies history of prior DVT or PE.  Denies chest pain or trouble breathing.  Denies feeling like she may pass out.  She had a knee replacement on that side done a couple months ago.  Has been having some continued pain and difficulty ambulating without leg.  The history is provided by the patient.  Leg Pain Location:  Leg Time since incident:  2 weeks Injury: no   Leg location:  L leg Pain details:    Quality:  Aching   Radiates to:  Does not radiate   Severity:  Moderate   Onset quality:  Gradual   Duration:  2 weeks   Timing:  Constant   Progression:  Worsening Chronicity:  New Prior injury to area:  No Relieved by:  Nothing Worsened by:  Adduction, bearing weight, abduction and exercise Ineffective treatments:  None tried Associated symptoms: no fever        Past Medical History:  Diagnosis Date  . Arthritis   . Asthma   . GERD (gastroesophageal reflux disease)   . Hypertension   . MHA (microangiopathic hemolytic anemia) (HCC)   . Osteoporosis     Patient Active Problem List   Diagnosis Date Noted  . Pre-operative cardiovascular examination 07/21/2019  . Prediabetes 11/03/2018  . Degenerative arthritis of knee, bilateral 09/30/2017  . Chronic pain of both knees 09/18/2017  . Routine general medical examination at a health care facility 10/17/2014  . Reflux esophagitis 03/01/2013  . Essential hypertension 07/08/2006  . Osteopenia 07/08/2006    Past Surgical History:  Procedure Laterality Date  . BUNIONECTOMY Right   . KNEE ARTHROSCOPY Right 2009  . KNEE ARTHROSCOPY Left 2015  . TOE SURGERY Bilateral 2005  . TUBAL LIGATION   1988     OB History   No obstetric history on file.     Family History  Problem Relation Age of Onset  . Stroke Father   . Hypertension Sister   . Breast cancer Sister 15  . Cancer Other        breast   . Breast cancer Maternal Aunt 85  . Breast cancer Cousin 40  . Colon cancer Neg Hx     Social History   Tobacco Use  . Smoking status: Never Smoker  . Smokeless tobacco: Never Used  Vaping Use  . Vaping Use: Never used  Substance Use Topics  . Alcohol use: No    Alcohol/week: 0.0 standard drinks  . Drug use: No    Home Medications Prior to Admission medications   Medication Sig Start Date End Date Taking? Authorizing Provider  hydrochlorothiazide (HYDRODIURIL) 25 MG tablet Take 1 tablet (25 mg total) by mouth daily. 10/11/19   Myrlene Broker, MD  Multiple Vitamin (MULTI-VITAMIN DAILY PO) Take 1 tablet by mouth daily.    [provider]  valsartan (DIOVAN) 80 MG tablet Take 1 tablet (80 mg total) by mouth daily. 04/20/20   Myrlene Broker, MD    Allergies    Patient has no known allergies.  Review of Systems   Review of Systems  Constitutional: Negative  for chills and fever.  HENT: Negative for congestion and rhinorrhea.   Eyes: Negative for redness and visual disturbance.  Respiratory: Negative for shortness of breath and wheezing.   Cardiovascular: Positive for leg swelling. Negative for chest pain and palpitations.  Gastrointestinal: Negative for nausea and vomiting.  Genitourinary: Negative for dysuria and urgency.  Musculoskeletal: Positive for arthralgias and myalgias.  Skin: Negative for pallor and wound.  Neurological: Negative for dizziness and headaches.    Physical Exam Updated Vital Signs BP 129/89 (BP Location: Right Arm)   Pulse 84   Temp 98.9 F (37.2 C) (Oral)   Resp 17   Ht 5\' 2"  (1.575 m)   Wt 61.2 kg   SpO2 99%   BMI 24.69 kg/m   Physical Exam Vitals and nursing note reviewed.  Constitutional:      General:  She is not in acute distress.    Appearance: She is well-developed. She is not diaphoretic.  HENT:     Head: Normocephalic and atraumatic.  Eyes:     Pupils: Pupils are equal, round, and reactive to light.  Cardiovascular:     Rate and Rhythm: Normal rate and regular rhythm.     Heart sounds: No murmur heard. No friction rub. No gallop.   Pulmonary:     Effort: Pulmonary effort is normal.     Breath sounds: No wheezing or rales.  Abdominal:     General: There is no distension.     Palpations: Abdomen is soft.     Tenderness: There is no abdominal tenderness.  Musculoskeletal:        General: Swelling present. No tenderness.     Cervical back: Normal range of motion and neck supple.     Comments: Unilateral leg swelling on the left of above the knee.  Motor and sensation intact.  Skin:    General: Skin is warm and dry.  Neurological:     Mental Status: She is alert and oriented to person, place, and time.  Psychiatric:        Behavior: Behavior normal.     ED Results / Procedures / Treatments   Labs (all labs ordered are listed, but only abnormal results are displayed) Labs Reviewed - No data to display  EKG None  Radiology Venous Img Lower Unilateral Left  Result Date: 05/11/2020 CLINICAL DATA:  Left calf pain for week, recent total knee arthroplasty 03/24/2020 EXAM: Left LOWER EXTREMITY VENOUS DOPPLER ULTRASOUND TECHNIQUE: Gray-scale sonography with compression, as well as color and duplex ultrasound, were performed to evaluate the deep venous system(s) from the level of the common femoral vein through the popliteal and proximal calf veins. COMPARISON:  None. FINDINGS: VENOUS Normal compressibility of the common femoral, superficial femoral, and popliteal veins, as well as the visualized calf veins. Visualized portions of profunda femoral vein and great saphenous vein unremarkable. No filling defects to suggest DVT on grayscale or color Doppler imaging. Doppler waveforms  show normal direction of venous flow, normal respiratory plasticity and response to augmentation where appropriate. Limited views of the contralateral common femoral vein are unremarkable. OTHER Complex, heterogeneous collection seen within the medial popliteal fossa measuring approximately 2.9 x 1.0 x 1.4 cm in size. A separate anechoic fluid collection seen in the upper mid calf measuring 9.8 x 1.8 x 2.9 cm. Limitations: none IMPRESSION: No evidence of femoropopliteal DVT or DVT within the visualized calf veins. Complex fluid collection in the medial popliteal fossa could reflect a Baker's cyst or postoperative fluid collection  decompressing into the posterior recesses. Similarly, an elongated fluid collection in the upper mid calf may reflect postoperative hematoma or seroma. Sterility of these collections is not ascertained on imaging. Electronically Signed   By: Kreg Shropshire M.D.   On: 05/11/2020 20:51    Procedures Procedures   Medications Ordered in ED Medications - No data to display  ED Course  I have reviewed the triage vital signs and the nursing notes.  Pertinent labs & imaging results that were available during my care of the patient were reviewed by me and considered in my medical decision making (see chart for details).    MDM Rules/Calculators/A&P                          75 yo F with a chief complaint of left leg pain and swelling.  Going on for about a week.  Had knee surgery done about a couple months ago.  Exam concerning for DVT.  Will obtain a DVT study.  DVT study is negative.  Baker's cyst was noted though seems unlikely to cause the amount of edema that she has.  Discharge home.  9:50 PM:  I have discussed the diagnosis/risks/treatment options with the patient and believe the pt to be eligible for discharge home to follow-up with Ortho. We also discussed returning to the ED immediately if new or worsening sx occur. We discussed the sx which are most concerning (e.g.,  sudden worsening pain, fever, inability to tolerate by mouth) that necessitate immediate return. Medications administered to the patient during their visit and any new prescriptions provided to the patient are listed below.  Medications given during this visit Medications - No data to display   The patient appears reasonably screen and/or stabilized for discharge and I doubt any other medical condition or other [facility] requiring further screening, evaluation, or treatment in the ED at this time prior to discharge.   Final Clinical Impression(s) / ED Diagnoses Final diagnoses:  Pain of left lower extremity    Rx / DC Orders ED Discharge Orders    None       Melene Plan, DO 05/11/20 2150

## 2020-05-18 DIAGNOSIS — M6281 Muscle weakness (generalized): Secondary | ICD-10-CM | POA: Diagnosis not present

## 2020-05-18 DIAGNOSIS — M25662 Stiffness of left knee, not elsewhere classified: Secondary | ICD-10-CM | POA: Diagnosis not present

## 2020-05-18 DIAGNOSIS — Z96652 Presence of left artificial knee joint: Secondary | ICD-10-CM | POA: Diagnosis not present

## 2020-05-23 DIAGNOSIS — M25662 Stiffness of left knee, not elsewhere classified: Secondary | ICD-10-CM | POA: Diagnosis not present

## 2020-05-23 DIAGNOSIS — Z96652 Presence of left artificial knee joint: Secondary | ICD-10-CM | POA: Diagnosis not present

## 2020-05-23 DIAGNOSIS — M6281 Muscle weakness (generalized): Secondary | ICD-10-CM | POA: Diagnosis not present

## 2020-08-23 ENCOUNTER — Other Ambulatory Visit: Payer: Self-pay | Admitting: Internal Medicine

## 2020-08-23 DIAGNOSIS — Z1231 Encounter for screening mammogram for malignant neoplasm of breast: Secondary | ICD-10-CM

## 2020-08-25 ENCOUNTER — Telehealth (INDEPENDENT_AMBULATORY_CARE_PROVIDER_SITE_OTHER): Payer: Medicare HMO | Admitting: Internal Medicine

## 2020-08-25 ENCOUNTER — Encounter: Payer: Self-pay | Admitting: Internal Medicine

## 2020-08-25 ENCOUNTER — Telehealth: Payer: Self-pay

## 2020-08-25 DIAGNOSIS — U071 COVID-19: Secondary | ICD-10-CM | POA: Insufficient documentation

## 2020-08-25 MED ORDER — MOLNUPIRAVIR EUA 200MG CAPSULE: 4.0000 | ORAL_CAPSULE | Freq: Two times a day (BID) | ORAL | 0 refills | Status: AC

## 2020-08-25 NOTE — Assessment & Plan Note (Signed)
Rx molnupiravir 5 day course as she is within window for treatment and informed of CDC recommendations for quarantine.

## 2020-08-25 NOTE — Telephone Encounter (Signed)
Can overbook today this morning sometime for virtual please.

## 2020-08-25 NOTE — Telephone Encounter (Signed)
pt has stated that she has test POS for COVID on yesterday. Pt is requesting a rx for the anti-viral Paxlovid.  **Pt sxs are cough, scratchy throat, runny nose and nasal congestin.

## 2020-08-25 NOTE — Progress Notes (Signed)
Virtual Visit via Video Note  I connected with Kayla Key on 08/25/20 at 11:00 AM EDT by a video enabled telemedicine application and verified that I am speaking with the correct person using two identifiers.  The patient and the provider were at separate locations throughout the entire encounter. Patient location: home, Provider location: work   I discussed the limitations of evaluation and management by telemedicine and the availability of in person appointments. The patient expressed understanding and agreed to proceed. The patient and the provider were the only parties present for the visit unless noted in HPI below.  History of Present Illness: The patient is a 75 y.o. female with visit for covid-19 positive. Started yesterday with symptoms. Has some cough and sore throat. Got it from exposure at work. Denies SOB or fevers or chills.   Observations/Objective: Appearance: normal, breathing appears normal, casual grooming, abdomen does not appear distended, mental status is A and O times 3.   Assessment and Plan: See problem oriented charting  Follow Up Instructions: rx mlonupiravir, advised of cdc recommendations for quarantine timeline.  I discussed the assessment and treatment plan with the patient. The patient was provided an opportunity to ask questions and all were answered. The patient agreed with the plan and demonstrated an understanding of the instructions.   The patient was advised to call back or seek an in-person evaluation if the symptoms worsen or if the condition fails to improve as anticipated.  Myrlene Broker, MD

## 2020-08-25 NOTE — Telephone Encounter (Signed)
See below

## 2020-09-02 DIAGNOSIS — Z03818 Encounter for observation for suspected exposure to other biological agents ruled out: Secondary | ICD-10-CM | POA: Diagnosis not present

## 2020-09-04 ENCOUNTER — Other Ambulatory Visit: Payer: Self-pay | Admitting: Internal Medicine

## 2020-09-19 DIAGNOSIS — Z96651 Presence of right artificial knee joint: Secondary | ICD-10-CM | POA: Diagnosis not present

## 2020-09-19 DIAGNOSIS — Z96653 Presence of artificial knee joint, bilateral: Secondary | ICD-10-CM | POA: Diagnosis not present

## 2020-09-19 DIAGNOSIS — Z96652 Presence of left artificial knee joint: Secondary | ICD-10-CM | POA: Diagnosis not present

## 2020-09-19 DIAGNOSIS — Z471 Aftercare following joint replacement surgery: Secondary | ICD-10-CM | POA: Diagnosis not present

## 2020-10-17 ENCOUNTER — Other Ambulatory Visit: Payer: Self-pay

## 2020-10-17 ENCOUNTER — Ambulatory Visit
Admission: RE | Admit: 2020-10-17 | Discharge: 2020-10-17 | Disposition: A | Discharge status: Home / Self Care | Payer: Medicare HMO | Source: Ambulatory Visit | Attending: Internal Medicine | Admitting: Internal Medicine

## 2020-10-17 DIAGNOSIS — Z1231 Encounter for screening mammogram for malignant neoplasm of breast: Secondary | ICD-10-CM | POA: Diagnosis not present

## 2020-11-04 DIAGNOSIS — H524 Presbyopia: Secondary | ICD-10-CM | POA: Diagnosis not present

## 2020-11-10 ENCOUNTER — Other Ambulatory Visit: Payer: Self-pay

## 2020-11-10 ENCOUNTER — Ambulatory Visit (INDEPENDENT_AMBULATORY_CARE_PROVIDER_SITE_OTHER): Payer: Medicare HMO

## 2020-11-10 ENCOUNTER — Ambulatory Visit (INDEPENDENT_AMBULATORY_CARE_PROVIDER_SITE_OTHER): Payer: Medicare HMO | Admitting: Internal Medicine

## 2020-11-10 ENCOUNTER — Encounter: Payer: Self-pay | Admitting: Internal Medicine

## 2020-11-10 VITALS — BP 124/82 | HR 74 | Resp 18 | Ht 62.0 in | Wt 134.8 lb

## 2020-11-10 DIAGNOSIS — Z0000 Encounter for general adult medical examination without abnormal findings: Secondary | ICD-10-CM

## 2020-11-10 DIAGNOSIS — Z96659 Presence of unspecified artificial knee joint: Secondary | ICD-10-CM | POA: Diagnosis not present

## 2020-11-10 DIAGNOSIS — M25559 Pain in unspecified hip: Secondary | ICD-10-CM

## 2020-11-10 DIAGNOSIS — M545 Low back pain, unspecified: Secondary | ICD-10-CM | POA: Diagnosis not present

## 2020-11-10 DIAGNOSIS — M160 Bilateral primary osteoarthritis of hip: Secondary | ICD-10-CM | POA: Diagnosis not present

## 2020-11-10 DIAGNOSIS — T8484XA Pain due to internal orthopedic prosthetic devices, implants and grafts, initial encounter: Secondary | ICD-10-CM | POA: Diagnosis not present

## 2020-11-10 DIAGNOSIS — R7303 Prediabetes: Secondary | ICD-10-CM | POA: Diagnosis not present

## 2020-11-10 DIAGNOSIS — I10 Essential (primary) hypertension: Secondary | ICD-10-CM | POA: Diagnosis not present

## 2020-11-10 LAB — COMPREHENSIVE METABOLIC PANEL
ALT: 21 U/L (ref 0–35)
AST: 24 U/L (ref 0–37)
Albumin: 3.9 g/dL (ref 3.5–5.2)
Alkaline Phosphatase: 74 U/L (ref 39–117)
BUN: 20 mg/dL (ref 6–23)
CO2: 31 mEq/L (ref 19–32)
Calcium: 9.4 mg/dL (ref 8.4–10.5)
Chloride: 102 mEq/L (ref 96–112)
Creatinine, Ser: 0.79 mg/dL (ref 0.40–1.20)
GFR: 74.45 mL/min (ref >60.00)
Glucose, Bld: 68 mg/dL — ABNORMAL LOW (ref 70–99)
Potassium: 3.7 mEq/L (ref 3.5–5.1)
Sodium: 140 mEq/L (ref 135–145)
Total Bilirubin: 0.6 mg/dL (ref 0.2–1.2)
Total Protein: 7 g/dL (ref 6.0–8.3)

## 2020-11-10 LAB — CBC
HCT: 37.3 % (ref 36.0–46.0)
Hemoglobin: 11.8 g/dL — ABNORMAL LOW (ref 12.0–15.0)
MCHC: 31.5 g/dL (ref 30.0–36.0)
MCV: 82.5 fl (ref 78.0–100.0)
Platelets: 330 10*3/uL (ref 150.0–400.0)
RBC: 4.52 Mil/uL (ref 3.87–5.11)
RDW: 14.7 % (ref 11.5–15.5)
WBC: 7.5 10*3/uL (ref 4.0–10.5)

## 2020-11-10 LAB — LIPID PANEL
Cholesterol: 160 mg/dL (ref 0–200)
HDL: 70.7 mg/dL (ref >39.00)
LDL Cholesterol: 81 mg/dL (ref 0–99)
NonHDL: 89.43
Total CHOL/HDL Ratio: 2
Triglycerides: 43 mg/dL (ref 0.0–149.0)
VLDL: 8.6 mg/dL (ref 0.0–40.0)

## 2020-11-10 LAB — HEMOGLOBIN A1C: Hgb A1c MFr Bld: 6.2 % (ref 4.6–6.5)

## 2020-11-10 NOTE — Assessment & Plan Note (Signed)
Pain in both knees about 1 year out from knee replacement. She is still working on PT. Needs assessment of hips and low back for assessment today.

## 2020-11-10 NOTE — Progress Notes (Signed)
   Subjective:   Patient ID: Kayla Key, female    DOB: 05-Jul-1945, 75 y.o.   MRN: [id number]  HPI The patient is a 75 YO female coming in for physical. New hip and low back pain. S/P 2 knee replacements in the last year or so and still having pain with them.  PMH, [facility], social history reviewed and updated  Review of Systems  Constitutional: Negative.   HENT: Negative.    Eyes: Negative.   Respiratory:  Negative for cough, chest tightness and shortness of breath.   Cardiovascular:  Negative for chest pain, palpitations and leg swelling.  Gastrointestinal:  Negative for abdominal distention, abdominal pain, constipation, diarrhea, nausea and vomiting.  Musculoskeletal:  Positive for arthralgias and back pain.  Skin: Negative.   Neurological: Negative.   Psychiatric/Behavioral: Negative.     Objective:  Physical Exam Constitutional:      Appearance: She is well-developed.  HENT:     Head: Normocephalic and atraumatic.  Cardiovascular:     Rate and Rhythm: Normal rate and regular rhythm.  Pulmonary:     Effort: Pulmonary effort is normal. No respiratory distress.     Breath sounds: Normal breath sounds. No wheezing or rales.  Abdominal:     General: Bowel sounds are normal. There is no distension.     Palpations: Abdomen is soft.     Tenderness: There is no abdominal tenderness. There is no rebound.  Musculoskeletal:        General: Tenderness present.     Cervical back: Normal range of motion.  Skin:    General: Skin is warm and dry.  Neurological:     Mental Status: She is alert and oriented to person, place, and time.     Coordination: Coordination normal.    Vitals:   11/10/20 0943  BP: 124/82  Pulse: 74  Resp: 18  SpO2: 91%  Weight: 134 lb 12.8 oz (61.1 kg)  Height: 5\' 2"  (1.575 m)    This visit occurred during the SARS-CoV-2 public health emergency.  Safety protocols were in place, including screening questions prior to the visit, additional usage of  staff PPE, and extensive cleaning of exam room while observing appropriate contact time as indicated for disinfecting solutions.   Assessment & Plan:

## 2020-11-10 NOTE — Assessment & Plan Note (Signed)
Checking CBC and CMP and lipid panel today. BP at goal on valsartan 80 mg daily and hctz 25 mg daily. Adjust as needed.

## 2020-11-10 NOTE — Assessment & Plan Note (Signed)
Ordered x-ray bilateral hips today for assessment. Since she has had knee replacement bilateral in the last year she could have some problems s/p due to gait changes.

## 2020-11-10 NOTE — Assessment & Plan Note (Signed)
Flu shot complete at pharmacy. Covid-19 got newest booster. Pneumonia complete. Shingrix counseled to get at pharmacy. Tetanus due declines. Colonoscopy due 2026. Mammogram due 2024, pap smear aged out and dexa due 2024. Counseled about sun safety and mole surveillance. Counseled about the dangers of distracted driving. Given 10 year screening recommendations.

## 2020-11-10 NOTE — Assessment & Plan Note (Signed)
Checking HgA1c today and adjust as needed.  

## 2020-11-10 NOTE — Patient Instructions (Signed)
We will check the x-rays today for the hips and low back.

## 2020-11-10 NOTE — Assessment & Plan Note (Signed)
Ordered x-ray lumbar for assessment today.

## 2020-12-27 DIAGNOSIS — M25562 Pain in left knee: Secondary | ICD-10-CM | POA: Diagnosis not present

## 2020-12-27 DIAGNOSIS — M25561 Pain in right knee: Secondary | ICD-10-CM | POA: Diagnosis not present

## 2021-03-20 DIAGNOSIS — M25562 Pain in left knee: Secondary | ICD-10-CM | POA: Diagnosis not present

## 2021-03-20 DIAGNOSIS — M25561 Pain in right knee: Secondary | ICD-10-CM | POA: Diagnosis not present

## 2021-03-27 DIAGNOSIS — M6281 Muscle weakness (generalized): Secondary | ICD-10-CM | POA: Diagnosis not present

## 2021-03-27 DIAGNOSIS — M25662 Stiffness of left knee, not elsewhere classified: Secondary | ICD-10-CM | POA: Diagnosis not present

## 2021-03-27 DIAGNOSIS — M25562 Pain in left knee: Secondary | ICD-10-CM | POA: Diagnosis not present

## 2021-03-27 DIAGNOSIS — M25561 Pain in right knee: Secondary | ICD-10-CM | POA: Diagnosis not present

## 2021-03-29 DIAGNOSIS — M25562 Pain in left knee: Secondary | ICD-10-CM | POA: Diagnosis not present

## 2021-03-29 DIAGNOSIS — M25662 Stiffness of left knee, not elsewhere classified: Secondary | ICD-10-CM | POA: Diagnosis not present

## 2021-03-29 DIAGNOSIS — M6281 Muscle weakness (generalized): Secondary | ICD-10-CM | POA: Diagnosis not present

## 2021-03-29 DIAGNOSIS — M25561 Pain in right knee: Secondary | ICD-10-CM | POA: Diagnosis not present

## 2021-04-03 DIAGNOSIS — M25662 Stiffness of left knee, not elsewhere classified: Secondary | ICD-10-CM | POA: Diagnosis not present

## 2021-04-03 DIAGNOSIS — M6281 Muscle weakness (generalized): Secondary | ICD-10-CM | POA: Diagnosis not present

## 2021-04-03 DIAGNOSIS — M25562 Pain in left knee: Secondary | ICD-10-CM | POA: Diagnosis not present

## 2021-04-03 DIAGNOSIS — M25561 Pain in right knee: Secondary | ICD-10-CM | POA: Diagnosis not present

## 2021-04-10 DIAGNOSIS — M25662 Stiffness of left knee, not elsewhere classified: Secondary | ICD-10-CM | POA: Diagnosis not present

## 2021-04-10 DIAGNOSIS — M6281 Muscle weakness (generalized): Secondary | ICD-10-CM | POA: Diagnosis not present

## 2021-04-10 DIAGNOSIS — M25561 Pain in right knee: Secondary | ICD-10-CM | POA: Diagnosis not present

## 2021-04-10 DIAGNOSIS — M25562 Pain in left knee: Secondary | ICD-10-CM | POA: Diagnosis not present

## 2021-04-12 DIAGNOSIS — M25662 Stiffness of left knee, not elsewhere classified: Secondary | ICD-10-CM | POA: Diagnosis not present

## 2021-04-12 DIAGNOSIS — M25562 Pain in left knee: Secondary | ICD-10-CM | POA: Diagnosis not present

## 2021-04-12 DIAGNOSIS — M6281 Muscle weakness (generalized): Secondary | ICD-10-CM | POA: Diagnosis not present

## 2021-04-12 DIAGNOSIS — M25561 Pain in right knee: Secondary | ICD-10-CM | POA: Diagnosis not present

## 2021-04-14 ENCOUNTER — Other Ambulatory Visit: Payer: Self-pay | Admitting: Internal Medicine

## 2021-04-17 DIAGNOSIS — M25662 Stiffness of left knee, not elsewhere classified: Secondary | ICD-10-CM | POA: Diagnosis not present

## 2021-04-17 DIAGNOSIS — M6281 Muscle weakness (generalized): Secondary | ICD-10-CM | POA: Diagnosis not present

## 2021-04-17 DIAGNOSIS — M25561 Pain in right knee: Secondary | ICD-10-CM | POA: Diagnosis not present

## 2021-04-17 DIAGNOSIS — M25562 Pain in left knee: Secondary | ICD-10-CM | POA: Diagnosis not present

## 2021-05-08 ENCOUNTER — Ambulatory Visit (INDEPENDENT_AMBULATORY_CARE_PROVIDER_SITE_OTHER): Payer: Medicare HMO

## 2021-05-08 VITALS — BP 105/68 | HR 84 | Temp 98.0°F | Ht 62.0 in | Wt 138.0 lb

## 2021-05-08 DIAGNOSIS — Z0000 Encounter for general adult medical examination without abnormal findings: Secondary | ICD-10-CM

## 2021-05-08 NOTE — Progress Notes (Signed)
? ?Subjective:  ? Kayla Key is a 76 y.o. female who presents for Medicare Annual (Subsequent) preventive examination. ? ?Review of Systems    ? ?Cardiac Risk Factors include: advanced age (>65men, >40 women);family history of premature cardiovascular disease;hypertension ? ?   ?Objective:  ?  ?Today's Vitals  ? 05/08/21 1256  ?BP: 105/68  ?Pulse: 84  ?Temp: 98 ?F (36.7 ?C)  ?SpO2: 99%  ?Weight: 138 lb (62.6 kg)  ?Height: 5\' 2"  (1.575 m)  ?PainSc: 7   ? ?Body mass index is 25.24 kg/m?. ? ? ?  05/08/2021  ?  1:39 PM 05/11/2020  ?  4:40 PM 04/14/2020  ?  9:29 AM 07/01/2018  ?  3:37 PM 10/29/2017  ? 12:48 PM 04/06/2014  ? 11:12 AM  ?Advanced Directives  ?Does Patient Have a Medical Advance Directive? No No No No No No  ?Would patient like information on creating a medical advance directive? No - Patient declined  No - Patient declined No - Patient declined Yes (ED - Information included in AVS)   ? ? ?Current Medications (verified) ?Outpatient Encounter Medications as of 05/08/2021  ?Medication Sig  ? hydrochlorothiazide (HYDRODIURIL) 25 MG tablet TAKE 1 TABLET BY MOUTH EVERY DAY  ? Multiple Vitamin (MULTI-VITAMIN DAILY PO) Take 1 tablet by mouth daily.  ? valsartan (DIOVAN) 80 MG tablet TAKE 1 TABLET BY MOUTH EVERY DAY  ? ?No facility-administered encounter medications on file as of 05/08/2021.  ? ? ?Allergies (verified) ?Patient has no known allergies.  ? ?History: ?Past Medical History:  ?Diagnosis Date  ? Arthritis   ? Asthma   ? GERD (gastroesophageal reflux disease)   ? Hypertension   ? MHA (microangiopathic hemolytic anemia) (HCC)   ? Osteoporosis   ? ?Past Surgical History:  ?Procedure Laterality Date  ? BUNIONECTOMY Right   ? KNEE ARTHROSCOPY Right 2009  ? KNEE ARTHROSCOPY Left 2015  ? TOE SURGERY Bilateral 2005  ? TUBAL LIGATION  1988  ? ?Family History  ?Problem Relation Age of Onset  ? Stroke Father   ? Hypertension Sister   ? Breast cancer Sister 10  ? Cancer Other   ?     breast   ? Breast cancer Maternal  Aunt 85  ? Breast cancer Cousin 29  ? Colon cancer Neg Hx   ? ?Social History  ? ?Socioeconomic History  ? Marital status: Single  ?  Spouse name: Not on file  ? Number of children: Not on file  ? Years of education: Not on file  ? Highest education level: Not on file  ?Occupational History  ? Not on file  ?Tobacco Use  ? Smoking status: Never  ? Smokeless tobacco: Never  ?Vaping Use  ? Vaping Use: Never used  ?Substance and Sexual Activity  ? Alcohol use: No  ?  Alcohol/week: 0.0 standard drinks  ? Drug use: No  ? Sexual activity: Not Currently  ?Other Topics Concern  ? Not on file  ?Social History Narrative  ? Not on file  ? ?Social Determinants of Health  ? ?Financial Resource Strain: Low Risk   ? Difficulty of Paying Living Expenses: Not hard at all  ?Food Insecurity: No Food Insecurity  ? Worried About Charity fundraiser in the Last Year: Never true  ? Ran Out of Food in the Last Year: Never true  ?Transportation Needs: No Transportation Needs  ? Lack of Transportation (Medical): No  ? Lack of Transportation (Non-Medical): No  ?Physical Activity: Not on  file  ?Stress: No Stress Concern Present  ? Feeling of Stress : Not at all  ?Social Connections: Moderately Integrated  ? Frequency of Communication with Friends and Family: More than three times a week  ? Frequency of Social Gatherings with Friends and Family: Once a week  ? Attends Religious Services: More than 4 times per year  ? Active Member of Clubs or Organizations: No  ? Attends Archivist Meetings: More than 4 times per year  ? Marital Status: Never married  ? ? ?Tobacco Counseling ?Counseling given: Not Answered ? ? ?Clinical Intake: ? ?Pre-visit preparation completed: Yes ? ?Pain : No/denies pain ?Pain Score: 7  ? ?  ? ?BMI - recorded: 25.24 ?Nutritional Status: BMI 25 -29 Overweight ?Nutritional Risks: None ?Diabetes: No ? ?How often do you need to have someone help you when you read instructions, pamphlets, or other written materials from  your doctor or pharmacy?: 1 - Never ?What is the last grade level you completed in school?: Master's Degree ? ?Diabetic? prediabetes ? ?Interpreter Needed?: No ? ?Information entered by :: Lisette Abu, LPN ? ? ?Activities of Daily Living ? ?  05/08/2021  ?  1:39 PM  ?In your present state of health, do you have any difficulty performing the following activities:  ?Hearing? 0  ?Vision? 0  ?Difficulty concentrating or making decisions? 0  ?Walking or climbing stairs? 0  ?Dressing or bathing? 0  ?Doing errands, shopping? 0  ?Preparing Food and eating ? N  ?Using the Toilet? N  ?In the past six months, have you accidently leaked urine? N  ?Do you have problems with loss of bowel control? N  ?Managing your Medications? N  ?Managing your Finances? N  ?Housekeeping or managing your Housekeeping? N  ? ? ?Patient Care Team: ?Hoyt Koch, MD as PCP - General (Internal Medicine) ? ?Indicate any recent Medical Services you may have received from other than [facility] providers in the past year (date may be approximate). ? ?   ?Assessment:  ? This is a routine wellness examination for Kayla Key. ? ?Hearing/Vision screen ?No results found. ? ?Dietary issues and exercise activities discussed: ?Current Exercise Habits: The patient has a physically strenuous job, but has no regular exercise apart from work., Exercise limited by: orthopedic condition(s) ? ? Goals Addressed   ? ?  ?  ?  ?  ? This Visit's Progress  ?  My goal is to get back to the gym and start being active again.     ? ?  ?Depression Screen ? ?  05/08/2021  ? 12:57 PM 04/14/2020  ?  9:30 AM 01/05/2020  ?  9:12 AM 11/02/2018  ?  2:31 PM 10/29/2017  ? 12:49 PM 10/25/2016  ?  8:06 AM 10/19/2015  ?  3:44 PM  ?PHQ 2/9 Scores  ?PHQ - 2 Score 0 0 0 0 0 0 0  ?  ?Fall Risk ? ?  05/08/2021  ?  1:39 PM 04/14/2020  ?  9:29 AM 01/05/2020  ?  9:12 AM 11/02/2018  ?  2:31 PM 10/29/2017  ? 12:49 PM  ?Fall Risk   ?Falls in the past year? 0 1 1 1  No  ?Number falls in past yr: 0 1 0 0    ?Injury with Fall? 0 1 0 0   ?Risk for fall due to : No Fall Risks No Fall Risks;History of fall(s);Orthopedic patient     ?Risk for fall due to: Comment  right knee     ?  Follow up Falls evaluation completed Falls evaluation completed     ? ? ?FALL RISK PREVENTION PERTAINING TO THE HOME: ? ?Any stairs in or around the home? No  ?If so, are there any without handrails? No  ?Home free of loose throw rugs in walkways, pet beds, electrical cords, etc? Yes  ?Adequate lighting in your home to reduce risk of falls? Yes  ? ?ASSISTIVE DEVICES UTILIZED TO PREVENT FALLS: ? ?Life alert? No  ?Use of a cane, walker or w/c? No  ?Grab bars in the bathroom? No  ?Shower chair or bench in shower? Yes  ?Elevated toilet seat or a handicapped toilet? Yes  ? ?TIMED UP AND GO: ? ?Was the test performed? Yes .  ?Length of time to ambulate 10 feet: 6 sec.  ? ?Gait steady and fast without use of assistive device ? ?Cognitive Function: ?  ?  ? ?  05/08/2021  ? 12:58 PM  ?6CIT Screen  ?What Year? 0 points  ?What month? 0 points  ?What time? 0 points  ?Count back from 20 0 points  ?Months in reverse 0 points  ?Repeat phrase 0 points  ?Total Score 0 points  ? ? ?Immunizations ?Immunization History  ?Administered Date(s) Administered  ? Influenza, High Dose Seasonal PF 10/19/2015, 10/25/2016, 10/29/2017, 10/27/2018  ? Influenza,inj,Quad PF,6+ Mos 10/01/2012, 10/13/2013, 10/17/2014, 09/29/2019  ? Influenza-Unspecified 10/27/2018  ? Moderna SARS-COV2 Booster Vaccination 05/01/2020  ? Moderna Sars-Covid-2 Vaccination 02/23/2019, 03/23/2019, 11/20/2019  ? PPD Test 06/13/2010  ? Pension scheme manager 81yrs & up 10/30/2020  ? Pneumococcal Conjugate-13 10/13/2013  ? Pneumococcal Polysaccharide-23 10/17/2014  ? Td 06/17/2006  ? ? ?TDAP status: Due, Education has been provided regarding the importance of this vaccine. Advised may receive this vaccine at local pharmacy or Health Dept. Aware to provide a copy of the vaccination record if  obtained from local pharmacy or Health Dept. Verbalized acceptance and understanding. ? ?Flu Vaccine status: Up to date ? ?Pneumococcal vaccine status: Up to date ? ?Covid-19 vaccine status: Completed vaccines

## 2021-05-08 NOTE — Patient Instructions (Signed)
Ms. Gallik , ?Thank you for taking time to come for your Medicare Wellness Visit. I appreciate your ongoing commitment to your health goals. Please review the following plan we discussed and let me know if I can assist you in the future.  ? ?Screening recommendations/referrals: ?Colonoscopy: 04/15/2014; due every 10 years ?Mammogram: 10/17/2020; due every year ?Bone Density: 11/27/2016; due every 2 years ?Recommended yearly ophthalmology/optometry visit for glaucoma screening and checkup ?Recommended yearly dental visit for hygiene and checkup ? ?Vaccinations: ?Influenza vaccine: 10/30/2020 ?Pneumococcal vaccine: 10/13/2013, 10/17/2014 ?Tdap vaccine: 06/17/2006; due every 10 years (overdue) ?Shingles vaccine: never done   ?Covid-19: 02/23/2019, 03/23/2019, 11/20/2019, 05/01/2020, 10/30/2020 ? ?Advanced directives: No ? ?Conditions/risks identified: Yes ? ?Next appointment: Please schedule your next Medicare Wellness Visit with your Nurse Health Advisor in 1 year by calling [phone]. ? ? ?Preventive Care 21 Years and Older, Female ?Preventive care refers to lifestyle choices and visits with your health care provider that can promote health and wellness. ?What does preventive care include? ?A yearly physical exam. This is also called an annual well check. ?Dental exams once or twice a year. ?Routine eye exams. Ask your health care provider how often you should have your eyes checked. ?Personal lifestyle choices, including: ?Daily care of your teeth and gums. ?Regular physical activity. ?Eating a healthy diet. ?Avoiding tobacco and drug use. ?Limiting alcohol use. ?Practicing safe sex. ?Taking low-dose aspirin every day. ?Taking vitamin and mineral supplements as recommended by your health care provider. ?What happens during an annual well check? ?The services and screenings done by your health care provider during your annual well check will depend on your age, overall health, lifestyle risk factors, and family history of  disease. ?Counseling  ?Your health care provider may ask you questions about your: ?Alcohol use. ?Tobacco use. ?Drug use. ?Emotional well-being. ?Home and relationship well-being. ?Sexual activity. ?Eating habits. ?History of falls. ?Memory and ability to understand (cognition). ?Work and work Statistician. ?Reproductive health. ?Screening  ?You may have the following tests or measurements: ?Height, weight, and BMI. ?Blood pressure. ?Lipid and cholesterol levels. These may be checked every 5 years, or more frequently if you are over 78 years old. ?Skin check. ?Lung cancer screening. You may have this screening every year starting at age 25 if you have a 30-pack-year history of smoking and currently smoke or have quit within the past 15 years. ?Fecal occult blood test (FOBT) of the stool. You may have this test every year starting at age 62. ?Flexible sigmoidoscopy or colonoscopy. You may have a sigmoidoscopy every 5 years or a colonoscopy every 10 years starting at age 73. ?Hepatitis C blood test. ?Hepatitis B blood test. ?Sexually transmitted disease (STD) testing. ?Diabetes screening. This is done by checking your blood sugar (glucose) after you have not eaten for a while (fasting). You may have this done every 1-3 years. ?Bone density scan. This is done to screen for osteoporosis. You may have this done starting at age 30. ?Mammogram. This may be done every 1-2 years. Talk to your health care provider about how often you should have regular mammograms. ?Talk with your health care provider about your test results, treatment options, and if necessary, the need for more tests. ?Vaccines  ?Your health care provider may recommend certain vaccines, such as: ?Influenza vaccine. This is recommended every year. ?Tetanus, diphtheria, and acellular pertussis (Tdap, Td) vaccine. You may need a Td booster every 10 years. ?Zoster vaccine. You may need this after age 70. ?Pneumococcal 13-valent conjugate (PCV13) vaccine.  One  dose is recommended after age 40. ?Pneumococcal polysaccharide (PPSV23) vaccine. One dose is recommended after age 35. ?Talk to your health care provider about which screenings and vaccines you need and how often you need them. ?This information is not intended to replace advice given to you by your health care provider. Make sure you discuss any questions you have with your health care provider. ?Document Released: 02/10/2015 Document Revised: 10/04/2015 Document Reviewed: 11/15/2014 ?Elsevier Interactive Patient Education ? 2017 [place]. ? ?Fall Prevention in the Home ?Falls can cause injuries. They can happen to people of all ages. There are many things you can do to make your home safe and to help prevent falls. ?What can I do on the outside of my home? ?Regularly fix the edges of walkways and driveways and fix any cracks. ?Remove anything that might make you trip as you walk through a door, such as a raised step or threshold. ?Trim any bushes or trees on the path to your home. ?Use bright outdoor lighting. ?Clear any walking paths of anything that might make someone trip, such as rocks or tools. ?Regularly check to see if handrails are loose or broken. Make sure that both sides of any steps have handrails. ?Any raised decks and porches should have guardrails on the edges. ?Have any leaves, snow, or ice cleared regularly. ?Use sand or salt on walking paths during winter. ?Clean up any spills in your garage right away. This includes oil or grease spills. ?What can I do in the bathroom? ?Use night lights. ?Install grab bars by the toilet and in the tub and shower. Do not use towel bars as grab bars. ?Use non-skid mats or decals in the tub or shower. ?If you need to sit down in the shower, use a plastic, non-slip stool. ?Keep the floor dry. Clean up any water that spills on the floor as soon as it happens. ?Remove soap buildup in the tub or shower regularly. ?Attach bath mats securely with double-sided  non-slip rug tape. ?Do not have throw rugs and other things on the floor that can make you trip. ?What can I do in the bedroom? ?Use night lights. ?Make sure that you have a light by your bed that is easy to reach. ?Do not use any sheets or blankets that are too big for your bed. They should not hang down onto the floor. ?Have a firm chair that has side arms. You can use this for support while you get dressed. ?Do not have throw rugs and other things on the floor that can make you trip. ?What can I do in the kitchen? ?Clean up any spills right away. ?Avoid walking on wet floors. ?Keep items that you use a lot in easy-to-reach places. ?If you need to reach something above you, use a strong step stool that has a grab bar. ?Keep electrical cords out of the way. ?Do not use floor polish or wax that makes floors slippery. If you must use wax, use non-skid floor wax. ?Do not have throw rugs and other things on the floor that can make you trip. ?What can I do with my stairs? ?Do not leave any items on the stairs. ?Make sure that there are handrails on both sides of the stairs and use them. Fix handrails that are broken or loose. Make sure that handrails are as long as the stairways. ?Check any carpeting to make sure that it is firmly attached to the stairs. Fix any carpet that is loose or  worn. ?Avoid having throw rugs at the top or bottom of the stairs. If you do have throw rugs, attach them to the floor with carpet tape. ?Make sure that you have a light switch at the top of the stairs and the bottom of the stairs. If you do not have them, ask someone to add them for you. ?What else can I do to help prevent falls? ?Wear shoes that: ?Do not have high heels. ?Have rubber bottoms. ?Are comfortable and fit you well. ?Are closed at the toe. Do not wear sandals. ?If you use a stepladder: ?Make sure that it is fully opened. Do not climb a closed stepladder. ?Make sure that both sides of the stepladder are locked into place. ?Ask  someone to hold it for you, if possible. ?Clearly mark and make sure that you can see: ?Any grab bars or handrails. ?First and last steps. ?Where the edge of each step is. ?Use tools that help you move around (m

## 2021-06-27 ENCOUNTER — Ambulatory Visit: Payer: Medicare HMO | Admitting: Family Medicine

## 2021-07-18 IMAGING — MG DIGITAL SCREENING BILAT W/ TOMO W/ CAD
8 series · 8 of 24 positions shown · non-contrast
Comparison: Previous exam(s).

CLINICAL DATA: Screening.

EXAM:
DIGITAL SCREENING BILATERAL MAMMOGRAM WITH TOMO AND CAD

[L MLO synth-2D]
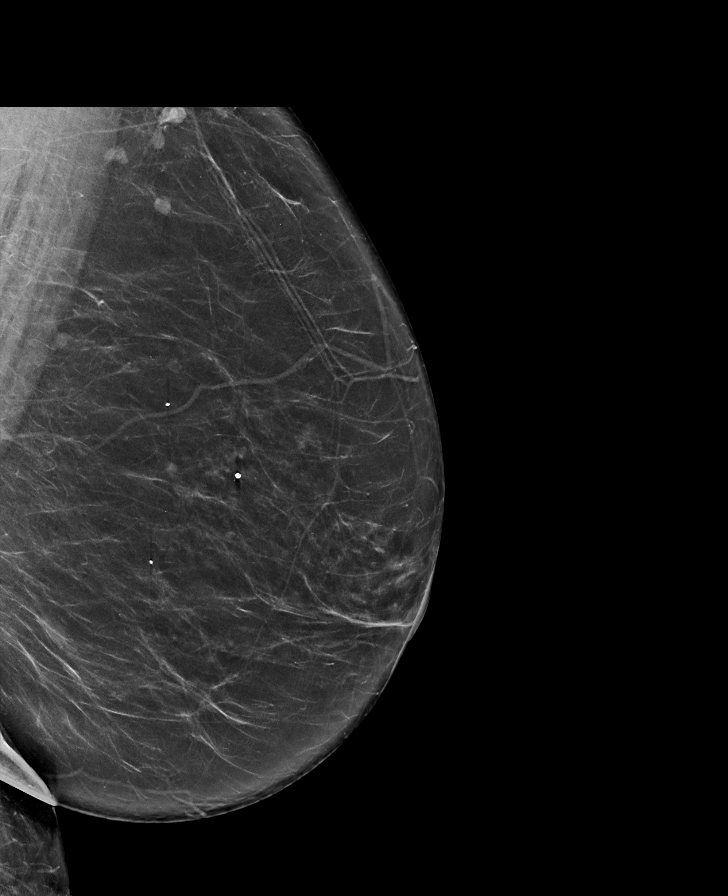

[L CC synth-2D]
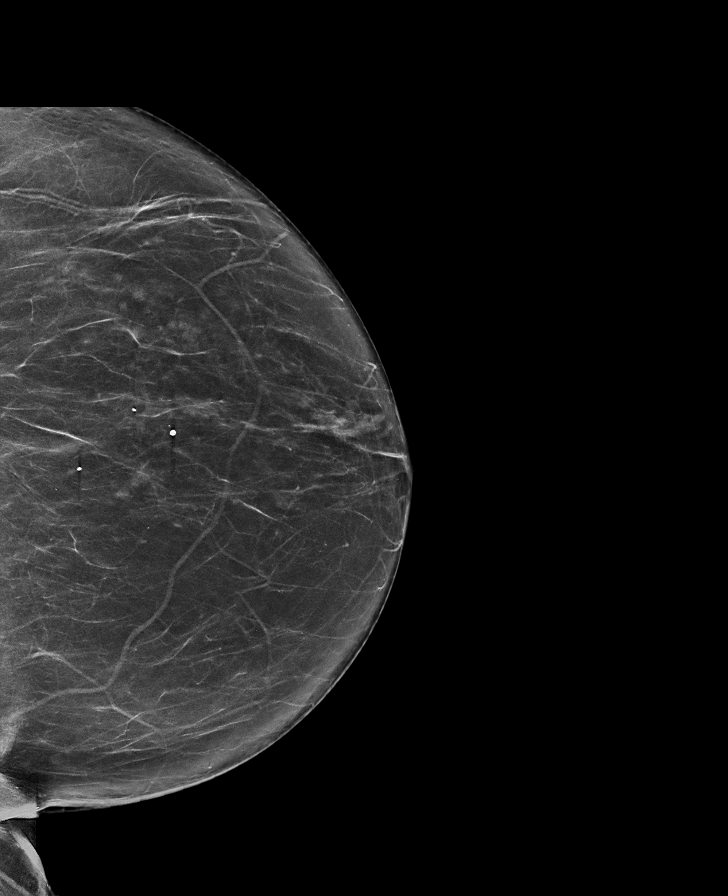

[R MLO synth-2D]
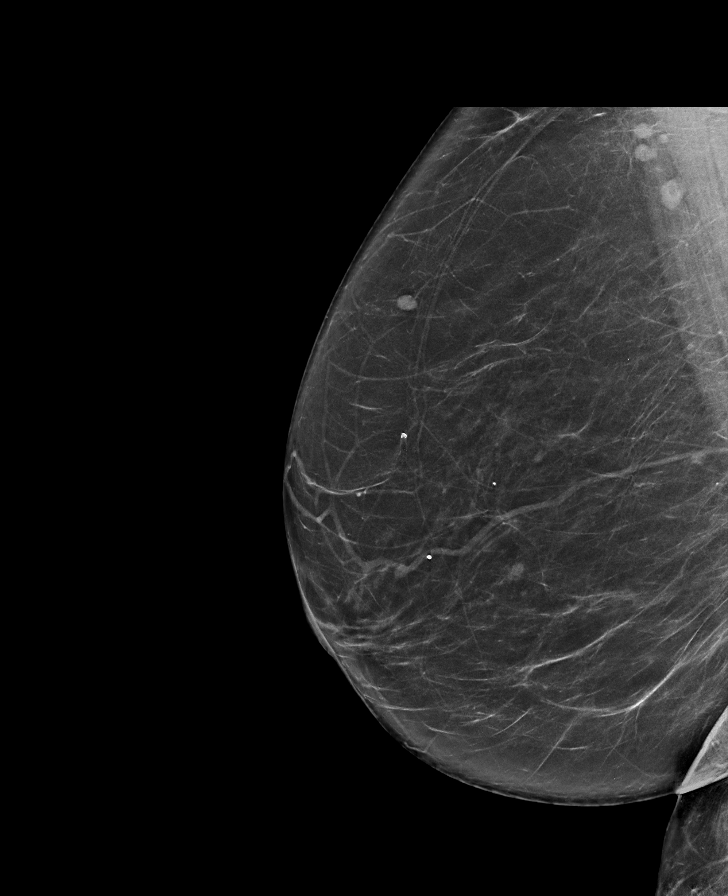

[R CC synth-2D]
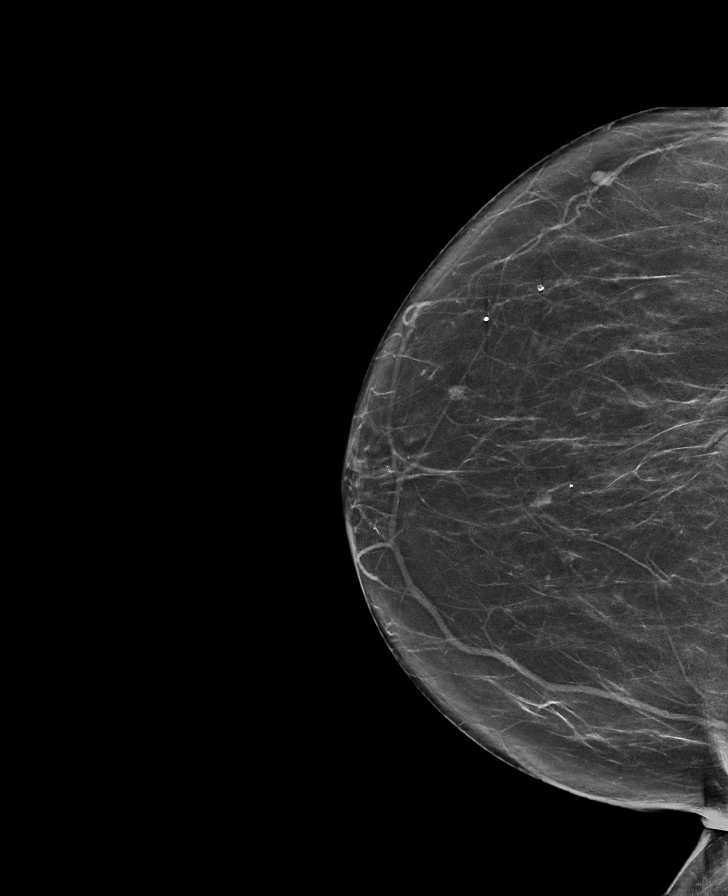

[R CC tomo · tomo slice 37/72.0]
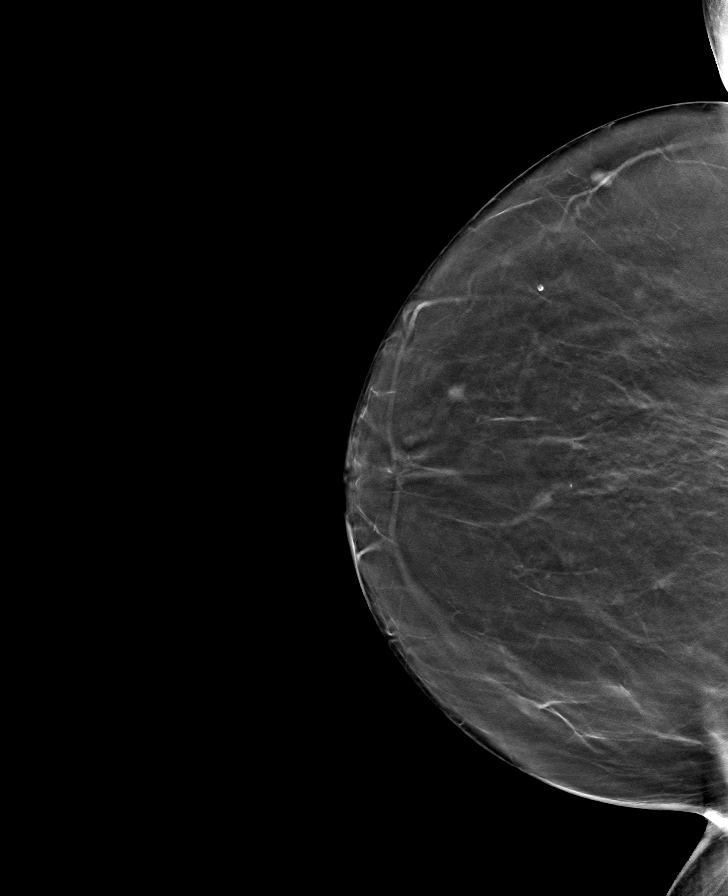

[L MLO tomo · tomo slice 40/79.0]
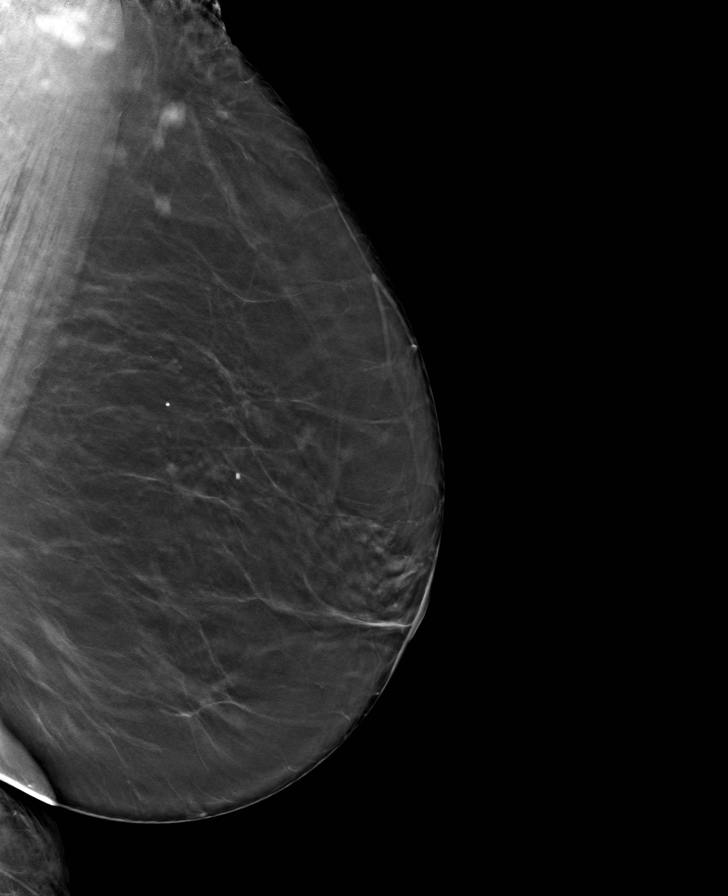

[L CC tomo · tomo slice 38/75.0]
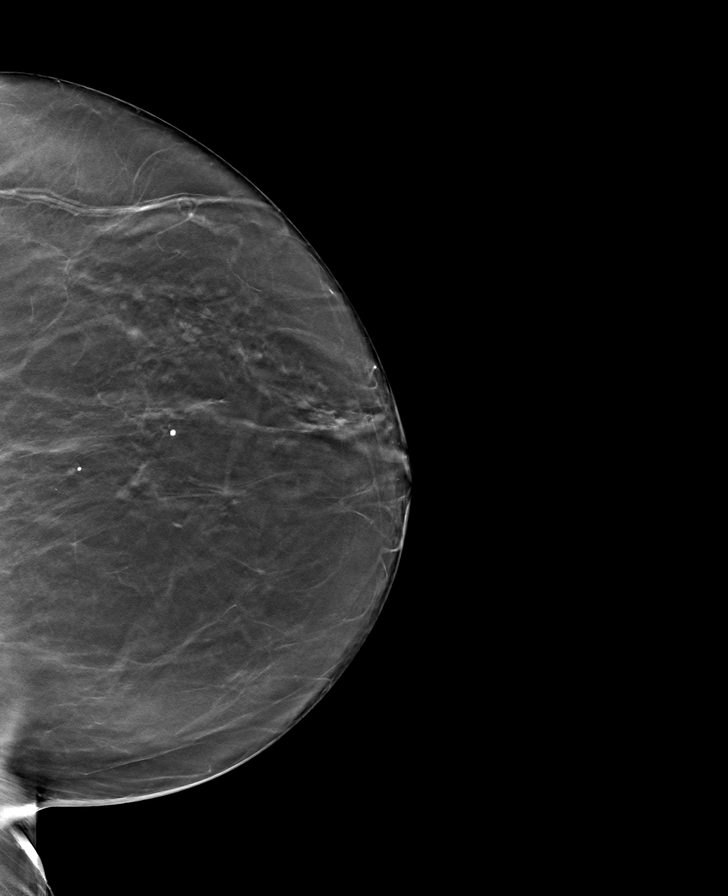

[R MLO tomo · tomo slice 39/77.0]
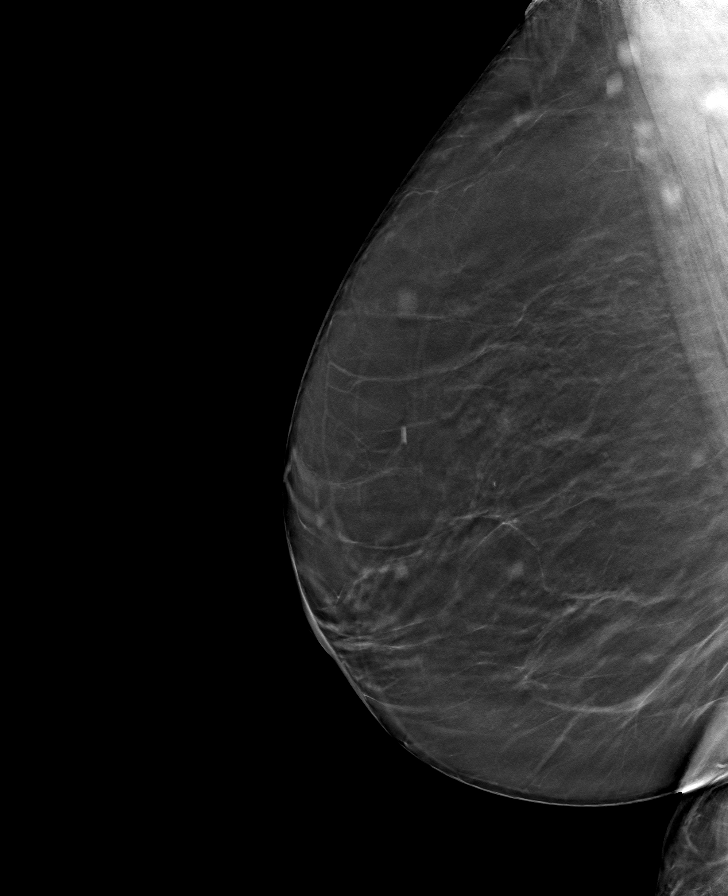

[8 of 24 positions shown; findings below may reference images not displayed]

ACR Breast Density Category b: There are scattered areas of
fibroglandular density.
FINDINGS: There are no findings suspicious for malignancy. Images were
processed with CAD.
IMPRESSION: No mammographic evidence of malignancy. A result letter of this
screening mammogram will be mailed directly to the patient.

RECOMMENDATION:
Screening mammogram in one year. (Code:[id number])

BI-RADS CATEGORY  1: Negative.

## 2021-07-25 ENCOUNTER — Encounter: Payer: Self-pay | Admitting: Internal Medicine

## 2021-07-25 ENCOUNTER — Ambulatory Visit (INDEPENDENT_AMBULATORY_CARE_PROVIDER_SITE_OTHER): Payer: Medicare HMO | Admitting: Internal Medicine

## 2021-07-25 VITALS — BP 122/80 | HR 84 | Resp 18 | Ht 62.0 in | Wt 135.6 lb

## 2021-07-25 DIAGNOSIS — R0789 Other chest pain: Secondary | ICD-10-CM | POA: Diagnosis not present

## 2021-07-25 DIAGNOSIS — I10 Essential (primary) hypertension: Secondary | ICD-10-CM | POA: Diagnosis not present

## 2021-07-25 NOTE — Patient Instructions (Addendum)
The EKG looks good today.   Try calcium daily or magnesium daily to help with the muscles and drink plenty of fluids.  Think about the calcium score as a screening option for the heart,

## 2021-07-25 NOTE — Assessment & Plan Note (Signed)
EKG done today without changes. She does have hypertension and age as risk factors. BP at goal today. No blood work indicated. Can order calcium score discussed today and she will think about this.

## 2021-07-25 NOTE — Assessment & Plan Note (Signed)
Bp at goal on valsartan 80 mg daily and hctz 25 mg daily. Continue without change.

## 2021-07-25 NOTE — Progress Notes (Signed)
   Subjective:   Patient ID: Kayla Key, female    DOB: September 01, 1945, 76 y.o.   MRN: [id number]  HPI The patient is a 76 YO female coming in for nausea and chest pain. Happened in the middle of the night while on vacation she feels indigestion. Gone now.  Review of Systems  Constitutional: Negative.   HENT: Negative.    Eyes: Negative.   Respiratory:  Negative for cough, chest tightness and shortness of breath.   Cardiovascular:  Positive for chest pain. Negative for palpitations and leg swelling.  Gastrointestinal:  Negative for abdominal distention, abdominal pain, constipation, diarrhea, nausea and vomiting.  Musculoskeletal: Negative.   Skin: Negative.   Neurological: Negative.   Psychiatric/Behavioral: Negative.      Objective:  Physical Exam Constitutional:      Appearance: She is well-developed.  HENT:     Head: Normocephalic and atraumatic.  Cardiovascular:     Rate and Rhythm: Normal rate and regular rhythm.  Pulmonary:     Effort: Pulmonary effort is normal. No respiratory distress.     Breath sounds: Normal breath sounds. No wheezing or rales.  Abdominal:     General: Bowel sounds are normal. There is no distension.     Palpations: Abdomen is soft.     Tenderness: There is no abdominal tenderness. There is no rebound.  Musculoskeletal:     Cervical back: Normal range of motion.  Skin:    General: Skin is warm and dry.  Neurological:     Mental Status: She is alert and oriented to person, place, and time.     Coordination: Coordination normal.     Vitals:   07/25/21 1039  BP: 122/80  Pulse: 84  Resp: 18  SpO2: 92%  Weight: 135 lb 9.6 oz (61.5 kg)  Height: 5\' 2"  (1.575 m)    EKG: Rate 79, axis normal, interval short PR not changed, sinus, no st or t wave changes, no significant change compared to prior 2021   Assessment & Plan:

## 2021-09-25 ENCOUNTER — Other Ambulatory Visit: Payer: Self-pay | Admitting: Internal Medicine

## 2021-09-25 DIAGNOSIS — Z1231 Encounter for screening mammogram for malignant neoplasm of breast: Secondary | ICD-10-CM

## 2021-10-19 ENCOUNTER — Inpatient Hospital Stay: Admission: RE | Admit: 2021-10-19 | Payer: Medicare HMO | Source: Ambulatory Visit

## 2021-10-23 DIAGNOSIS — S52572A Other intraarticular fracture of lower end of left radius, initial encounter for closed fracture: Secondary | ICD-10-CM | POA: Diagnosis not present

## 2021-11-21 ENCOUNTER — Ambulatory Visit
Admission: RE | Admit: 2021-11-21 | Discharge: 2021-11-21 | Disposition: A | Discharge status: Home / Self Care | Payer: BC Managed Care – PPO | Source: Ambulatory Visit | Attending: Internal Medicine | Admitting: Internal Medicine

## 2021-11-21 DIAGNOSIS — Z1231 Encounter for screening mammogram for malignant neoplasm of breast: Secondary | ICD-10-CM

## 2021-11-23 ENCOUNTER — Telehealth: Payer: Self-pay

## 2021-11-23 NOTE — Telephone Encounter (Signed)
-----   Message from Hoyt Koch, MD sent at 11/23/2021 12:27 PM EDT ----- Normal mammogram repeat 1-2 years.

## 2021-11-23 NOTE — Telephone Encounter (Signed)
Contacted patient and went over lab results.

## 2021-12-04 ENCOUNTER — Encounter: Payer: Self-pay | Admitting: Internal Medicine

## 2021-12-04 ENCOUNTER — Ambulatory Visit (INDEPENDENT_AMBULATORY_CARE_PROVIDER_SITE_OTHER): Payer: BC Managed Care – PPO | Admitting: Internal Medicine

## 2021-12-04 VITALS — BP 120/68 | HR 75 | Temp 97.4°F | Ht 62.0 in | Wt 145.4 lb

## 2021-12-04 DIAGNOSIS — Z0000 Encounter for general adult medical examination without abnormal findings: Secondary | ICD-10-CM | POA: Diagnosis not present

## 2021-12-04 DIAGNOSIS — R7303 Prediabetes: Secondary | ICD-10-CM | POA: Diagnosis not present

## 2021-12-04 DIAGNOSIS — T8484XD Pain due to internal orthopedic prosthetic devices, implants and grafts, subsequent encounter: Secondary | ICD-10-CM

## 2021-12-04 DIAGNOSIS — I10 Essential (primary) hypertension: Secondary | ICD-10-CM | POA: Diagnosis not present

## 2021-12-04 DIAGNOSIS — Z96659 Presence of unspecified artificial knee joint: Secondary | ICD-10-CM | POA: Diagnosis not present

## 2021-12-04 LAB — CBC
HCT: 35.7 % — ABNORMAL LOW (ref 36.0–46.0)
Hemoglobin: 11.5 g/dL — ABNORMAL LOW (ref 12.0–15.0)
MCHC: 32.2 g/dL (ref 30.0–36.0)
MCV: 84 fl (ref 78.0–100.0)
Platelets: 349 10*3/uL (ref 150.0–400.0)
RBC: 4.24 Mil/uL (ref 3.87–5.11)
RDW: 14.1 % (ref 11.5–15.5)
WBC: 8.2 10*3/uL (ref 4.0–10.5)

## 2021-12-04 LAB — COMPREHENSIVE METABOLIC PANEL
ALT: 15 U/L (ref 0–35)
AST: 17 U/L (ref 0–37)
Albumin: 3.9 g/dL (ref 3.5–5.2)
Alkaline Phosphatase: 99 U/L (ref 39–117)
BUN: 15 mg/dL (ref 6–23)
CO2: 31 mEq/L (ref 19–32)
Calcium: 8.9 mg/dL (ref 8.4–10.5)
Chloride: 102 mEq/L (ref 96–112)
Creatinine, Ser: 1.03 mg/dL (ref 0.40–1.20)
GFR: 53.75 mL/min — ABNORMAL LOW (ref >60.00)
Glucose, Bld: 79 mg/dL (ref 70–99)
Potassium: 3.4 mEq/L — ABNORMAL LOW (ref 3.5–5.1)
Sodium: 140 mEq/L (ref 135–145)
Total Bilirubin: 0.4 mg/dL (ref 0.2–1.2)
Total Protein: 6.8 g/dL (ref 6.0–8.3)

## 2021-12-04 LAB — LIPID PANEL
Cholesterol: 178 mg/dL (ref 0–200)
HDL: 83 mg/dL (ref >39.00)
LDL Cholesterol: 86 mg/dL (ref 0–99)
NonHDL: 94.65
Total CHOL/HDL Ratio: 2
Triglycerides: 44 mg/dL (ref 0.0–149.0)
VLDL: 8.8 mg/dL (ref 0.0–40.0)

## 2021-12-04 LAB — HEMOGLOBIN A1C: Hgb A1c MFr Bld: 6.3 % (ref 4.6–6.5)

## 2021-12-04 NOTE — Assessment & Plan Note (Signed)
Flu shot up to date. Covid-19 up to date. Pneumonia complete. Shingrix complete. Tetanus due declines. Colonoscopy up to date. Mammogram up to date, pap smear aged out and dexa complete. Counseled about sun safety and mole surveillance. Counseled about the dangers of distracted driving. Given 10 year screening recommendations.

## 2021-12-04 NOTE — Progress Notes (Signed)
   Subjective:   Patient ID: Kayla Key, female    DOB: 03/17/1945, 76 y.o.   MRN: [id number]  HPI The patient is here for physical.  PMH, [facility], social history reviewed and updated  Review of Systems  Constitutional: Negative.   HENT: Negative.    Eyes: Negative.   Respiratory:  Negative for cough, chest tightness and shortness of breath.   Cardiovascular:  Negative for chest pain, palpitations and leg swelling.  Gastrointestinal:  Negative for abdominal distention, abdominal pain, constipation, diarrhea, nausea and vomiting.  Musculoskeletal:  Positive for arthralgias.  Skin: Negative.   Neurological: Negative.   Psychiatric/Behavioral: Negative.      Objective:  Physical Exam Constitutional:      Appearance: She is well-developed.  HENT:     Head: Normocephalic and atraumatic.  Cardiovascular:     Rate and Rhythm: Normal rate and regular rhythm.  Pulmonary:     Effort: Pulmonary effort is normal. No respiratory distress.     Breath sounds: Normal breath sounds. No wheezing or rales.  Abdominal:     General: Bowel sounds are normal. There is no distension.     Palpations: Abdomen is soft.     Tenderness: There is no abdominal tenderness. There is no rebound.  Musculoskeletal:        General: Tenderness present.     Cervical back: Normal range of motion.     Comments: Left wrist in brace  Skin:    General: Skin is warm and dry.  Neurological:     Mental Status: She is alert and oriented to person, place, and time.     Coordination: Coordination normal.     Vitals:   12/04/21 1304  BP: 120/68  Pulse: 75  Temp: (!) 97.4 F (36.3 C)  TempSrc: Oral  SpO2: 98%  Weight: 145 lb 6 oz (65.9 kg)  Height: 5\' 2"  (1.575 m)    Assessment & Plan:

## 2021-12-04 NOTE — Assessment & Plan Note (Signed)
Still having pain with getting up. Given handicapped placard to use as needed.

## 2021-12-04 NOTE — Assessment & Plan Note (Signed)
BP at goal on valsartan 80 mg daily and hctz 25 mg daily. Checking CMP and CBC and lipid panel and adjust as needed.

## 2021-12-04 NOTE — Assessment & Plan Note (Signed)
Checking HgA1c and adjust as needed.  

## 2021-12-11 ENCOUNTER — Other Ambulatory Visit: Payer: Self-pay

## 2021-12-11 DIAGNOSIS — I10 Essential (primary) hypertension: Secondary | ICD-10-CM

## 2021-12-11 MED ORDER — HYDROCHLOROTHIAZIDE 25 MG PO TABS: 25.0000 mg | ORAL_TABLET | Freq: Every day | ORAL | 3 refills | Status: DC

## 2021-12-21 ENCOUNTER — Other Ambulatory Visit: Payer: Self-pay | Admitting: Internal Medicine

## 2022-01-31 ENCOUNTER — Ambulatory Visit (INDEPENDENT_AMBULATORY_CARE_PROVIDER_SITE_OTHER): Payer: Medicare HMO | Admitting: Internal Medicine

## 2022-01-31 ENCOUNTER — Encounter: Payer: Self-pay | Admitting: Internal Medicine

## 2022-01-31 VITALS — BP 120/80 | HR 72 | Temp 97.9°F | Ht 62.0 in | Wt 145.0 lb

## 2022-01-31 DIAGNOSIS — M8589 Other specified disorders of bone density and structure, multiple sites: Secondary | ICD-10-CM

## 2022-01-31 NOTE — Patient Instructions (Signed)
We will get the bone density scan.

## 2022-01-31 NOTE — Assessment & Plan Note (Signed)
Discussed prior osteopenia and recent fracture end of Sept 2023. Ordered repeat DEXA and will treat as appropriate.

## 2022-01-31 NOTE — Progress Notes (Signed)
   Subjective:   Patient ID: Kayla Key, female    DOB: 06/28/1945, 77 y.o.   MRN: [id number]  HPI The patient is a 77 YO female coming in for follow up fracture/bone density.   Review of Systems  Constitutional: Negative.   HENT: Negative.    Eyes: Negative.   Respiratory:  Negative for cough, chest tightness and shortness of breath.   Cardiovascular:  Negative for chest pain, palpitations and leg swelling.  Gastrointestinal:  Negative for abdominal distention, abdominal pain, constipation, diarrhea, nausea and vomiting.  Musculoskeletal: Negative.   Skin: Negative.   Neurological: Negative.   Psychiatric/Behavioral: Negative.      Objective:  Physical Exam Constitutional:      Appearance: She is well-developed.  HENT:     Head: Normocephalic and atraumatic.  Cardiovascular:     Rate and Rhythm: Normal rate and regular rhythm.  Pulmonary:     Effort: Pulmonary effort is normal. No respiratory distress.     Breath sounds: Normal breath sounds. No wheezing or rales.  Abdominal:     General: Bowel sounds are normal. There is no distension.     Palpations: Abdomen is soft.     Tenderness: There is no abdominal tenderness. There is no rebound.  Musculoskeletal:     Cervical back: Normal range of motion.  Skin:    General: Skin is warm and dry.  Neurological:     Mental Status: She is alert and oriented to person, place, and time.     Coordination: Coordination normal.     Vitals:   01/31/22 1021  BP: 120/80  Pulse: 72  Temp: 97.9 F (36.6 C)  TempSrc: Oral  SpO2: 99%  Weight: 145 lb (65.8 kg)  Height: 5\' 2"  (1.575 m)    Assessment & Plan:

## 2022-02-06 ENCOUNTER — Ambulatory Visit (INDEPENDENT_AMBULATORY_CARE_PROVIDER_SITE_OTHER)
Admission: RE | Admit: 2022-02-06 | Discharge: 2022-02-06 | Disposition: A | Discharge status: Home / Self Care | Payer: Medicare HMO | Source: Ambulatory Visit

## 2022-02-06 DIAGNOSIS — M8589 Other specified disorders of bone density and structure, multiple sites: Secondary | ICD-10-CM

## 2022-02-22 ENCOUNTER — Ambulatory Visit: Payer: Medicare HMO | Admitting: Internal Medicine

## 2022-02-25 ENCOUNTER — Ambulatory Visit (INDEPENDENT_AMBULATORY_CARE_PROVIDER_SITE_OTHER): Payer: Medicare HMO | Admitting: Internal Medicine

## 2022-02-25 ENCOUNTER — Encounter: Payer: Self-pay | Admitting: Internal Medicine

## 2022-02-25 VITALS — BP 120/80 | HR 80 | Temp 98.0°F | Ht 62.0 in | Wt 142.0 lb

## 2022-02-25 DIAGNOSIS — R052 Subacute cough: Secondary | ICD-10-CM

## 2022-02-25 DIAGNOSIS — R059 Cough, unspecified: Secondary | ICD-10-CM | POA: Insufficient documentation

## 2022-02-25 DIAGNOSIS — M810 Age-related osteoporosis without current pathological fracture: Secondary | ICD-10-CM | POA: Diagnosis not present

## 2022-02-25 MED ORDER — ALENDRONATE SODIUM 70 MG PO TABS: 70.0000 mg | ORAL_TABLET | ORAL | 3 refills | Status: DC

## 2022-02-25 MED ORDER — PREDNISONE 20 MG PO TABS: 40.0000 mg | ORAL_TABLET | Freq: Every day | ORAL | 0 refills | Status: DC

## 2022-02-25 MED ORDER — BENZONATATE 200 MG PO CAPS: 200.0000 mg | ORAL_CAPSULE | Freq: Three times a day (TID) | ORAL | 0 refills | Status: DC | PRN

## 2022-02-25 NOTE — Assessment & Plan Note (Signed)
Discussed results of DEXA and recent fracture. I do recommend treatment for new osteoporosis. Rx fosamax 70 mg weekly and rx done. Plan for repeat DEXA 2 years.

## 2022-02-25 NOTE — Assessment & Plan Note (Signed)
Likely post-viral cough. Going on 3 weeks now and lungs clear on exam. No SOB. Rx prednisone 5 day course and tessalon perles 200 mg TID prn to help.

## 2022-02-25 NOTE — Patient Instructions (Signed)
We have sent in fosamax to take weekly for the bones.  We have sent in prednisone and the tessalon perles for cough to take.

## 2022-02-25 NOTE — Progress Notes (Signed)
   Subjective:   Patient ID: Kayla Key, female    DOB: 11/29/1945, 77 y.o.   MRN: [id number]  HPI The patient is a 77 YO female coming in for multiple concerns.   Review of Systems  Constitutional: Negative.  Negative for activity change and appetite change.  HENT: Negative.    Eyes: Negative.   Respiratory:  Positive for cough. Negative for chest tightness and shortness of breath.   Cardiovascular:  Negative for chest pain, palpitations and leg swelling.  Gastrointestinal:  Negative for abdominal distention, abdominal pain, constipation, diarrhea, nausea and vomiting.  Musculoskeletal: Negative.   Skin: Negative.   Neurological: Negative.   Psychiatric/Behavioral: Negative.      Objective:  Physical Exam Constitutional:      Appearance: She is well-developed.  HENT:     Head: Normocephalic and atraumatic.  Cardiovascular:     Rate and Rhythm: Normal rate and regular rhythm.  Pulmonary:     Effort: Pulmonary effort is normal. No respiratory distress.     Breath sounds: Normal breath sounds. No wheezing or rales.  Abdominal:     General: Bowel sounds are normal. There is no distension.     Palpations: Abdomen is soft.     Tenderness: There is no abdominal tenderness. There is no rebound.  Musculoskeletal:     Cervical back: Normal range of motion.  Skin:    General: Skin is warm and dry.  Neurological:     Mental Status: She is alert and oriented to person, place, and time.     Coordination: Coordination normal.     Vitals:   02/25/22 1002  BP: 120/80  Pulse: 80  Temp: 98 F (36.7 C)  TempSrc: Oral  SpO2: 99%  Weight: 142 lb (64.4 kg)  Height: 5\' 2"  (1.575 m)    Assessment & Plan:

## 2022-03-25 ENCOUNTER — Other Ambulatory Visit: Payer: Self-pay | Admitting: Internal Medicine

## 2022-05-23 ENCOUNTER — Telehealth: Payer: Self-pay | Admitting: Internal Medicine

## 2022-05-23 NOTE — Telephone Encounter (Signed)
Called patient to schedule Medicare Annual Wellness Visit (AWV). Left message for patient to call back and schedule Medicare Annual Wellness Visit (AWV).  Last date of AWV: 05/08/2021  Please schedule an appointment at any time with [facility].  If any questions, please contact me at [phone] .  Thank you ,  Bernice Cicero Care Guide [facility] AWV TEAM Direct Dial: [phone]     

## 2022-07-29 ENCOUNTER — Ambulatory Visit (INDEPENDENT_AMBULATORY_CARE_PROVIDER_SITE_OTHER): Payer: Medicare HMO

## 2022-07-29 DIAGNOSIS — Z0000 Encounter for general adult medical examination without abnormal findings: Secondary | ICD-10-CM

## 2022-07-29 NOTE — Progress Notes (Cosign Needed)
Subjective:   Kayla Key is a 77 y.o. female who presents for Medicare Annual (Subsequent) preventive examination.  Visit Complete: Virtual  I connected with  Kayla Key on 07/29/22 by a audio enabled telemedicine application and verified that I am speaking with the correct person using two identifiers.  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 The patient expressed understanding and agreed to proceed.  Patient Medicare AWV questionnaire was completed by the patient on 07/29/2022; I have confirmed that all information answered by patient is correct and no changes since this date.  Cardiac Risk Factors include: advanced age (>31men, >22 women)     Objective:    Today's Vitals   07/29/22 0917  PainSc: 4    There is no height or weight on file to calculate BMI.     07/29/2022    9:24 AM 05/08/2021    1:39 PM 05/11/2020    4:40 PM 04/14/2020    9:29 AM 07/01/2018    3:37 PM 10/29/2017   12:48 PM 04/06/2014   11:12 AM  Advanced Directives  Does Patient Have a Medical Advance Directive? No No No No No No No  Would patient like information on creating a medical advance directive? Yes (MAU/Ambulatory/Procedural Areas - Information given) No - Patient declined  No - Patient declined No - Patient declined Yes (ED - Information included in AVS)     Current Medications (verified) Outpatient Encounter Medications as of 07/29/2022  Medication Sig   alendronate (FOSAMAX) 70 MG tablet Take 1 tablet (70 mg total) by mouth every 7 (seven) days. Take with a full glass of water on an empty stomach.   benzonatate (TESSALON) 200 MG capsule Take 1 capsule (200 mg total) by mouth 3 (three) times daily as needed.   hydrochlorothiazide (HYDRODIURIL) 25 MG tablet Take 1 tablet (25 mg total) by mouth daily.   Multiple Vitamin (MULTI-VITAMIN DAILY PO) Take 1 tablet by mouth daily.   predniSONE  (DELTASONE) 20 MG tablet Take 2 tablets (40 mg total) by mouth daily with breakfast.   valsartan (DIOVAN) 80 MG tablet TAKE 1 TABLET BY MOUTH EVERY DAY   No facility-administered encounter medications on file as of 07/29/2022.    Allergies (verified) Patient has no known allergies.   History: Past Medical History:  Diagnosis Date   Arthritis    Asthma    GERD (gastroesophageal reflux disease)    Hypertension    MHA (microangiopathic hemolytic anemia) (HCC)    Osteoporosis    Past Surgical History:  Procedure Laterality Date   BUNIONECTOMY Right    KNEE ARTHROSCOPY Right 2009   KNEE ARTHROSCOPY Left 2015   TOE SURGERY Bilateral 2005   TUBAL LIGATION  1988   Family History  Problem Relation Age of Onset   Stroke Father    Hypertension Sister    Breast cancer Sister 74   Cancer Other        breast    Breast cancer Maternal Aunt 3   Breast cancer Cousin 40   Colon cancer Neg Hx    Social History   Socioeconomic History   Marital status: Single    Spouse name: Not on file   Number of children: Not on file   Years of education: Not on file   Highest education level: Not on file  Occupational History   Not on file  Tobacco Use   Smoking status: Never  Smokeless tobacco: Never  Vaping Use   Vaping Use: Never used  Substance and Sexual Activity   Alcohol use: No    Alcohol/week: 0.0 standard drinks of alcohol   Drug use: No   Sexual activity: Not Currently  Other Topics Concern   Not on file  Social History Narrative   Not on file   Social Determinants of Health   Financial Resource Strain: Low Risk  (07/29/2022)   Overall Financial Resource Strain (CARDIA)    Difficulty of Paying Living Expenses: Not hard at all  Food Insecurity: No Food Insecurity (07/29/2022)   Hunger Vital Sign    Worried About Running Out of Food in the Last Year: Never true    Ran Out of Food in the Last Year: Never true  Transportation Needs: No Transportation Needs (07/29/2022)    PRAPARE - Administrator, Civil Service (Medical): No    Lack of Transportation (Non-Medical): No  Physical Activity: Inactive (07/29/2022)   Exercise Vital Sign    Days of Exercise per Week: 0 days    Minutes of Exercise per Session: 0 min  Stress: No Stress Concern Present (07/29/2022)   [employer] of Occupational Health - Occupational Stress Questionnaire    Feeling of Stress : Not at all  Social Connections: Moderately Integrated (07/29/2022)   Social Connection and Isolation Panel [NHANES]    Frequency of Communication with Friends and Family: More than three times a week    Frequency of Social Gatherings with Friends and Family: More than three times a week    Attends Religious Services: More than 4 times per year    Active Member of [employer] or Organizations: Yes    Attends Banker Meetings: More than 4 times per year    Marital Status: Widowed    Tobacco Counseling Counseling given: Not Answered   Clinical Intake:  Pre-visit preparation completed: Yes  Pain : 0-10 Pain Score: 4  Pain Type: Chronic pain Pain Location: Knee Pain Orientation: Right, Left     Diabetes: No  How often do you need to have someone help you when you read instructions, pamphlets, or other written materials from your doctor or pharmacy?: 1 - Never What is the last grade level you completed in school?: masters degree  Interpreter Needed?: No      Activities of Daily Living    07/29/2022    9:33 AM 07/29/2022    9:18 AM  In your present state of health, do you have any difficulty performing the following activities:  Hearing? 0 1  Vision? 0 0  Difficulty concentrating or making decisions? 0 0  Walking or climbing stairs? 0 0  Dressing or bathing? 0 0  Doing errands, shopping? 0   Preparing Food and eating ? N N  Using the Toilet? N N  In the past six months, have you accidently leaked urine? N N  Do you have problems with loss of bowel control? N N  Managing  your Medications? N N  Managing your Finances? N N  Housekeeping or managing your Housekeeping? N N    Patient Care Team: Myrlene Broker, MD as PCP - General (Internal Medicine)  Indicate any recent Medical Services you may have received from other than [facility] providers in the past year (date may be approximate).     Assessment:   This is a routine wellness examination for Kayla Key.  Hearing/Vision screen No results found.  Dietary issues and exercise activities discussed:  Goals Addressed             This Visit's Progress    quit working        Depression Screen    07/29/2022    9:26 AM 07/29/2022    9:23 AM 02/25/2022   10:09 AM 01/31/2022   10:26 AM 01/31/2022   10:25 AM 07/25/2021   10:43 AM 05/08/2021   12:57 PM  PHQ 2/9 Scores  PHQ - 2 Score 0 0 0 0 0 0 0  PHQ- 9 Score 0 0 0 0  0     Fall Risk    07/29/2022    9:25 AM 02/25/2022   10:09 AM 01/31/2022   10:25 AM 01/31/2022   10:22 AM 07/25/2021   10:43 AM  Fall Risk   Falls in the past year? 1 0 1 0 0  Number falls in past yr: 1 0 0 0 0  Injury with Fall? 1 0 1 0 0  Risk for fall due to : History of fall(s)      Follow up Falls evaluation completed Falls evaluation completed Falls evaluation completed Falls evaluation completed     MEDICARE RISK AT HOME:  Medicare Risk at Home - 07/29/22 0925     Any stairs in or around the home? No    If so, are there any without handrails? No    Home free of loose throw rugs in walkways, pet beds, electrical cords, etc? Yes    Adequate lighting in your home to reduce risk of falls? Yes    Life alert? No    Use of a cane, walker or w/c? No    Grab bars in the bathroom? No    Shower chair or bench in shower? Yes    Elevated toilet seat or a handicapped toilet? Yes             TIMED UP AND GO:  Was the test performed?  No    Cognitive Function:        07/29/2022    9:26 AM 05/08/2021   12:58 PM  6CIT Screen  What Year? 0 points 0 points  What month? 0  points 0 points  What time? 0 points 0 points  Count back from 20 0 points 0 points  Months in reverse 0 points 0 points  Repeat phrase 0 points 0 points  Total Score 0 points 0 points    Immunizations Immunization History  Administered Date(s) Administered   Covid-19, Mrna,Vaccine(Spikevax)22yrs and older 05/14/2022   Fluad Quad(high Dose 65+) 11/23/2021   Influenza, High Dose Seasonal PF 10/19/2015, 10/25/2016, 10/29/2017, 10/27/2018   Influenza,inj,Quad PF,6+ Mos 10/01/2012, 10/13/2013, 10/17/2014, 09/29/2019   Influenza-Unspecified 10/27/2018, 10/30/2020   Moderna SARS-COV2 Booster Vaccination 05/01/2020   Moderna Sars-Covid-2 Vaccination 02/23/2019, 03/23/2019, 11/20/2019   PPD Test 06/13/2010   Pfizer Covid-19 Vaccine Bivalent Booster 53yrs & up 10/30/2020, 05/26/2021   Pfizer Covid-19 Vaccine Bivalent Booster 5y-11y 10/22/2021   Pneumococcal Conjugate-13 10/13/2013   Pneumococcal Polysaccharide-23 10/17/2014   Td 06/17/2006   Zoster Recombinant(Shingrix) 05/26/2021, 09/28/2021    TDAP status: Due, Education has been provided regarding the importance of this vaccine. Advised may receive this vaccine at local pharmacy or Health Dept. Aware to provide a copy of the vaccination record if obtained from local pharmacy or Health Dept. Verbalized acceptance and understanding.  Flu Vaccine status: Up to date  Pneumococcal vaccine status: Up to date  Covid-19 vaccine status: Completed vaccines  Qualifies for Shingles Vaccine? Yes  Zostavax completed Yes   Shingrix Completed?: Yes  Screening Tests Health Maintenance  Topic Date Due   DTaP/Tdap/Td (2 - Tdap) 06/16/2016   INFLUENZA VACCINE  08/29/2022   Medicare Annual Wellness (AWV)  07/29/2023   MAMMOGRAM  11/22/2023   Colonoscopy  04/14/2024   Pneumonia Vaccine 10+ Years old  Completed   DEXA SCAN  Completed   COVID-19 Vaccine  Completed   Hepatitis C Screening  Completed   Zoster Vaccines- Shingrix  Completed   HPV  VACCINES  Aged Out    Health Maintenance  Health Maintenance Due  Topic Date Due   DTaP/Tdap/Td (2 - Tdap) 06/16/2016    Colorectal cancer screening: Type of screening: Colonoscopy. Completed 04/15/2014. Repeat every 10 years  Mammogram status: Completed 11/21/21. Repeat every year  Bone Density status: Completed 2023. Results reflect: Bone density results: OSTEOPOROSIS. Repeat every 1 years.  Lung Cancer Screening: (Low Dose CT Chest recommended if Age 69-80 years, 20 pack-year currently smoking OR have quit w/in 15years.) does not qualify.   Lung Cancer Screening Referral: na  Additional Screening:  Hepatitis C Screening: does qualify; Completed 10/19/2015  Vision Screening: Recommended annual ophthalmology exams for early detection of glaucoma and other disorders of the eye. Is the patient up to date with their annual eye exam?  Yes  Who is the provider or what is the name of the office in which the patient attends annual eye exams? [facility] If pt is not established with a provider, would they like to be referred to a provider to establish care?  Established .   Dental Screening: Recommended annual dental exams for proper oral hygiene  Diabetic Foot Exam:   Community Resource Referral / Chronic Care Management: CRR required this visit?  No   CCM required this visit?  No     Plan:     I have personally reviewed and noted the following in the patient's chart:   Medical and social history Use of alcohol, tobacco or illicit drugs  Current medications and supplements including opioid prescriptions. Patient is not currently taking opioid prescriptions. Functional ability and status Nutritional status Physical activity Advanced directives List of other physicians Hospitalizations, surgeries, and ER visits in previous 12 months Vitals Screenings to include cognitive, depression, and falls Referrals and appointments  In addition, I have reviewed and  discussed with patient certain preventive protocols, quality metrics, and best practice recommendations. A written personalized care plan for preventive services as well as general preventive health recommendations were provided to patient.     Delana Meyer   07/29/2022   After Visit Summary: (MyChart) Due to this being a telephonic visit, the after visit summary with patients personalized plan was offered to patient via MyChart   Nurse Notes: none

## 2022-07-29 NOTE — Patient Instructions (Addendum)
Kayla Key , Thank you for taking time to come for your Medicare Wellness Visit. I appreciate your ongoing commitment to your health goals. Please review the following plan we discussed and let me know if I can assist you in the future.   These are the goals we discussed:  Goals      My goal is to get back to the gym and start being active again.     Patient Stated     Maintain current health status.     quit working        This is a list of the screening recommended for you and due dates:  Health Maintenance  Topic Date Due   DTaP/Tdap/Td vaccine (2 - Tdap) 06/16/2016   Flu Shot  08/29/2022   Medicare Annual Wellness Visit  07/29/2023   Mammogram  11/22/2023   Colon Cancer Screening  04/14/2024   Pneumonia Vaccine  Completed   DEXA scan (bone density measurement)  Completed   COVID-19 Vaccine  Completed   Hepatitis C Screening  Completed   Zoster (Shingles) Vaccine  Completed   HPV Vaccine  Aged Out    Health Maintenance, Female Adopting a healthy lifestyle and getting preventive care are important in promoting health and wellness. Ask your health care provider about: The right schedule for you to have regular tests and exams. Things you can do on your own to prevent diseases and keep yourself healthy. What should I know about diet, weight, and exercise? Eat a healthy diet  Eat a diet that includes plenty of vegetables, fruits, low-fat dairy products, and lean protein. Do not eat a lot of foods that are high in solid fats, added sugars, or sodium. Maintain a healthy weight Body mass index (BMI) is used to identify weight problems. It estimates body fat based on height and weight. Your health care provider can help determine your BMI and help you achieve or maintain a healthy weight. Get regular exercise Get regular exercise. This is one of the most important things you can do for your health. Most adults should: Exercise for at least 150 minutes each week. The exercise  should increase your heart rate and make you sweat (moderate-intensity exercise). Do strengthening exercises at least twice a week. This is in addition to the moderate-intensity exercise. Spend less time sitting. Even light physical activity can be beneficial. Watch cholesterol and blood lipids Have your blood tested for lipids and cholesterol at 75 years of age, then have this test every 5 years. Have your cholesterol levels checked more often if: Your lipid or cholesterol levels are high. You are older than 75 years of age. You are at high risk for heart disease. What should I know about cancer screening? Depending on your health history and family history, you may need to have cancer screening at various ages. This may include screening for: Breast cancer. Cervical cancer. Colorectal cancer. Skin cancer. Lung cancer. What should I know about heart disease, diabetes, and high blood pressure? Blood pressure and heart disease High blood pressure causes heart disease and increases the risk of stroke. This is more likely to develop in people who have high blood pressure readings or are overweight. Have your blood pressure checked: Every 3-5 years if you are 23-3 years of age. Every year if you are 52 years old or older. Diabetes Have regular diabetes screenings. This checks your fasting blood sugar level. Have the screening done: Once every three years after age 46 if you are at  a normal weight and have a low risk for diabetes. More often and at a younger age if you are overweight or have a high risk for diabetes. What should I know about preventing infection? Hepatitis B If you have a higher risk for hepatitis B, you should be screened for this virus. Talk with your health care provider to find out if you are at risk for hepatitis B infection. Hepatitis C Testing is recommended for: Everyone born from [birth year] through 1963. Anyone with known risk factors for hepatitis C. Sexually  transmitted infections (STIs) Get screened for STIs, including gonorrhea and chlamydia, if: You are sexually active and are younger than 76 years of age. You are older than 75 years of age and your health care provider tells you that you are at risk for this type of infection. Your sexual activity has changed since you were last screened, and you are at increased risk for chlamydia or gonorrhea. Ask your health care provider if you are at risk. Ask your health care provider about whether you are at high risk for HIV. Your health care provider may recommend a prescription medicine to help prevent HIV infection. If you choose to take medicine to prevent HIV, you should first get tested for HIV. You should then be tested every 3 months for as long as you are taking the medicine. Pregnancy If you are about to stop having your period (premenopausal) and you may become pregnant, seek counseling before you get pregnant. Take 400 to 800 micrograms (mcg) of folic acid every day if you become pregnant. Ask for birth control (contraception) if you want to prevent pregnancy. Osteoporosis and menopause Osteoporosis is a disease in which the bones lose minerals and strength with aging. This can result in bone fractures. If you are 25 years old or older, or if you are at risk for osteoporosis and fractures, ask your health care provider if you should: Be screened for bone loss. Take a calcium or vitamin D supplement to lower your risk of fractures. Be given hormone replacement therapy (HRT) to treat symptoms of menopause. Follow these instructions at home: Alcohol use Do not drink alcohol if: Your health care provider tells you not to drink. You are pregnant, may be pregnant, or are planning to become pregnant. If you drink alcohol: Limit how much you have to: 0-1 drink a day. Know how much alcohol is in your drink. In the [place] one drink equals one 12 oz bottle of beer (355 mL), one 5 oz glass of wine (148  mL), or one 1 oz glass of hard liquor (44 mL). Lifestyle Do not use any products that contain nicotine or tobacco. These products include cigarettes, chewing tobacco, and vaping devices, such as e-cigarettes. If you need help quitting, ask your health care provider. Do not use street drugs. Do not share needles. Ask your health care provider for help if you need support or information about quitting drugs. General instructions Schedule regular health, dental, and eye exams. Stay current with your vaccines. Tell your health care provider if: You often feel depressed. You have ever been abused or do not feel safe at home. Summary Adopting a healthy lifestyle and getting preventive care are important in promoting health and wellness. Follow your health care provider's instructions about healthy diet, exercising, and getting tested or screened for diseases. Follow your health care provider's instructions on monitoring your cholesterol and blood pressure. This information is not intended to replace advice given to you by your  health care provider. Make sure you discuss any questions you have with your health care provider. Document Revised: 06/05/2020 Document Reviewed: 06/05/2020 Elsevier Patient Education  2024 [employer].

## 2022-10-21 ENCOUNTER — Other Ambulatory Visit: Payer: Self-pay | Admitting: Internal Medicine

## 2022-10-21 DIAGNOSIS — Z1231 Encounter for screening mammogram for malignant neoplasm of breast: Secondary | ICD-10-CM

## 2022-11-25 ENCOUNTER — Ambulatory Visit
Admission: RE | Admit: 2022-11-25 | Discharge: 2022-11-25 | Disposition: A | Discharge status: Home / Self Care | Payer: Medicare HMO | Source: Ambulatory Visit | Attending: Internal Medicine | Admitting: Internal Medicine

## 2022-11-25 DIAGNOSIS — Z1231 Encounter for screening mammogram for malignant neoplasm of breast: Secondary | ICD-10-CM

## 2022-11-26 LAB — HM MAMMOGRAPHY

## 2022-11-27 ENCOUNTER — Encounter: Payer: Self-pay | Admitting: Internal Medicine

## 2022-12-14 ENCOUNTER — Other Ambulatory Visit: Payer: Self-pay | Admitting: Internal Medicine

## 2022-12-14 DIAGNOSIS — I10 Essential (primary) hypertension: Secondary | ICD-10-CM

## 2023-03-01 ENCOUNTER — Other Ambulatory Visit: Payer: Self-pay | Admitting: Internal Medicine

## 2023-03-01 DIAGNOSIS — I10 Essential (primary) hypertension: Secondary | ICD-10-CM

## 2023-03-13 ENCOUNTER — Ambulatory Visit (INDEPENDENT_AMBULATORY_CARE_PROVIDER_SITE_OTHER): Payer: Medicare HMO | Admitting: Internal Medicine

## 2023-03-13 VITALS — BP 134/94 | HR 84 | Temp 98.1°F | Wt 153.0 lb

## 2023-03-13 DIAGNOSIS — R7303 Prediabetes: Secondary | ICD-10-CM | POA: Diagnosis not present

## 2023-03-13 DIAGNOSIS — M810 Age-related osteoporosis without current pathological fracture: Secondary | ICD-10-CM | POA: Diagnosis not present

## 2023-03-13 DIAGNOSIS — Z0000 Encounter for general adult medical examination without abnormal findings: Secondary | ICD-10-CM | POA: Diagnosis not present

## 2023-03-13 DIAGNOSIS — Z96659 Presence of unspecified artificial knee joint: Secondary | ICD-10-CM

## 2023-03-13 DIAGNOSIS — T8484XD Pain due to internal orthopedic prosthetic devices, implants and grafts, subsequent encounter: Secondary | ICD-10-CM | POA: Diagnosis not present

## 2023-03-13 DIAGNOSIS — I10 Essential (primary) hypertension: Secondary | ICD-10-CM

## 2023-03-13 LAB — COMPREHENSIVE METABOLIC PANEL
ALT: 20 U/L (ref 0–35)
AST: 22 U/L (ref 0–37)
Albumin: 4.1 g/dL (ref 3.5–5.2)
Alkaline Phosphatase: 55 U/L (ref 39–117)
BUN: 20 mg/dL (ref 6–23)
CO2: 29 meq/L (ref 19–32)
Calcium: 9.1 mg/dL (ref 8.4–10.5)
Chloride: 105 meq/L (ref 96–112)
Creatinine, Ser: 0.92 mg/dL (ref 0.40–1.20)
GFR: 61 mL/min (ref >60.00)
Glucose, Bld: 88 mg/dL (ref 70–99)
Potassium: 4.4 meq/L (ref 3.5–5.1)
Sodium: 140 meq/L (ref 135–145)
Total Bilirubin: 0.9 mg/dL (ref 0.2–1.2)
Total Protein: 7 g/dL (ref 6.0–8.3)

## 2023-03-13 LAB — LIPID PANEL
Cholesterol: 156 mg/dL (ref 0–200)
HDL: 72.8 mg/dL (ref >39.00)
LDL Cholesterol: 74 mg/dL (ref 0–99)
NonHDL: 82.83
Total CHOL/HDL Ratio: 2
Triglycerides: 43 mg/dL (ref 0.0–149.0)
VLDL: 8.6 mg/dL (ref 0.0–40.0)

## 2023-03-13 LAB — CBC
HCT: 37.6 % (ref 36.0–46.0)
Hemoglobin: 12.1 g/dL (ref 12.0–15.0)
MCHC: 32.1 g/dL (ref 30.0–36.0)
MCV: 83.5 fL (ref 78.0–100.0)
Platelets: 314 10*3/uL (ref 150.0–400.0)
RBC: 4.5 Mil/uL (ref 3.87–5.11)
RDW: 15 % (ref 11.5–15.5)
WBC: 7.7 10*3/uL (ref 4.0–10.5)

## 2023-03-13 LAB — HEMOGLOBIN A1C: Hgb A1c MFr Bld: 6.1 % (ref 4.6–6.5)

## 2023-03-13 MED ORDER — HYDROCHLOROTHIAZIDE 25 MG PO TABS: 25.0000 mg | ORAL_TABLET | Freq: Every day | ORAL | 3 refills | Status: AC

## 2023-03-13 MED ORDER — VALSARTAN 80 MG PO TABS: 80.0000 mg | ORAL_TABLET | Freq: Every day | ORAL | 3 refills | Status: DC

## 2023-03-13 NOTE — Progress Notes (Unsigned)
   Subjective:   Patient ID: Kayla Key, female    DOB: 05/18/1945, 78 y.o.   MRN: [id number]  HPI The patient is a 78 YO female coming in for physical no new concerns.  PMH, [facility], social history reviewed and updated  Review of Systems  Constitutional: Negative.   HENT: Negative.    Eyes: Negative.   Respiratory:  Negative for cough, chest tightness and shortness of breath.   Cardiovascular:  Negative for chest pain, palpitations and leg swelling.  Gastrointestinal:  Negative for abdominal distention, abdominal pain, constipation, diarrhea, nausea and vomiting.  Musculoskeletal:  Positive for arthralgias, gait problem and myalgias.  Skin: Negative.   Psychiatric/Behavioral: Negative.      Objective:  Physical Exam Constitutional:      Appearance: She is well-developed.  HENT:     Head: Normocephalic and atraumatic.  Cardiovascular:     Rate and Rhythm: Normal rate and regular rhythm.  Pulmonary:     Effort: Pulmonary effort is normal. No respiratory distress.     Breath sounds: Normal breath sounds. No wheezing or rales.  Abdominal:     General: Bowel sounds are normal. There is no distension.     Palpations: Abdomen is soft.     Tenderness: There is no abdominal tenderness. There is no rebound.  Musculoskeletal:        General: Tenderness present.     Cervical back: Normal range of motion.  Skin:    General: Skin is warm and dry.  Neurological:     Mental Status: She is alert and oriented to person, place, and time.     Coordination: Coordination normal.     Vitals:   03/13/23 1052 03/13/23 1054  BP: (!) 140/100 (!) 140/100  Pulse: 84   Temp: 98.1 F (36.7 C)   TempSrc: Oral   SpO2: 98%   Weight: 153 lb (69.4 kg)     Assessment & Plan:

## 2023-03-14 ENCOUNTER — Encounter: Payer: Self-pay | Admitting: Internal Medicine

## 2023-03-14 NOTE — Assessment & Plan Note (Signed)
Flu shot up to date. Pneumonia complete. Shingrix complete. Tetanus up to date. Colonoscopy due 2026. Mammogram upto date, pap smear aged out and dexa up to date. Counseled about sun safety and mole surveillance. Counseled about the dangers of distracted driving. Given 10 year screening recommendations.

## 2023-03-14 NOTE — Assessment & Plan Note (Signed)
She did not like fosamax and declines further intervention at this time. Due DEXA 2026

## 2023-03-14 NOTE — Assessment & Plan Note (Signed)
Still having persistent pain and walking difficulty. She is using tylenol otc for pain.

## 2023-03-14 NOTE — Assessment & Plan Note (Signed)
BP is elevated and she is out of meds for a few days. Previously at goal on meds. Will continue hydrochlorothiazide 25 mg daily and valsartan 80 mg daily. Checking CMP and adjust as needed.

## 2023-03-14 NOTE — Assessment & Plan Note (Signed)
Checking HGA1c and adjust as needed.

## 2023-03-24 NOTE — Progress Notes (Unsigned)
 [name] [address] [place] [address] Phone: [phone] Subjective:   Morene Antu am a scribe for Dr. Katrinka Blazing.   I'm seeing this patient by the request  of:  Myrlene Broker, MD  CC: knee pain   [id number]  ANAY WALTER is a 78 y.o. female coming in with complaint of B knee pain. Last seen in 2021 for PRP injection. Did see Dr. Denyse Amass later that year as well. Patient states that her knee issues have been a problem since she got them replaced. Left side is worse than the right. Lower leg feels like it wants to give out some times. Had a fall at work one day and the left knee started giving her trouble after that. If she sits too long she gets stiff and it hurts. Tried tylenol and tylenol arthritis, prednisone, topical creams (voltaren and generic). Topical creams just don't seem to work. Patient states that she also has shoulder pain bilaterally but the left side is worse. A replacement was supposed to have been done in the past but patient did not do it.       Past Medical History:  Diagnosis Date   Arthritis    Asthma    GERD (gastroesophageal reflux disease)    Hypertension    MHA (microangiopathic hemolytic anemia) (HCC)    Osteoporosis    Past Surgical History:  Procedure Laterality Date   BUNIONECTOMY Right    KNEE ARTHROSCOPY Right 2009   KNEE ARTHROSCOPY Left 2015   TOE SURGERY Bilateral 2005   TUBAL LIGATION  1988   Social History   Socioeconomic History   Marital status: Single    Spouse name: Not on file   Number of children: Not on file   Years of education: Not on file   Highest education level: Not on file  Occupational History   Not on file  Tobacco Use   Smoking status: Never   Smokeless tobacco: Never  Vaping Use   Vaping status: Never Used  Substance and Sexual Activity   Alcohol use: No    Alcohol/week: 0.0 standard drinks of alcohol   Drug use: No   Sexual activity: Not Currently   Other Topics Concern   Not on file  Social History Narrative   Not on file   Social Drivers of Health   Financial Resource Strain: Low Risk  (07/29/2022)   Overall Financial Resource Strain (CARDIA)    Difficulty of Paying Living Expenses: Not hard at all  Food Insecurity: No Food Insecurity (07/29/2022)   Hunger Vital Sign    Worried About Running Out of Food in the Last Year: Never true    Ran Out of Food in the Last Year: Never true  Transportation Needs: No Transportation Needs (07/29/2022)   PRAPARE - Administrator, Civil Service (Medical): No    Lack of Transportation (Non-Medical): No  Physical Activity: Inactive (07/29/2022)   Exercise Vital Sign    Days of Exercise per Week: 0 days    Minutes of Exercise per Session: 0 min  Stress: No Stress Concern Present (07/29/2022)   [employer] of Occupational Health - Occupational Stress Questionnaire    Feeling of Stress : Not at all  Social Connections: Moderately Integrated (07/29/2022)   Social Connection and Isolation Panel [NHANES]    Frequency of Communication with Friends and Family: More than three times a week    Frequency of Social Gatherings with Friends and Family:  More than three times a week    Attends Religious Services: More than 4 times per year    Active Member of Clubs or Organizations: Yes    Attends Banker Meetings: More than 4 times per year    Marital Status: Widowed   No Known Allergies Family History  Problem Relation Age of Onset   Stroke Father    Hypertension Sister    Breast cancer Sister 28   Cancer Other        breast    Breast cancer Maternal Aunt 75   Breast cancer Cousin 40   Colon cancer Neg Hx      Current Outpatient Medications (Cardiovascular):    hydrochlorothiazide (HYDRODIURIL) 25 MG tablet, Take 1 tablet (25 mg total) by mouth daily.   valsartan (DIOVAN) 80 MG tablet, Take 1 tablet (80 mg total) by mouth daily.     Current Outpatient Medications  (Other):    Multiple Vitamin (MULTI-VITAMIN DAILY PO), Take 1 tablet by mouth daily.   Reviewed prior external information including notes and imaging from  primary care provider As well as notes that were available from care everywhere and other healthcare systems.  Past medical history, social, surgical and family history all reviewed in electronic medical record.  No pertanent information unless stated regarding to the chief complaint.   Review of Systems:  No headache, visual changes, nausea, vomiting, diarrhea, constipation, dizziness, abdominal pain, skin rash, fevers, chills, night sweats, weight loss, swollen lymph nodes, body aches, joint swelling, chest pain, shortness of breath, mood changes. POSITIVE muscle aches  Objective  Blood pressure 110/70, pulse 82, height 5\' 2"  (1.575 m), weight 148 lb 3.2 oz (67.2 kg), SpO2 94%.   General: No apparent distress alert and oriented x3 mood and affect normal, dressed appropriately.  HEENT: Pupils equal, extraocular movements intact  Respiratory: Patient's speak in full sentences and does not appear short of breath  Cardiovascular: No lower extremity edema, non tender, no erythema  Knee exam shows replacements noted bilaterally.  Left knee unfortunately does have some instability noted with valgus and varus force with the audible popping noted.  Patient does have 95 degrees of flexion noted.  Patient does have near full extension but does have the swelling noted in the patellofemoral area.  Limited muscular skeletal ultrasound was performed and interpreted by Antoine Primas, M   Limited ultrasound shows a patient does have hypoechoic changes in the patellofemoral joint space consistent with a small effusion.  No sign of any infectious etiology. Impression: Swelling noted with in the joint replacement.    Impression and Recommendations:     The above documentation has been reviewed and is accurate and complete [name], DO

## 2023-03-25 ENCOUNTER — Ambulatory Visit (INDEPENDENT_AMBULATORY_CARE_PROVIDER_SITE_OTHER): Payer: Medicare HMO

## 2023-03-25 ENCOUNTER — Ambulatory Visit: Payer: Medicare HMO | Admitting: Family Medicine

## 2023-03-25 ENCOUNTER — Encounter: Payer: Self-pay | Admitting: Family Medicine

## 2023-03-25 ENCOUNTER — Other Ambulatory Visit: Payer: Self-pay

## 2023-03-25 VITALS — BP 110/70 | HR 82 | Ht 62.0 in | Wt 148.2 lb

## 2023-03-25 DIAGNOSIS — M25561 Pain in right knee: Secondary | ICD-10-CM | POA: Diagnosis not present

## 2023-03-25 DIAGNOSIS — Z96651 Presence of right artificial knee joint: Secondary | ICD-10-CM | POA: Diagnosis not present

## 2023-03-25 DIAGNOSIS — T8484XD Pain due to internal orthopedic prosthetic devices, implants and grafts, subsequent encounter: Secondary | ICD-10-CM

## 2023-03-25 DIAGNOSIS — M19011 Primary osteoarthritis, right shoulder: Secondary | ICD-10-CM | POA: Diagnosis not present

## 2023-03-25 DIAGNOSIS — M25562 Pain in left knee: Secondary | ICD-10-CM

## 2023-03-25 DIAGNOSIS — Z96653 Presence of artificial knee joint, bilateral: Secondary | ICD-10-CM

## 2023-03-25 DIAGNOSIS — M25512 Pain in left shoulder: Secondary | ICD-10-CM

## 2023-03-25 DIAGNOSIS — M25511 Pain in right shoulder: Secondary | ICD-10-CM

## 2023-03-25 DIAGNOSIS — M2550 Pain in unspecified joint: Secondary | ICD-10-CM | POA: Diagnosis not present

## 2023-03-25 DIAGNOSIS — G8929 Other chronic pain: Secondary | ICD-10-CM | POA: Diagnosis not present

## 2023-03-25 DIAGNOSIS — Z96659 Presence of unspecified artificial knee joint: Secondary | ICD-10-CM | POA: Diagnosis not present

## 2023-03-25 DIAGNOSIS — M25461 Effusion, right knee: Secondary | ICD-10-CM | POA: Diagnosis not present

## 2023-03-25 DIAGNOSIS — M25462 Effusion, left knee: Secondary | ICD-10-CM | POA: Diagnosis not present

## 2023-03-25 DIAGNOSIS — Z96652 Presence of left artificial knee joint: Secondary | ICD-10-CM | POA: Diagnosis not present

## 2023-03-25 LAB — SEDIMENTATION RATE: Sed Rate: 19 mm/h (ref 0–30)

## 2023-03-25 LAB — URIC ACID: Uric Acid, Serum: 5.6 mg/dL (ref 2.4–7.0)

## 2023-03-25 LAB — C-REACTIVE PROTEIN: CRP: <1 mg/dL (ref 0.5–20.0)

## 2023-03-25 NOTE — Assessment & Plan Note (Signed)
 Labs ordered today to rule out any polymyositis that could be contributing, will get x-rays to further evaluate for any arthritic changes.  Given some different stability exercises and proper lifting mechanics.

## 2023-03-25 NOTE — Patient Instructions (Addendum)
 Great to see you. Bone scan at [facility]. Labs Xray of bilateral shoulder Return in 6 to 8 weeks.

## 2023-03-25 NOTE — Assessment & Plan Note (Signed)
 Unfortunately he is having pain and does have some swelling noted on ultrasound.  Will get chromium and cobalt levels, will get a bone scan to see if there is any loosening going on.  Then the discussion will be either bracing or the potential for any revision if we do see some instability.  Other ideas will be potentially Singulair to see if it would be helpful to decrease any other type of reaction patient could be potentially contributing to.  Follow-up with me after the imaging and labs to further evaluate.

## 2023-04-08 ENCOUNTER — Encounter: Payer: Self-pay | Admitting: Family Medicine

## 2023-04-08 LAB — COBALT, SERUM/PLASMA: Cobalt, Serum/Plasma: 0.5 ug/L — ABNORMAL HIGH (ref 0.1–0.4)

## 2023-04-08 LAB — COBALT

## 2023-04-08 LAB — CHROMIUM LEVEL

## 2023-04-08 LAB — CHROMIUM, SERUM: Chromium, Serum: 0.6 ug/L (ref <=1.4)

## 2023-04-14 ENCOUNTER — Encounter: Payer: Self-pay | Admitting: Family Medicine

## 2023-04-15 ENCOUNTER — Other Ambulatory Visit (HOSPITAL_COMMUNITY)

## 2023-04-15 ENCOUNTER — Encounter (HOSPITAL_COMMUNITY)

## 2023-04-17 ENCOUNTER — Encounter (HOSPITAL_COMMUNITY)
Admission: RE | Admit: 2023-04-17 | Discharge: 2023-04-17 | Disposition: A | Discharge status: Home / Self Care | Source: Ambulatory Visit | Attending: Family Medicine | Admitting: Family Medicine

## 2023-04-17 DIAGNOSIS — Z96653 Presence of artificial knee joint, bilateral: Secondary | ICD-10-CM | POA: Insufficient documentation

## 2023-04-17 DIAGNOSIS — M25569 Pain in unspecified knee: Secondary | ICD-10-CM | POA: Diagnosis not present

## 2023-04-17 MED ORDER — TECHNETIUM TC 99M MEDRONATE IV KIT
20.9000 | PACK | Freq: Once | INTRAVENOUS | Status: AC | PRN
  Administered 2023-04-17: 20.9 via INTRAVENOUS

## 2023-04-19 DIAGNOSIS — H2513 Age-related nuclear cataract, bilateral: Secondary | ICD-10-CM | POA: Diagnosis not present

## 2023-04-28 ENCOUNTER — Encounter: Payer: Self-pay | Admitting: Family Medicine

## 2023-05-07 NOTE — Progress Notes (Unsigned)
 [name] [address] [place] [address] Phone: [phone] Subjective:   Kayla Key, am serving as a scribe for Dr. Antoine Primas. I'm seeing this patient by the request  of:  Myrlene Broker, MD  CC: Bilateral shoulder and bilateral knee pain  [id number]  03/25/2023 Labs ordered today to rule out any polymyositis that could be contributing, will get x-rays to further evaluate for any arthritic changes.  Given some different stability exercises and proper lifting mechanics     Unfortunately he is having pain and does have some swelling noted on ultrasound.  Will get chromium and cobalt levels, will get a bone scan to see if there is any loosening going on.  Then the discussion will be either bracing or the potential for any revision if we do see some instability.  Other ideas will be potentially Singulair to see if it would be helpful to decrease any other type of reaction patient could be potentially contributing to.  Follow-up with me after the imaging and labs to further evaluate.     Update 05/08/2023 Kayla Key is a 78 y.o. female coming in with complaint of B shoulder and B knee pain. Patient states that her shoulder and knee pain is unchanged since last visit.   Bone scan 04/17/2023 IMPRESSION: Hyperemia and asymmetrically increased delayed uptake of radiotracer surround right knee arthroplasty components which is nonspecific, but can be seen the setting loosening and/or prosthetic infection/septic arthritis.     Past Medical History:  Diagnosis Date   Arthritis    Asthma    GERD (gastroesophageal reflux disease)    Hypertension    MHA (microangiopathic hemolytic anemia) (HCC)    Osteoporosis    Past Surgical History:  Procedure Laterality Date   BUNIONECTOMY Right    KNEE ARTHROSCOPY Right 2009   KNEE ARTHROSCOPY Left 2015   TOE SURGERY Bilateral 2005   TUBAL LIGATION  1988   Social History    Socioeconomic History   Marital status: Single    Spouse name: Not on file   Number of children: Not on file   Years of education: Not on file   Highest education level: Not on file  Occupational History   Not on file  Tobacco Use   Smoking status: Never   Smokeless tobacco: Never  Vaping Use   Vaping status: Never Used  Substance and Sexual Activity   Alcohol use: No    Alcohol/week: 0.0 standard drinks of alcohol   Drug use: No   Sexual activity: Not Currently  Other Topics Concern   Not on file  Social History Narrative   Not on file   Social Drivers of Health   Financial Resource Strain: Low Risk  (07/29/2022)   Overall Financial Resource Strain (CARDIA)    Difficulty of Paying Living Expenses: Not hard at all  Food Insecurity: No Food Insecurity (07/29/2022)   Hunger Vital Sign    Worried About Running Out of Food in the Last Year: Never true    Ran Out of Food in the Last Year: Never true  Transportation Needs: No Transportation Needs (07/29/2022)   PRAPARE - Administrator, Civil Service (Medical): No    Lack of Transportation (Non-Medical): No  Physical Activity: Inactive (07/29/2022)   Exercise Vital Sign    Days of Exercise per Week: 0 days    Minutes of Exercise per Session: 0 min  Stress: No Stress Concern Present (07/29/2022)  [employer] of Occupational Health - Occupational Stress Questionnaire    Feeling of Stress : Not at all  Social Connections: Moderately Integrated (07/29/2022)   Social Connection and Isolation Panel [NHANES]    Frequency of Communication with Friends and Family: More than three times a week    Frequency of Social Gatherings with Friends and Family: More than three times a week    Attends Religious Services: More than 4 times per year    Active Member of [employer] or Organizations: Yes    Attends Banker Meetings: More than 4 times per year    Marital Status: Widowed   No Known Allergies Family History   Problem Relation Age of Onset   Stroke Father    Hypertension Sister    Breast cancer Sister 9   Cancer Other        breast    Breast cancer Maternal Aunt 69   Breast cancer Cousin 40   Colon cancer Neg Hx      Current Outpatient Medications (Cardiovascular):    hydrochlorothiazide (HYDRODIURIL) 25 MG tablet, Take 1 tablet (25 mg total) by mouth daily.   valsartan (DIOVAN) 80 MG tablet, Take 1 tablet (80 mg total) by mouth daily.     Current Outpatient Medications (Other):    Multiple Vitamin (MULTI-VITAMIN DAILY PO), Take 1 tablet by mouth daily.   Reviewed prior external information including notes and imaging from  primary care provider As well as notes that were available from care everywhere and other healthcare systems.  Past medical history, social, surgical and family history all reviewed in electronic medical record.  No pertanent information unless stated regarding to the chief complaint.   Review of Systems:  No headache, visual changes, nausea, vomiting, diarrhea, constipation, dizziness, abdominal pain, skin rash, fevers, chills, night sweats, weight loss, swollen lymph nodes, body aches, joint swelling, chest pain, shortness of breath, mood changes. POSITIVE muscle aches  Objective  Blood pressure 128/84, height 5\' 2"  (1.575 m), weight 151 lb (68.5 kg).   General: No apparent distress alert and oriented x3 mood and affect normal, dressed appropriately.  HEENT: Pupils equal, extraocular movements intact  Respiratory: Patient's speak in full sentences and does not appear short of breath  Cardiovascular: No lower extremity edema, non tender, no erythema  Bilateral shoulders show weakness with 3 out of 5 strength.  No crepitus noted with range of motion.  Tightness noted. Patient's right knee does have effusion noted.  Instability with valgus and varus force noted.  Patient does have tightness with certain range of motion as well.   Procedure: Real-time  Ultrasound Guided Injection of right glenohumeral joint Device: GE Logiq Q7  Ultrasound guided injection is preferred based studies that show increased duration, increased effect, greater accuracy, decreased procedural pain, increased response rate with ultrasound guided versus blind injection.  Verbal informed consent obtained.  Time-out conducted.  Noted no overlying erythema, induration, or other signs of local infection.  Skin prepped in a sterile fashion.  Local anesthesia: Topical Ethyl chloride.  With sterile technique and under real time ultrasound guidance:  Joint visualized.  23g 1  inch needle inserted posterior approach. Pictures taken for needle placement. Patient did have injection of 2 cc of 1% lidocaine, 2 cc of 0.5% Marcaine, and 1.0 cc of Kenalog 40 mg/dL. Completed without difficulty  Pain immediately resolved suggesting accurate placement of the medication.  Advised to call if fevers/chills, erythema, induration, drainage, or persistent bleeding.  Impression: Technically successful ultrasound guided  injection.  Procedure: Real-time Ultrasound Guided Injection of left glenohumeral joint Device: GE Logiq E  Ultrasound guided injection is preferred based studies that show increased duration, increased effect, greater accuracy, decreased procedural pain, increased response rate with ultrasound guided versus blind injection.  Verbal informed consent obtained.  Time-out conducted.  Noted no overlying erythema, induration, or other signs of local infection.  Skin prepped in a sterile fashion.  Local anesthesia: Topical Ethyl chloride.  With sterile technique and under real time ultrasound guidance:  Joint visualized.  21g 2 inch needle inserted posterior approach. Pictures taken for needle placement. Patient did have injection of 2 cc of 0.5% Marcaine, and 1cc of Kenalog 40 mg/dL. Completed without difficulty  Pain immediately resolved suggesting accurate placement of the  medication.  Advised to call if fevers/chills, erythema, induration, drainage, or persistent bleeding.  Images permanently stored  Impression: Technically successful ultrasound guided injection.  Procedure: Real-time Ultrasound Guided Injection of right glenohumeral joint Device: GE Logiq Q7  Ultrasound guided injection is preferred based studies that show increased duration, increased effect, greater accuracy, decreased procedural pain, increased response rate with ultrasound guided versus blind injection.  Verbal informed consent obtained.  Time-out conducted.  Noted no overlying erythema, induration, or other signs of local infection.  Skin prepped in a sterile fashion.  Local anesthesia: Topical Ethyl chloride.  With sterile technique and under real time ultrasound guidance:  Joint visualized.  23g 1  inch needle inserted posterior approach. Pictures taken for needle placement. Patient did have injection of 2 cc of 1% lidocaine, 2 cc of 0.5% Marcaine, and 1.0 cc of Kenalog 40 mg/dL. Completed without difficulty  Pain immediately resolved suggesting accurate placement of the medication.  Images saved Advised to call if fevers/chills, erythema, induration, drainage, or persistent bleeding.  Impression: Technically successful ultrasound guided injection.   Impression and Recommendations:     The above documentation has been reviewed and is accurate and complete [name], DO

## 2023-05-08 ENCOUNTER — Other Ambulatory Visit: Payer: Self-pay

## 2023-05-08 ENCOUNTER — Ambulatory Visit: Payer: Medicare HMO | Admitting: Family Medicine

## 2023-05-08 ENCOUNTER — Encounter: Payer: Self-pay | Admitting: Family Medicine

## 2023-05-08 ENCOUNTER — Ambulatory Visit (INDEPENDENT_AMBULATORY_CARE_PROVIDER_SITE_OTHER)

## 2023-05-08 VITALS — BP 128/84 | Ht 62.0 in | Wt 151.0 lb

## 2023-05-08 DIAGNOSIS — T8484XD Pain due to internal orthopedic prosthetic devices, implants and grafts, subsequent encounter: Secondary | ICD-10-CM

## 2023-05-08 DIAGNOSIS — M25511 Pain in right shoulder: Secondary | ICD-10-CM | POA: Diagnosis not present

## 2023-05-08 DIAGNOSIS — Z96659 Presence of unspecified artificial knee joint: Secondary | ICD-10-CM | POA: Diagnosis not present

## 2023-05-08 DIAGNOSIS — M545 Low back pain, unspecified: Secondary | ICD-10-CM

## 2023-05-08 DIAGNOSIS — M5030 Other cervical disc degeneration, unspecified cervical region: Secondary | ICD-10-CM | POA: Diagnosis not present

## 2023-05-08 DIAGNOSIS — M47812 Spondylosis without myelopathy or radiculopathy, cervical region: Secondary | ICD-10-CM | POA: Diagnosis not present

## 2023-05-08 DIAGNOSIS — M47816 Spondylosis without myelopathy or radiculopathy, lumbar region: Secondary | ICD-10-CM | POA: Diagnosis not present

## 2023-05-08 DIAGNOSIS — G8929 Other chronic pain: Secondary | ICD-10-CM

## 2023-05-08 DIAGNOSIS — M25512 Pain in left shoulder: Secondary | ICD-10-CM

## 2023-05-08 DIAGNOSIS — M542 Cervicalgia: Secondary | ICD-10-CM | POA: Diagnosis not present

## 2023-05-08 DIAGNOSIS — M439 Deforming dorsopathy, unspecified: Secondary | ICD-10-CM | POA: Diagnosis not present

## 2023-05-08 NOTE — Patient Instructions (Addendum)
 Good to see you. Injections today.  Return in 3 months

## 2023-05-08 NOTE — Assessment & Plan Note (Signed)
 Bilateral shoulder seems to be more of rotator cuff arthropathy bilaterally.  Does have weakness noted.  Laboratory workup did not show anything that is consistent with the polymyositis.  Discussed with patient about icing regimen and home exercises otherwise.  I given injections today and tolerated the procedure well with some improvement in range of motion.  Patient would want to hold on any type of surgical intervention so do not feel that advanced imaging is warranted.  Follow-up again

## 2023-05-08 NOTE — Assessment & Plan Note (Addendum)
 Patient does have instability noted of the knee.  Has replacements bilaterally but the right 1 on the bone scan shows that patient does have instability.  Still wants to avoid any type of surgical intervention.  Will get OA stability braces.  Patient is ambulatory with an abnormal thigh to calf ratio noted.  Patient is in agreement with the plan.  Do feel that the OA stability braces on both knees would be beneficial with the amount of discomfort she is having.  Will see if we can get her fitted appropriately.

## 2023-05-13 ENCOUNTER — Encounter: Payer: Self-pay | Admitting: Family Medicine

## 2023-05-20 ENCOUNTER — Encounter: Payer: Self-pay | Admitting: Family Medicine

## 2023-07-23 DIAGNOSIS — R69 Illness, unspecified: Secondary | ICD-10-CM | POA: Diagnosis not present

## 2023-07-28 NOTE — Progress Notes (Signed)
 [facility] [address] [place] [address] Phone: [phone] Subjective:   LILLETTE Claretha Schimke am a scribe for Dr. Claudene and Dr. Leonce.   I'm seeing this patient by the request  of:  Rollene Almarie LABOR, MD  CC: Bilateral knee and instability of the right knee  [id number]  05/08/2023 Patient does have instability noted of the knee.  Has replacements bilaterally but the right 1 on the bone scan shows that patient does have instability.  Still wants to avoid any type of surgical intervention.  Will get OA stability braces.  Patient is ambulatory with an abnormal thigh to calf ratio noted.  Patient is in agreement with the plan.  Do feel that the OA stability braces on both knees would be beneficial with the amount of discomfort she is having.  Will see if we can get her fitted appropriately.     Bilateral shoulder seems to be more of rotator cuff arthropathy bilaterally.  Does have weakness noted.  Laboratory workup did not show anything that is consistent with the polymyositis.  Discussed with patient about icing regimen and home exercises otherwise.  I given injections today and tolerated the procedure well with some improvement in range of motion.  Patient would want to hold on any type of surgical intervention so do not feel that advanced imaging is warranted.  Follow-up again     Update 08/07/2023 NANCI LAKATOS is a 78 y.o. female coming in with complaint of B shoulder and B knee pain. Hx of B TKR. Patient states shoulder are ok. The knees probably never get better. They hurt and stay swollen all the time.        Past Medical History:  Diagnosis Date   Arthritis    Asthma    GERD (gastroesophageal reflux disease)    Hypertension    MHA (microangiopathic hemolytic anemia) (HCC)    Osteoporosis    Past Surgical History:  Procedure Laterality Date   BUNIONECTOMY Right    KNEE ARTHROSCOPY Right 2009   KNEE ARTHROSCOPY Left 2015    TOE SURGERY Bilateral 2005   TUBAL LIGATION  1988   Social History   Socioeconomic History   Marital status: Single    Spouse name: Not on file   Number of children: Not on file   Years of education: Not on file   Highest education level: Not on file  Occupational History   Not on file  Tobacco Use   Smoking status: Never   Smokeless tobacco: Never  Vaping Use   Vaping status: Never Used  Substance and Sexual Activity   Alcohol use: No    Alcohol/week: 0.0 standard drinks of alcohol   Drug use: No   Sexual activity: Not Currently  Other Topics Concern   Not on file  Social History Narrative   Not on file   Social Drivers of Health   Financial Resource Strain: Low Risk  (07/29/2022)   Overall Financial Resource Strain (CARDIA)    Difficulty of Paying Living Expenses: Not hard at all  Food Insecurity: No Food Insecurity (07/29/2022)   Hunger Vital Sign    Worried About Running Out of Food in the Last Year: Never true    Ran Out of Food in the Last Year: Never true  Transportation Needs: No Transportation Needs (07/29/2022)   PRAPARE - Administrator, Civil Service (Medical): No    Lack of Transportation (Non-Medical): No  Physical Activity: Inactive (07/29/2022)  Exercise Vital Sign    Days of Exercise per Week: 0 days    Minutes of Exercise per Session: 0 min  Stress: No Stress Concern Present (07/29/2022)   [employer] of Occupational Health - Occupational Stress Questionnaire    Feeling of Stress : Not at all  Social Connections: Moderately Integrated (07/29/2022)   Social Connection and Isolation Panel    Frequency of Communication with Friends and Family: More than three times a week    Frequency of Social Gatherings with Friends and Family: More than three times a week    Attends Religious Services: More than 4 times per year    Active Member of [employer] or Organizations: Yes    Attends Banker Meetings: More than 4 times per year    Marital  Status: Widowed   No Known Allergies Family History  Problem Relation Age of Onset   Stroke Father    Hypertension Sister    Breast cancer Sister 52   Cancer Other        breast    Breast cancer Maternal Aunt 32   Breast cancer Cousin 40   Colon cancer Neg Hx      Current Outpatient Medications (Cardiovascular):    hydrochlorothiazide  (HYDRODIURIL ) 25 MG tablet, Take 1 tablet (25 mg total) by mouth daily.   valsartan  (DIOVAN ) 80 MG tablet, Take 1 tablet (80 mg total) by mouth daily.     Current Outpatient Medications (Other):    DULoxetine  (CYMBALTA ) 20 MG capsule, Take 1 capsule (20 mg total) by mouth daily.   Multiple Vitamin (MULTI-VITAMIN DAILY PO), Take 1 tablet by mouth daily.   Reviewed prior external information including notes and imaging from  primary care provider As well as notes that were available from care everywhere and other healthcare systems.  Past medical history, social, surgical and family history all reviewed in electronic medical record.  No pertanent information unless stated regarding to the chief complaint.   Review of Systems:  No headache, visual changes, nausea, vomiting, diarrhea, constipation, dizziness, abdominal pain, skin rash, fevers, chills, night sweats, weight loss, swollen lymph nodes, body aches, joint swelling, chest pain, shortness of breath, mood changes. POSITIVE muscle aches  Objective  Blood pressure 112/70, pulse (!) 59, height 5' 2 (1.575 m), weight 149 lb 6.4 oz (67.8 kg), SpO2 (!) 89%.   General: No apparent distress alert and oriented x3 mood and affect normal, dressed appropriately.  HEENT: Pupils equal, extraocular movements intact  Respiratory: Patient's speak in full sentences and does not appear short of breath  Cardiovascular: No lower extremity edema, non tender, no erythema  Antalgic gait noted.  Severe tenderness over the right knee with a larger effusion than the left but small effusion of the left knee noted.   Bilateral shoulders do have some atrophy noted.  Some difficulty going from seated to standing position.    Impression and Recommendations:     The above documentation has been reviewed and is accurate and complete Daphane Odekirk M Aithan Farrelly, DO

## 2023-08-07 ENCOUNTER — Ambulatory Visit: Admitting: Family Medicine

## 2023-08-07 ENCOUNTER — Ambulatory Visit

## 2023-08-07 VITALS — BP 112/70 | HR 59 | Ht 62.0 in | Wt 149.4 lb

## 2023-08-07 DIAGNOSIS — M25511 Pain in right shoulder: Secondary | ICD-10-CM

## 2023-08-07 DIAGNOSIS — G8929 Other chronic pain: Secondary | ICD-10-CM | POA: Diagnosis not present

## 2023-08-07 DIAGNOSIS — M25512 Pain in left shoulder: Secondary | ICD-10-CM

## 2023-08-07 DIAGNOSIS — T8484XD Pain due to internal orthopedic prosthetic devices, implants and grafts, subsequent encounter: Secondary | ICD-10-CM | POA: Diagnosis not present

## 2023-08-07 DIAGNOSIS — Z96659 Presence of unspecified artificial knee joint: Secondary | ICD-10-CM

## 2023-08-07 MED ORDER — DULOXETINE HCL 20 MG PO CPEP: 20.0000 mg | ORAL_CAPSULE | Freq: Every day | ORAL | 0 refills | Status: DC

## 2023-08-07 NOTE — Patient Instructions (Signed)
 Cymbalta  20 mg. Send an update in 2 to 3 weeks.  See me again in 2 to 3 months.

## 2023-08-07 NOTE — Assessment & Plan Note (Signed)
 Patient has multiple joint complaints.  Does have osteoporosis and osteoarthritic changes in multiple areas.  Discussed with patient about icing regimen and home exercise avoid.  Increase activity slowly otherwise.  Discussed icing regimen.  Follow-up again 2 months.  Started on Cymbalta .  20 mg daily.

## 2023-08-07 NOTE — Assessment & Plan Note (Signed)
 Instability of the right knee noted.  Patient does not want to do any surgical intervention.  Was able to do bracing for a while but finds it uncomfortable now.  Follow-up again in 6 to 8 weeks otherwise

## 2023-08-29 ENCOUNTER — Other Ambulatory Visit: Payer: Self-pay | Admitting: Family Medicine

## 2023-10-07 NOTE — Progress Notes (Unsigned)
 [facility] [address] [place] [address] Phone: [phone] Subjective:   Kayla Key Kayla Key, am serving as a scribe for Dr. Arthea Claudene.  I'm seeing this patient by the request  of:  Rollene Almarie LABOR, MD  CC: Bilateral shoulder pain  YEP:Dlagzrupcz  08/07/2023 Instability of the right knee noted.  Patient does not want to do any surgical intervention.  Was able to do bracing for a while but finds it uncomfortable now.  Follow-up again in 6 to 8 weeks otherwise     Patient has multiple joint complaints.  Does have osteoporosis and osteoarthritic changes in multiple areas.  Discussed with patient about icing regimen and home exercise avoid.  Increase activity slowly otherwise.  Discussed icing regimen.  Follow-up again 2 months.  Started on Cymbalta .  20 mg daily.     Update 10/08/2023 Kayla Key is a 78 y.o. female coming in with complaint of B knee and B shoulder pain. Hx of B TKR. Started on Cymbalta . Patient states that her knees are better. Shoulder pain is ongoing. R>L. Pain over superior and anterior shoulder. Took prednisone  last week which helped her pain.        Past Medical History:  Diagnosis Date   Arthritis    Asthma    GERD (gastroesophageal reflux disease)    Hypertension    MHA (microangiopathic hemolytic anemia) (HCC)    Osteoporosis    Past Surgical History:  Procedure Laterality Date   BUNIONECTOMY Right    KNEE ARTHROSCOPY Right 2009   KNEE ARTHROSCOPY Left 2015   TOE SURGERY Bilateral 2005   TUBAL LIGATION  1988   Social History   Socioeconomic History   Marital status: Single    Spouse name: Not on file   Number of children: Not on file   Years of education: Not on file   Highest education level: Not on file  Occupational History   Not on file  Tobacco Use   Smoking status: Never   Smokeless tobacco: Never  Vaping Use   Vaping status: Never Used  Substance and Sexual Activity   Alcohol  use: No    Alcohol/week: 0.0 standard drinks of alcohol   Drug use: No   Sexual activity: Not Currently  Other Topics Concern   Not on file  Social History Narrative   Not on file   Social Drivers of Health   Financial Resource Strain: Low Risk  (07/29/2022)   Overall Financial Resource Strain (CARDIA)    Difficulty of Paying Living Expenses: Not hard at all  Food Insecurity: No Food Insecurity (07/29/2022)   Hunger Vital Sign    Worried About Running Out of Food in the Last Year: Never true    Ran Out of Food in the Last Year: Never true  Transportation Needs: No Transportation Needs (07/29/2022)   PRAPARE - Administrator, Civil Service (Medical): No    Lack of Transportation (Non-Medical): No  Physical Activity: Inactive (07/29/2022)   Exercise Vital Sign    Days of Exercise per Week: 0 days    Minutes of Exercise per Session: 0 min  Stress: No Stress Concern Present (07/29/2022)   [employer] of Occupational Health - Occupational Stress Questionnaire    Feeling of Stress : Not at all  Social Connections: Moderately Integrated (07/29/2022)   Social Connection and Isolation Panel    Frequency of Communication with Friends and Family: More than three times a week  Frequency of Social Gatherings with Friends and Family: More than three times a week    Attends Religious Services: More than 4 times per year    Active Member of Clubs or Organizations: Yes    Attends Banker Meetings: More than 4 times per year    Marital Status: Widowed   No Known Allergies Family History  Problem Relation Age of Onset   Stroke Father    Hypertension Sister    Breast cancer Sister 57   Cancer Other        breast    Breast cancer Maternal Aunt 5   Breast cancer Cousin 40   Colon cancer Neg Hx      Current Outpatient Medications (Cardiovascular):    hydrochlorothiazide  (HYDRODIURIL ) 25 MG tablet, Take 1 tablet (25 mg total) by mouth daily.   valsartan  (DIOVAN ) 80  MG tablet, Take 1 tablet (80 mg total) by mouth daily.     Current Outpatient Medications (Other):    DULoxetine  (CYMBALTA ) 30 MG capsule, Take 1 capsule (30 mg total) by mouth daily.   Multiple Vitamin (MULTI-VITAMIN DAILY PO), Take 1 tablet by mouth daily.   Reviewed prior external information including notes and imaging from  primary care provider As well as notes that were available from care everywhere and other healthcare systems.  Past medical history, social, surgical and family history all reviewed in electronic medical record.  No pertanent information unless stated regarding to the chief complaint.   Review of Systems:  No headache, visual changes, nausea, vomiting, diarrhea, constipation, dizziness, abdominal pain, skin rash, fevers, chills, night sweats, weight loss, swollen lymph nodes, body aches, joint swelling, chest pain, shortness of breath, mood changes. POSITIVE muscle aches  Objective  Blood pressure 112/82, pulse 64, height 5' 2 (1.575 m), SpO2 98%.   General: No apparent distress alert and oriented x3 mood and affect normal, dressed appropriately.  HEENT: Pupils equal, extraocular movements intact  Respiratory: Patient's speak in full sentences and does not appear short of breath  Cardiovascular: No lower extremity edema, non tender, no erythema  Arthritic changes in multiple joints.  Patient has positive crossover of the shoulders bilaterally.  No tightness noted.  Procedure: Real-time Ultrasound Guided Injection of right acromioclavicular joint Device: GE Logiq Q7 Ultrasound guided injection is preferred based studies that show increased duration, increased effect, greater accuracy, decreased procedural pain, increased response rate, and decreased cost with ultrasound guided versus blind injection.  Verbal informed consent obtained.  Time-out conducted.  Noted no overlying erythema, induration, or other signs of local infection.  Skin prepped in a sterile  fashion.  Local anesthesia: Topical Ethyl chloride.  With sterile technique and under real time ultrasound guidance: With a 25-gauge half inch needle injected with 0.5 cc of 0.5% Marcaine  and 0.5 cc of Kenalog 40 mg/mL Completed without difficulty  Pain immediately resolved suggesting accurate placement of the medication.  Advised to call if fevers/chills, erythema, induration, drainage, or persistent bleeding.  Images saved Impression: Technically successful ultrasound guided injection.  Procedure: Real-time Ultrasound Guided Injection of left acromioclavicular joint Device: GE Logiq Q7 Ultrasound guided injection is preferred based studies that show increased duration, increased effect, greater accuracy, decreased procedural pain, increased response rate, and decreased cost with ultrasound guided versus blind injection.  Verbal informed consent obtained.  Time-out conducted.  Noted no overlying erythema, induration, or other signs of local infection.  Skin prepped in a sterile fashion.  Local anesthesia: Topical Ethyl chloride.  With sterile technique and  under real time ultrasound guidance: With a 25-gauge half inch needle injected with 0.5 cc of 0.5% Marcaine  and 0.5 cc of Kenalog 40 mg/mL Completed without difficulty  Pain immediately resolved suggesting accurate placement of the medication.  Advised to call if fevers/chills, erythema, induration, drainage, or persistent bleeding.  Impression: Technically successful ultrasound guided injection.    Impression and Recommendations:    The above documentation has been reviewed and is accurate and complete Jaedon Siler M Loy Little, DO

## 2023-10-08 ENCOUNTER — Other Ambulatory Visit: Payer: Self-pay

## 2023-10-08 ENCOUNTER — Encounter: Payer: Self-pay | Admitting: Family Medicine

## 2023-10-08 ENCOUNTER — Ambulatory Visit: Admitting: Family Medicine

## 2023-10-08 VITALS — BP 112/82 | HR 64 | Ht 62.0 in

## 2023-10-08 DIAGNOSIS — G8929 Other chronic pain: Secondary | ICD-10-CM | POA: Diagnosis not present

## 2023-10-08 DIAGNOSIS — Z96659 Presence of unspecified artificial knee joint: Secondary | ICD-10-CM | POA: Diagnosis not present

## 2023-10-08 DIAGNOSIS — M19012 Primary osteoarthritis, left shoulder: Secondary | ICD-10-CM

## 2023-10-08 DIAGNOSIS — M19019 Primary osteoarthritis, unspecified shoulder: Secondary | ICD-10-CM | POA: Insufficient documentation

## 2023-10-08 DIAGNOSIS — T8484XD Pain due to internal orthopedic prosthetic devices, implants and grafts, subsequent encounter: Secondary | ICD-10-CM

## 2023-10-08 DIAGNOSIS — M25511 Pain in right shoulder: Secondary | ICD-10-CM

## 2023-10-08 DIAGNOSIS — M25512 Pain in left shoulder: Secondary | ICD-10-CM

## 2023-10-08 DIAGNOSIS — M19011 Primary osteoarthritis, right shoulder: Secondary | ICD-10-CM

## 2023-10-08 MED ORDER — DULOXETINE HCL 30 MG PO CPEP: 30.0000 mg | ORAL_CAPSULE | Freq: Every day | ORAL | 0 refills | Status: DC

## 2023-10-08 NOTE — Assessment & Plan Note (Addendum)
 Bilateral injections noted.  Discussed icing regimen and home exercises, discussed which activities to do and which ones to avoid.  Increase activity slowly.  Discussed icing regimen.  Follow-up again in 12 weeks started Cymbalta  30 mg for her other arthritic changes.  Hopeful that patient will make improvement.

## 2023-10-08 NOTE — Patient Instructions (Signed)
 Injected both AC joints today See me in 10-12 weeks

## 2023-10-08 NOTE — Assessment & Plan Note (Signed)
 Cymbalta  as well for the chronic pain after the replacement.

## 2023-10-24 ENCOUNTER — Other Ambulatory Visit: Payer: Self-pay | Admitting: Internal Medicine

## 2023-10-24 DIAGNOSIS — Z0000 Encounter for general adult medical examination without abnormal findings: Secondary | ICD-10-CM

## 2023-10-30 ENCOUNTER — Other Ambulatory Visit: Payer: Self-pay | Admitting: Family Medicine

## 2023-11-01 ENCOUNTER — Encounter: Payer: Self-pay | Admitting: Emergency Medicine

## 2023-11-01 ENCOUNTER — Ambulatory Visit (INDEPENDENT_AMBULATORY_CARE_PROVIDER_SITE_OTHER)

## 2023-11-01 ENCOUNTER — Ambulatory Visit: Admission: EM | Admit: 2023-11-01 | Discharge: 2023-11-01 | Disposition: A | Discharge status: Home / Self Care

## 2023-11-01 DIAGNOSIS — R051 Acute cough: Secondary | ICD-10-CM

## 2023-11-01 DIAGNOSIS — J206 Acute bronchitis due to rhinovirus: Secondary | ICD-10-CM

## 2023-11-01 DIAGNOSIS — R059 Cough, unspecified: Secondary | ICD-10-CM | POA: Diagnosis not present

## 2023-11-01 DIAGNOSIS — R002 Palpitations: Secondary | ICD-10-CM

## 2023-11-01 DIAGNOSIS — R062 Wheezing: Secondary | ICD-10-CM | POA: Diagnosis not present

## 2023-11-01 MED ORDER — METHYLPREDNISOLONE 4 MG PO TBPK: ORAL_TABLET | ORAL | 0 refills | Status: DC

## 2023-11-01 MED ORDER — BENZONATATE 100 MG PO CAPS: 100.0000 mg | ORAL_CAPSULE | Freq: Three times a day (TID) | ORAL | 0 refills | Status: DC | PRN

## 2023-11-01 MED ORDER — MUCINEX DM MAXIMUM STRENGTH 60-1200 MG PO TB12: 1.0000 | ORAL_TABLET | Freq: Two times a day (BID) | ORAL | 0 refills | Status: AC

## 2023-11-01 MED ORDER — IPRATROPIUM-ALBUTEROL 0.5-2.5 (3) MG/3ML IN SOLN
3.0000 mL | Freq: Once | RESPIRATORY_TRACT | Status: AC
  Administered 2023-11-01: 3 mL via RESPIRATORY_TRACT

## 2023-11-01 MED ORDER — ALBUTEROL SULFATE HFA 108 (90 BASE) MCG/ACT IN AERS: 2.0000 | INHALATION_SPRAY | RESPIRATORY_TRACT | 0 refills | Status: AC | PRN

## 2023-11-01 MED ORDER — DOXYCYCLINE HYCLATE 100 MG PO CAPS: 100.0000 mg | ORAL_CAPSULE | Freq: Two times a day (BID) | ORAL | 0 refills | Status: DC

## 2023-11-01 NOTE — ED Provider Notes (Signed)
 [facility]    CSN: [id number] Arrival date & time: 11/01/23  9080      History   Chief Complaint Chief Complaint  Patient presents with   Cough   Nasal Congestion   Sore Throat    HPI POET Kayla Key is a 78 y.o. female.   Discussed the use of AI scribe software for clinical note transcription with the patient, who gave verbal consent to proceed.   The patient presents on approximately day 12 of respiratory symptoms, most notably a persistent congested cough. She reports that her sore throat has improved, but the cough has remained significant and is non-productive despite feeling congested. She denies shortness of breath but notes occasional wheezing. Earlier in the illness she experienced rhinorrhea, sneezing, and coughing together, though sneezing has since resolved. She denies nausea, vomiting, diarrhea, or headache. She has been using over-the-counter cough medications with little relief. The patient denies any history of smoking or vaping.  Additionally, she reports experiencing heart palpitations described as a fluttering sensation for the past two weeks. She mainly notices these episodes at night while lying down and sometimes feels the fluttering in her throat. She denies chest pain, dizziness, numbness, tingling, or swelling in the feet, legs, or ankles. She states this is not the reason for her visit today but mentioned it for evaluation while here.  The following sections of the patient's history were reviewed and updated as appropriate: allergies, current medications, past family history, past medical history, past social history, past surgical history, and problem list.      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      Home Medications    Prior to Admission medications   Medication Sig Start Date End Date Taking? Authorizing Provider  albuterol (VENTOLIN HFA) 108 (90 Base) MCG/ACT inhaler Inhale 2 puffs into the lungs every 4 (four) hours as needed for wheezing or shortness of breath (bronchospasms). 11/01/23  Yes Iola Lukes, FNP  benzonatate  (TESSALON ) 100 MG capsule Take 1 capsule (100 mg total) by mouth 3 (three) times daily as needed for cough. 11/01/23  Yes Patrica Mendell, Lukes, FNP  Dextromethorphan-guaiFENesin (MUCINEX DM MAXIMUM STRENGTH) 60-1200 MG TB12 Take 1 tablet by mouth 2 (two) times daily. 11/01/23  Yes Iola Lukes, FNP  doxycycline  (VIBRAMYCIN ) 100 MG capsule Take 1 capsule (100 mg total) by mouth 2 (two) times daily. 11/01/23  Yes Iola Lukes, FNP  methylPREDNISolone  (MEDROL  DOSEPAK) 4 MG TBPK tablet Take as directed 11/01/23  Yes Kao Berkheimer, Lukes, FNP  STATUS COVID-19/FLU A&B KIT TEST AS DIRECTED TODAY 10/26/23  Yes [provider]  DULoxetine  (CYMBALTA ) 30 MG capsule TAKE 1 CAPSULE  BY MOUTH EVERY DAY 10/30/23   Claudene Arthea HERO, DO  hydrochlorothiazide  (HYDRODIURIL ) 25 MG tablet Take 1 tablet (25 mg total) by mouth daily. 03/13/23   Rollene Almarie LABOR, MD  Multiple Vitamin (MULTI-VITAMIN DAILY PO) Take 1 tablet by mouth daily.    [provider]  valsartan  (DIOVAN ) 80 MG tablet Take 1 tablet (80 mg total) by mouth  daily. 03/13/23   Rollene Almarie LABOR, MD    Family History Family History  Problem Relation Age of Onset   Stroke Father    Hypertension Sister    Breast cancer Sister 30   Cancer Other        breast    Breast cancer Maternal Aunt 15   Breast cancer Cousin 40   Colon cancer Neg Hx     Social History Social History   Tobacco Use   Smoking status: Never    Passive exposure: Never   Smokeless tobacco: Never  Vaping Use   Vaping status: Never Used  Substance Use Topics   Alcohol use: No    Alcohol/week: 0.0 standard drinks of alcohol   Drug use: No     Allergies   Patient has no known allergies.   Review of Systems Review of Systems  Constitutional:  Negative for chills and fever.  HENT:  Positive for rhinorrhea, sneezing (resolved) and sore throat (improved). Negative for congestion.   Respiratory:  Positive for cough (congested, nonproductive), chest tightness (sometimes) and wheezing. Negative for shortness of breath.        Chest and back hurts when coughing    Cardiovascular:  Positive for palpitations (fluttering in chest intermittently for the past couple of weeks; no discomfort; occurs mainly at night when laying down). Negative for chest pain and leg swelling.  Gastrointestinal:  Negative for diarrhea, nausea and vomiting.  Neurological:  Negative for dizziness, numbness and headaches.  All other systems reviewed and are negative.    Physical Exam Triage Vital Signs ED Triage Vitals  Encounter Vitals Group     BP 11/01/23 0953 (!) 145/77     Girls Systolic BP Percentile --      Girls Diastolic BP Percentile --      Boys Systolic BP Percentile --      Boys Diastolic BP Percentile --      Pulse Rate 11/01/23 0953 80     Resp 11/01/23 0953 18     Temp 11/01/23 0953 98.4 F (36.9 C)     Temp Source 11/01/23 0953 Oral     SpO2 11/01/23 0953 98 %     Weight 11/01/23 0952 149 lb 7.6 oz (67.8 kg)     Height --      Head Circumference --      Peak  Flow --      Pain Score 11/01/23 0951 3     Pain Loc --      Pain Education --      Exclude from Growth Chart --    No data found.  Updated Vital Signs BP (!) 145/77 (BP Location: Left Arm)   Pulse 80   Temp 98.4 F (36.9 C) (Oral)   Resp 18   Wt 149 lb 7.6 oz (67.8 kg)   SpO2 98%   BMI 27.34 kg/m   Visual Acuity Right Eye Distance:   Left Eye Distance:   Bilateral Distance:    Right Eye Near:   Left Eye Near:    Bilateral Near:     Physical Exam  Vitals reviewed.  Constitutional:      General: She is awake. She is not in acute distress.    Appearance: Normal appearance. She is well-developed. She is not ill-appearing, toxic-appearing or diaphoretic.  HENT:     Head: Normocephalic.     Right Ear: Tympanic membrane, ear canal and external ear normal. No drainage, swelling or tenderness. No middle ear effusion. Tympanic membrane is not erythematous.     Left Ear: Tympanic membrane, ear canal and external ear normal. No drainage, swelling or tenderness.  No middle ear effusion. Tympanic membrane is not erythematous.     Nose: Congestion present. No rhinorrhea.     Mouth/Throat:     Lips: Pink.     Mouth: Mucous membranes are moist.     Pharynx: No pharyngeal swelling, oropharyngeal exudate, posterior oropharyngeal erythema or uvula swelling.     Tonsils: No tonsillar exudate or tonsillar abscesses.  Eyes:     General: Vision grossly intact.     Conjunctiva/sclera: Conjunctivae normal.  Cardiovascular:     Rate and Rhythm: Normal rate and regular rhythm.     Heart sounds: Normal heart sounds.  Pulmonary:     Effort: Pulmonary effort is normal. No tachypnea or respiratory distress.     Breath sounds: Normal breath sounds and air entry.     Comments: Congested cough observed. Lung sounds clear to auscultation bilaterally. Respirations are even and unlabored, and the patient is in no acute distress. Musculoskeletal:        General: Normal range of motion.     Cervical  back: Normal range of motion and neck supple.     Right lower leg: No edema.     Left lower leg: No edema.  Lymphadenopathy:     Cervical: No cervical adenopathy.  Skin:    General: Skin is warm and dry.  Neurological:     General: No focal deficit present.     Mental Status: She is alert and oriented to person, place, and time.  Psychiatric:        Behavior: Behavior is cooperative.      UC Treatments / Results  Labs (all labs ordered are listed, but only abnormal results are displayed) Labs Reviewed - No data to display  EKG   Radiology DG Chest 2 View Result Date: 11/01/2023 CLINICAL DATA:  Cough and wheezing. EXAM: CHEST - 2 VIEW COMPARISON:  None Available. FINDINGS: The lungs are clear without focal pneumonia, edema, pneumothorax or pleural effusion. The cardiopericardial silhouette is within normal limits for size. No acute bony abnormality. IMPRESSION: No active cardiopulmonary disease. Electronically Signed   By: Camellia Candle M.D.   On: 11/01/2023 11:09    Procedures Procedures (including critical care time)  Medications Ordered in UC Medications  ipratropium-albuterol (DUONEB) 0.5-2.5 (3) MG/3ML nebulizer solution 3 mL (3 mLs Nebulization Given 11/01/23 1109)    Initial Impression / Assessment and Plan / UC Course  I have reviewed the triage vital signs and the nursing notes.  Pertinent labs & imaging results that were available during my care of the patient were reviewed by me and considered in my medical decision making (see chart for details).     The patient presents with persistent cough and wheezing. Chest X-ray showed no evidence of pneumonia or other acute abnormalities. In clinic, symptoms improved following a Duoneb treatment. The patient is afebrile, nontoxic, with even and unlabored respirations and no acute distress. Clinical impression is acute bronchitis. Medications prescribed. Supportive measures including rest, hydration,  and over-the-counter  symptom relief were also advised. She also has had intermittent fluttering sensation in chest ongoing for approximately 2 weeks. No associated chest pain, or swelling in extremities. No dizziness, numbness, or tingling reported. Patient instructed to follow up with PCP if symptoms do not improve within several days and to seek emergency care for worsening shortness of breath, chest pain, persistent high fever, or other concerning symptoms.  Today's evaluation has revealed no signs of a dangerous process. Discussed diagnosis with patient and/or guardian. Patient and/or guardian aware of their diagnosis, possible red flag symptoms to watch out for and need for close follow up. Patient and/or guardian understands verbal and written discharge instructions. Patient and/or guardian comfortable with plan and disposition.  Patient and/or guardian has a clear mental status at this time, good insight into illness (after discussion and teaching) and has clear judgment to make decisions regarding their care  Documentation was completed with the aid of voice recognition software. Transcription may contain typographical errors.   Final Clinical Impressions(s) / UC Diagnoses   Final diagnoses:  Acute cough  Acute bronchitis due to Rhinovirus  Intermittent palpitations     Discharge Instructions      You have been diagnosed with acute bronchitis, which is a sudden inflammation of the large airways in your lungs. This inflammation causes the airways to narrow and produce more mucus, making it harder to breathe and leading to coughing. Acute bronchitis is most often caused by the same viruses that cause the common cold. Take the medications that were prescribed to you as directed. If you have a fever, headache, or body aches, you can also take Tylenol or ibuprofen to help you feel more comfortable. Be sure to drink plenty of fluids to stay hydrated--aim for enough to keep your urine a pale yellow color. This will  also help to thin mucus and make it easier to clear from your body. The prescribed cough medicine should help loosen mucus, relieve congestion, and ease your cough. Taking two teaspoons of honey at bedtime may also help reduce nighttime coughing. Avoid using any nicotine or tobacco products, as they can worsen your symptoms and delay healing.  Using a cool mist humidifier at home to keep humidity levels above 50% can be helpful. You can also inhale steam for 10 to 15 minutes, 3 to 4 times a day. This can be done by sitting in the bathroom with a hot shower running, or by using over-the-counter vapor shower tablets to help with nasal congestion. Try to avoid cool or dry air as much as possible. Be sure to get enough rest every night to support your recovery. Don't forget to replace your toothbrush once you start feeling better.  It's normal for a cough to linger for several weeks after a respiratory illness, even after other symptoms have resolved. This happens because the airways remain irritated and take time to fully heal. As long as the cough gradually improves and there are no new concerning symptoms, this is part of the normal recovery process. You also mentioned intermittent fluttering sensation in your chest that has been occurring for about two weeks. There is no associated chest pain, swelling in your legs or feet, dizziness, numbness, or tingling reported. At this time, your symptoms are stable, and you should schedule a follow-up appointment with your primary care provider for further evaluation. Please seek emergency care right away if you develop chest pain, shortness of breath, fainting, severe dizziness, or swelling in your legs or  ankles, cough up blood, develop a severe headache, or experience worsening fever or chills.     ED Prescriptions     Medication Sig Dispense Auth. Provider   doxycycline  (VIBRAMYCIN ) 100 MG capsule Take 1 capsule (100 mg total) by mouth 2 (two) times daily. 14  capsule Shabreka Coulon, [place], FNP   methylPREDNISolone  (MEDROL  DOSEPAK) 4 MG TBPK tablet Take as directed 21 tablet Rexton Greulich, FNP   benzonatate  (TESSALON ) 100 MG capsule Take 1 capsule (100 mg total) by mouth 3 (three) times daily as needed for cough. 30 capsule Malicia Blasdel, [place], FNP   Dextromethorphan-guaiFENesin (MUCINEX DM MAXIMUM STRENGTH) 60-1200 MG TB12 Take 1 tablet by mouth 2 (two) times daily. 20 tablet Iola Lukes, FNP   albuterol (VENTOLIN HFA) 108 (90 Base) MCG/ACT inhaler Inhale 2 puffs into the lungs every 4 (four) hours as needed for wheezing or shortness of breath (bronchospasms). 18 g Iola Lukes, FNP      PDMP not reviewed this encounter.   Iola Lukes, [place] 11/01/23 [phone]

## 2023-11-01 NOTE — Discharge Instructions (Addendum)
 You have been diagnosed with acute bronchitis, which is a sudden inflammation of the large airways in your lungs. This inflammation causes the airways to narrow and produce more mucus, making it harder to breathe and leading to coughing. Acute bronchitis is most often caused by the same viruses that cause the common cold. Take the medications that were prescribed to you as directed. If you have a fever, headache, or body aches, you can also take Tylenol or ibuprofen to help you feel more comfortable. Be sure to drink plenty of fluids to stay hydrated--aim for enough to keep your urine a pale yellow color. This will also help to thin mucus and make it easier to clear from your body. The prescribed cough medicine should help loosen mucus, relieve congestion, and ease your cough. Taking two teaspoons of honey at bedtime may also help reduce nighttime coughing. Avoid using any nicotine or tobacco products, as they can worsen your symptoms and delay healing.  Using a cool mist humidifier at home to keep humidity levels above 50% can be helpful. You can also inhale steam for 10 to 15 minutes, 3 to 4 times a day. This can be done by sitting in the bathroom with a hot shower running, or by using over-the-counter vapor shower tablets to help with nasal congestion. Try to avoid cool or dry air as much as possible. Be sure to get enough rest every night to support your recovery. Don't forget to replace your toothbrush once you start feeling better.  It's normal for a cough to linger for several weeks after a respiratory illness, even after other symptoms have resolved. This happens because the airways remain irritated and take time to fully heal. As long as the cough gradually improves and there are no new concerning symptoms, this is part of the normal recovery process. You also mentioned intermittent fluttering sensation in your chest that has been occurring for about two weeks. There is no associated chest pain, swelling in  your legs or feet, dizziness, numbness, or tingling reported. At this time, your symptoms are stable, and you should schedule a follow-up appointment with your primary care provider for further evaluation. Please seek emergency care right away if you develop chest pain, shortness of breath, fainting, severe dizziness, or swelling in your legs or ankles, cough up blood, develop a severe headache, or experience worsening fever or chills.

## 2023-11-01 NOTE — ED Triage Notes (Addendum)
 Pt presents c/o cough, runny nose, and sore throat x 10 days. Pt reports she is prone to getting ta really bad head cold about once a year around this time. Pt reports trying OTC cough meds thus far. Pt denies emesis and diarrhea. Pt tested negative for COVID and Flu 6 days ago.

## 2023-11-24 ENCOUNTER — Ambulatory Visit: Payer: Self-pay

## 2023-11-24 ENCOUNTER — Encounter: Payer: Self-pay | Admitting: Family Medicine

## 2023-11-24 ENCOUNTER — Ambulatory Visit: Admitting: Family Medicine

## 2023-11-24 VITALS — BP 120/78 | HR 75 | Temp 98.0°F | Resp 18 | Ht 62.0 in | Wt 144.0 lb

## 2023-11-24 DIAGNOSIS — J208 Acute bronchitis due to other specified organisms: Secondary | ICD-10-CM | POA: Diagnosis not present

## 2023-11-24 MED ORDER — PREDNISONE 20 MG PO TABS: 40.0000 mg | ORAL_TABLET | Freq: Every day | ORAL | 0 refills | Status: AC

## 2023-11-24 NOTE — Progress Notes (Signed)
 Assessment & Plan Viral bronchitis Persistent cough with rib pain and wheezing likely due to lung and intercostal muscle inflammation. Previous asthma history noted. Insufficient response to prior steroid taper. - Prescribed prednisone  40 mg daily for 5 days. - Instructed on proper albuterol inhaler use. - Advised delaying COVID vaccination until symptom improvement.  Orders:   predniSONE  (DELTASONE ) 20 MG tablet; Take 2 tablets (40 mg total) by mouth daily for 5 days.   Follow up plan: Return if symptoms worsen or fail to improve.  Niki Rung, MSN, APRN, FNP-C  Subjective:  HPI: Kayla Key is a 78 y.o. female presenting on 11/24/2023 for Cough (Continued cough, runny nose, ribs sore, can't sleep, some wheezing  - was Dx with Bronchitis 11/01/23, completed Doxy, pred, tessalon  - still has mucinex and alb hfa )  Discussed the use of AI scribe software for clinical note transcription with the patient, who gave verbal consent to proceed.  She has been experiencing a persistent cough that initially improved but has since lingered without further improvement. The cough is particularly bothersome at night, affecting her sleep. No baseline cough, shortness of breath, or wheezing when not sick. She denies smoking.  She describes rib pain that worsens with deep breaths, bending, or lifting. The pain is localized to a specific area on her ribs, though she is unsure if it is between or on top of the ribs.  She has been using an albuterol inhaler but is uncertain about its effectiveness, as she does not notice a difference when using it. She has a history of similar symptoms occurring annually around this time, which was previously attributed to asthma about ten to fifteen years ago.  She was previously given a steroid taper, described as a four-day course starting at 24 mg and tapering down, but it did not resolve her symptoms.       ROS: Negative unless specifically indicated above in  HPI.   Relevant past medical history reviewed and updated as indicated.   Allergies and medications reviewed and updated.   Current Outpatient Medications:    albuterol (VENTOLIN HFA) 108 (90 Base) MCG/ACT inhaler, Inhale 2 puffs into the lungs every 4 (four) hours as needed for wheezing or shortness of breath (bronchospasms)., Disp: 18 g, Rfl: 0   Dextromethorphan-guaiFENesin (MUCINEX DM MAXIMUM STRENGTH) 60-1200 MG TB12, Take 1 tablet by mouth 2 (two) times daily., Disp: 20 tablet, Rfl: 0   DULoxetine  (CYMBALTA ) 30 MG capsule, TAKE 1 CAPSULE BY MOUTH EVERY DAY, Disp: 90 capsule, Rfl: 1   hydrochlorothiazide  (HYDRODIURIL ) 25 MG tablet, Take 1 tablet (25 mg total) by mouth daily., Disp: 90 tablet, Rfl: 3   Multiple Vitamin (MULTI-VITAMIN DAILY PO), Take 1 tablet by mouth daily., Disp: , Rfl:    predniSONE  (DELTASONE ) 20 MG tablet, Take 2 tablets (40 mg total) by mouth daily for 5 days., Disp: 10 tablet, Rfl: 0   valsartan  (DIOVAN ) 80 MG tablet, Take 1 tablet (80 mg total) by mouth daily., Disp: 90 tablet, Rfl: 3  No Known Allergies  Objective:   BP 120/78   Pulse 75   Temp 98 F (36.7 C)   Resp 18   Ht 5' 2 (1.575 m)   Wt 144 lb (65.3 kg)   SpO2 99%   BMI 26.34 kg/m    Physical Exam Vitals reviewed.  Constitutional:      General: She is not in acute distress.    Appearance: Normal appearance. She is not ill-appearing, toxic-appearing or diaphoretic.  HENT:     Head: Normocephalic and atraumatic.  Eyes:     General: No scleral icterus.       Right eye: No discharge.        Left eye: No discharge.     Conjunctiva/sclera: Conjunctivae normal.  Cardiovascular:     Rate and Rhythm: Normal rate and regular rhythm.     Heart sounds: Normal heart sounds. No murmur heard.    No friction rub. No gallop.  Pulmonary:     Effort: Pulmonary effort is normal. No respiratory distress.     Breath sounds: Normal breath sounds. No stridor. No wheezing, rhonchi or rales.  Chest:      Chest wall: Tenderness (left side ribs) present.  Musculoskeletal:        General: Normal range of motion.     Cervical back: Normal range of motion.  Skin:    General: Skin is warm and dry.     Capillary Refill: Capillary refill takes less than 2 seconds.  Neurological:     General: No focal deficit present.     Mental Status: She is alert and oriented to person, place, and time. Mental status is at baseline.  Psychiatric:        Mood and Affect: Mood normal.        Behavior: Behavior normal.        Thought Content: Thought content normal.        Judgment: Judgment normal.

## 2023-11-24 NOTE — Telephone Encounter (Signed)
 FYI Only or Action Required?: FYI only for provider.  Patient was last seen in primary care on 03/13/2023 by Rollene Almarie LABOR, MD.  Called Nurse Triage reporting Cough. - diagnosed with bronchitis 3 weeks ago at [facility]  Symptoms began several weeks ago.  Interventions attempted: Other: steroids, cough medication, both tessalon  and Mucinex - D.  Symptoms are: unchanged.  Triage Disposition: See PCP When Office is Open (Within 3 Days)  Patient/caregiver understands and will follow disposition?: Yes - appt for this afternoon scheduled.                             Copied from CRM [id number]. Topic: Clinical - Red Word Triage >> Nov 24, 2023  9:25 AM Adelita E wrote: [facility] that prompted transfer to Nurse Triage: Acute bronchitis going on for 3 weeks, when patient bends over and takes a deep breath she is having rib cage pain. Reason for Disposition  [1] Chest or rib pain AND [2] only occurs while coughing  Answer Assessment - Initial Assessment Questions 1. ONSET: When did the cough begin?      3 weeks ago 2. SEVERITY: How bad is the cough today?      moderate 3. SPUTUM: Describe the color of your sputum (e.g., none, dry cough; clear, white, yellow, green)     unknown 4. HEMOPTYSIS: Are you coughing up any blood? If so ask: How much? (e.g., flecks, streaks, tablespoons, etc.)     unknown 5. DIFFICULTY BREATHING: Are you having difficulty breathing? If Yes, ask: How bad is it? (e.g., mild, moderate, severe)      Yes -mild 6. FEVER: Do you have a fever? If Yes, ask: What is your temperature, how was it measured, and when did it start?     unknown 7. CARDIAC HISTORY: Do you have any history of heart disease? (e.g., heart attack, congestive heart failure)      unknown 8. LUNG HISTORY: Do you have any history of lung disease?  (e.g., pulmonary embolus, asthma, emphysema)     Diagnosed with bronchitis 3 weeks ago  10. OTHER SYMPTOMS:  Do you have any other symptoms? (e.g., runny nose, wheezing, chest pain)       Pain with deep breath  Protocols used: Cough - Chronic-A-AH

## 2023-11-26 ENCOUNTER — Ambulatory Visit
Admission: RE | Admit: 2023-11-26 | Discharge: 2023-11-26 | Disposition: A | Discharge status: Home / Self Care | Source: Ambulatory Visit | Attending: Internal Medicine | Admitting: Internal Medicine

## 2023-11-26 DIAGNOSIS — Z1231 Encounter for screening mammogram for malignant neoplasm of breast: Secondary | ICD-10-CM | POA: Diagnosis not present

## 2023-11-26 DIAGNOSIS — Z0000 Encounter for general adult medical examination without abnormal findings: Secondary | ICD-10-CM

## 2023-12-01 ENCOUNTER — Ambulatory Visit: Payer: Self-pay | Admitting: Internal Medicine

## 2023-12-18 NOTE — Progress Notes (Signed)
 [facility] [address] [place] [address] Phone: [phone] Subjective:   Kayla Key, am serving as a scribe for Dr. Arthea Claudene.  I'm seeing this patient by the request  of:  Rollene Almarie LABOR, MD  CC: Bilateral shoulder pain  YEP:Dlagzrupcz  10/08/2023 Bilateral injections noted.  Discussed icing regimen and home exercises, discussed which activities to do and which ones to avoid.  Increase activity slowly.  Discussed icing regimen.  Follow-up again in 12 weeks started Cymbalta  30 mg for her other arthritic changes.  Hopeful that patient will make improvement.      Update 12/23/2023 Kayla Key is a 78 y.o. female coming in with complaint of B shoulder pain.  Last time we saw patient injection was 3 months ago.  Started on Cymbalta  20 mg.  Patient states injections did help some. Good ROM that is not painful. Knees are okay, but lower leg especially the L has been in some discomfort. Feels like something is crawling on her.   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    Past Medical History:  Diagnosis Date   Arthritis    Asthma    GERD (gastroesophageal reflux disease)    Hypertension    MHA (microangiopathic hemolytic anemia) (HCC)    Osteoporosis    Past Surgical History:  Procedure Laterality Date   BUNIONECTOMY Right    KNEE ARTHROSCOPY Right 2009   KNEE ARTHROSCOPY Left 2015   TOE SURGERY Bilateral 2005   TUBAL LIGATION  1988   Social History   Socioeconomic History   Marital status: Single    Spouse name: Not on file   Number of children: Not on file   Years of education: Not on file   Highest education level: Not on file  Occupational History   Not on file  Tobacco Use   Smoking status: Never    Passive exposure: Never   Smokeless tobacco: Never  Vaping Use   Vaping status: Never Used  Substance and Sexual Activity   Alcohol use: No    Alcohol/week:  0.0 standard drinks of alcohol   Drug use: No   Sexual activity: Not Currently  Other Topics Concern   Not on file  Social History Narrative   Not on file   Social Drivers of Health   Financial Resource Strain: Low Risk  (07/29/2022)   Overall Financial Resource Strain (CARDIA)    Difficulty of Paying Living Expenses: Not hard at all  Food Insecurity: No Food Insecurity (07/29/2022)   Hunger Vital Sign    Worried About Running Out of Food in the Last Year: Never true    Ran Out of Food in the Last Year: Never true  Transportation Needs: No Transportation Needs (07/29/2022)   PRAPARE - Administrator, Civil Service (Medical): No    Lack of Transportation (Non-Medical): No  Physical Activity: Inactive (07/29/2022)   Exercise Vital Sign    Days of Exercise per Week: 0 days    Minutes of Exercise per Session: 0 min  Stress: No Stress Concern Present (07/29/2022)   [employer] of Occupational Health - Occupational Stress Questionnaire    Feeling of Stress : Not at all  Social Connections: Moderately Integrated (07/29/2022)   Social Connection and Isolation Panel    Frequency of Communication with Friends and Family: More than three times a week    Frequency of Social Gatherings with Friends  and Family: More than three times a week    Attends Religious Services: More than 4 times per year    Active Member of Clubs or Organizations: Yes    Attends Banker Meetings: More than 4 times per year    Marital Status: Widowed   No Known Allergies Family History  Problem Relation Age of Onset   Stroke Father    Hypertension Sister    Breast cancer Sister 70   Cancer Other        breast    Breast cancer Maternal Aunt 56   Breast cancer Cousin 40   Colon cancer Neg Hx      Current Outpatient Medications (Cardiovascular):    hydrochlorothiazide  (HYDRODIURIL ) 25 MG tablet, Take 1 tablet (25 mg total) by mouth daily.   valsartan  (DIOVAN ) 80 MG tablet, Take 1 tablet  (80 mg total) by mouth daily.  Current Outpatient Medications (Respiratory):    albuterol  (VENTOLIN  HFA) 108 (90 Base) MCG/ACT inhaler, Inhale 2 puffs into the lungs every 4 (four) hours as needed for wheezing or shortness of breath (bronchospasms).   Dextromethorphan-guaiFENesin  (MUCINEX  DM MAXIMUM STRENGTH) 60-1200 MG TB12, Take 1 tablet by mouth 2 (two) times daily.    Current Outpatient Medications (Other):    DULoxetine  (CYMBALTA ) 30 MG capsule, TAKE 1 CAPSULE BY MOUTH EVERY DAY   Multiple Vitamin (MULTI-VITAMIN DAILY PO), Take 1 tablet by mouth daily.   Reviewed prior external information including notes and imaging from  primary care provider As well as notes that were available from care everywhere and other healthcare systems.  Past medical history, social, surgical and family history all reviewed in electronic medical record.  No pertanent information unless stated regarding to the chief complaint.   Review of Systems:  No , visual changes, nausea, vomiting, diarrhea, constipation, dizziness, abdominal pain, skin rash, fevers, chills, night sweats, weight loss, swollen lymph nodes, body aches, joint swelling, chest pain, shortness of breath, mood changes. POSITIVE muscle aches does have fatigue and headaches in the morning  Objective  Blood pressure 108/78, pulse 76, height 5' 2 (1.575 m), weight 146 lb (66.2 kg), SpO2 96%.   General: No apparent distress alert and oriented x3 mood and affect normal, dressed appropriately.  HEENT: Pupils equal, extraocular movements intact  Respiratory: Patient's speak in full sentences and does not appear short of breath   Bilateral shoulder shows some arthritic changes but nontender on exam today.  Lower extremity shows a 1+ dorsalis pedis pulse noted.  This is on the left side.  Patient is minorly tender noted.  Right knee replacement is noted.  Neurovascularly intact it appears.   Impression and Recommendations:     The above  documentation has been reviewed and is accurate and complete Kayla Stankiewicz M Hercules Hasler, DO

## 2023-12-23 ENCOUNTER — Ambulatory Visit: Admitting: Family Medicine

## 2023-12-23 ENCOUNTER — Other Ambulatory Visit: Payer: Self-pay

## 2023-12-23 VITALS — BP 108/78 | HR 76 | Ht 62.0 in | Wt 146.0 lb

## 2023-12-23 DIAGNOSIS — R0683 Snoring: Secondary | ICD-10-CM | POA: Diagnosis not present

## 2023-12-23 DIAGNOSIS — G8929 Other chronic pain: Secondary | ICD-10-CM | POA: Diagnosis not present

## 2023-12-23 DIAGNOSIS — M25511 Pain in right shoulder: Secondary | ICD-10-CM

## 2023-12-23 DIAGNOSIS — M25562 Pain in left knee: Secondary | ICD-10-CM | POA: Diagnosis not present

## 2023-12-23 DIAGNOSIS — M25561 Pain in right knee: Secondary | ICD-10-CM

## 2023-12-23 DIAGNOSIS — M25512 Pain in left shoulder: Secondary | ICD-10-CM

## 2023-12-23 DIAGNOSIS — M810 Age-related osteoporosis without current pathological fracture: Secondary | ICD-10-CM | POA: Diagnosis not present

## 2023-12-23 NOTE — Patient Instructions (Addendum)
 [facility] [address] [place],  [place]  [address] Main: [phone]  Referral to pulmonology Sleep study See you again in 3 months

## 2023-12-23 NOTE — Assessment & Plan Note (Signed)
 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  Patient wanted to hold at this time with injections.  Worsening will consider.  Follow-up with me again in 2 to 3 months

## 2023-12-23 NOTE — Assessment & Plan Note (Signed)
 New problem of snoring at night with leg cramps.  Will do think that this could be contributing.  We discussed the ABI testing as well as a sleep study to further evaluate.  Discussed icing regimen and home exercises otherwise.  Follow-up with me again in 2 to 3 months

## 2023-12-23 NOTE — Assessment & Plan Note (Signed)
 Stable, continue with current therapy.

## 2023-12-24 ENCOUNTER — Ambulatory Visit (HOSPITAL_COMMUNITY)
Admission: RE | Admit: 2023-12-24 | Discharge: 2023-12-24 | Disposition: A | Discharge status: Home / Self Care | Source: Ambulatory Visit | Attending: Family Medicine | Admitting: Family Medicine

## 2023-12-24 DIAGNOSIS — M25562 Pain in left knee: Secondary | ICD-10-CM | POA: Insufficient documentation

## 2023-12-24 DIAGNOSIS — M25561 Pain in right knee: Secondary | ICD-10-CM | POA: Diagnosis not present

## 2023-12-27 LAB — VAS US LOWER EXT ART SEG MULTI (SEGMENTALS & LE RAYNAUDS)
Left ABI: 1.25
Right ABI: 1.28

## 2023-12-28 ENCOUNTER — Ambulatory Visit: Payer: Self-pay | Admitting: Family Medicine

## 2023-12-29 ENCOUNTER — Other Ambulatory Visit: Payer: Self-pay | Admitting: Family Medicine

## 2023-12-29 DIAGNOSIS — M25561 Pain in right knee: Secondary | ICD-10-CM

## 2024-02-11 ENCOUNTER — Other Ambulatory Visit: Payer: Self-pay | Admitting: Internal Medicine

## 2024-02-25 ENCOUNTER — Ambulatory Visit

## 2024-02-25 VITALS — BP 118/78 | HR 72 | Ht 62.0 in | Wt 148.2 lb

## 2024-02-25 DIAGNOSIS — Z0000 Encounter for general adult medical examination without abnormal findings: Secondary | ICD-10-CM

## 2024-02-25 NOTE — Patient Instructions (Addendum)
 Ms. Fedie,  Thank you for taking the time for your Medicare Wellness Visit. I appreciate your continued commitment to your health goals. Please review the care plan we discussed, and feel free to reach out if I can assist you further.  Please note that Annual Wellness Visits do not include a physical exam. Some assessments may be limited, especially if the visit was conducted virtually. If needed, we may recommend an in-person follow-up with your provider.  Ongoing Care Seeing your primary care provider every 3 to 6 months helps us  monitor your health and provide consistent, personalized care.   Referrals If a referral was made during today's visit and you haven't received any updates within two weeks, please contact the referred provider directly to check on the status.  Recommended Screenings:  Health Maintenance  Topic Date Due   DTaP/Tdap/Td vaccine (2 - Tdap) 06/16/2016   COVID-19 Vaccine (9 - 2025-26 season) 06/09/2024   Medicare Annual Wellness Visit  02/24/2025   Pneumococcal Vaccine for age over 87  Completed   Flu Shot  Completed   Osteoporosis screening with Bone Density Scan  Completed   Hepatitis C Screening  Completed   Zoster (Shingles) Vaccine  Completed   Meningitis B Vaccine  Aged Out   Breast Cancer Screening  Discontinued   Colon Cancer Screening  Discontinued       02/25/2024    4:07 PM  Advanced Directives  Does Patient Have a Medical Advance Directive? No  Would patient like information on creating a medical advance directive? Yes (MAU/Ambulatory/Procedural Areas - Information given)    Vision: Annual vision screenings are recommended for early detection of glaucoma, cataracts, and diabetic retinopathy. These exams can also reveal signs of chronic conditions such as diabetes and high blood pressure.  Dental: Annual dental screenings help detect early signs of oral cancer, gum disease, and other conditions linked to overall health, including heart disease  and diabetes.

## 2024-02-25 NOTE — Progress Notes (Addendum)
 "  Chief Complaint  Patient presents with   Medicare Wellness     Subjective:  Please attest and cosign this visit due to patients primary care provider not being in the office at the time the visit was completed.  (Pt of Dr Almarie Cleveland)   Kayla Key is a 79 y.o. female who presents for a Medicare Annual Wellness Visit.  Visit info / Clinical Intake: Medicare Wellness Visit Type:: Subsequent Annual Wellness Visit Persons participating in visit and providing information:: patient Medicare Wellness Visit Mode:: In-person (required for WTM) Interpreter Needed?: No Pre-visit prep was completed: yes AWV questionnaire completed by patient prior to visit?: no Living arrangements:: with family/others Patient's Overall Health Status Rating: good Typical amount of pain: none Does pain affect daily life?: no Are you currently prescribed opioids?: no  Dietary Habits and Nutritional Risks How many meals a day?: 2 Eats fruit and vegetables daily?: yes Most meals are obtained by: preparing own meals; eating out In the last 2 weeks, have you had any of the following?: none Diabetic:: no  Functional Status Activities of Daily Living (to include ambulation/medication): Independent Ambulation: Independent with device- listed below Home Assistive Devices/Equipment: Eyeglasses Medication Administration: Independent Home Management (perform basic housework or laundry): Independent Manage your own finances?: yes Primary transportation is: driving Concerns about vision?: no *vision screening is required for WTM* Concerns about hearing?: no  Fall Screening Falls in the past year?: 0 Number of falls in past year: 0 Was there an injury with Fall?: 0 Fall Risk Category Calculator: 0 Patient Fall Risk Level: Low Fall Risk  Fall Risk Patient at Risk for Falls Due to: No Fall Risks Fall risk Follow up: Falls evaluation completed; Falls prevention discussed  Home and Transportation  Safety: All rugs have non-skid backing?: N/A, no rugs All stairs or steps have railings?: N/A, no stairs Grab bars in the bathtub or shower?: (!) no Have non-skid surface in bathtub or shower?: yes Good home lighting?: yes Regular seat belt use?: yes Hospital stays in the last year:: no  Cognitive Assessment Difficulty concentrating, remembering, or making decisions? : no Will 6CIT or Mini Cog be Completed: yes What year is it?: 0 points What month is it?: 0 points Give patient an address phrase to remember (5 components): [address] [place], [place] About what time is it?: 0 points Count backwards from 20 to 1: 0 points Say the months of the year in reverse: 0 points Repeat the address phrase from earlier: 0 points 6 CIT Score: 0 points  Advance Directives (For Healthcare) Does Patient Have a Medical Advance Directive?: No Would patient like information on creating a medical advance directive?: Yes (MAU/Ambulatory/Procedural Areas - Information given)  Reviewed/Updated  Reviewed/Updated: Reviewed All (Medical, Surgical, Family, Medications, Allergies, Care Teams, Patient Goals)    Allergies (verified) Patient has no known allergies.   Current Medications (verified) Outpatient Encounter Medications as of 02/25/2024  Medication Sig   albuterol  (VENTOLIN  HFA) 108 (90 Base) MCG/ACT inhaler Inhale 2 puffs into the lungs every 4 (four) hours as needed for wheezing or shortness of breath (bronchospasms).   Dextromethorphan-guaiFENesin  (MUCINEX  DM MAXIMUM STRENGTH) 60-1200 MG TB12 Take 1 tablet by mouth 2 (two) times daily. (Patient taking differently: Take 1 tablet by mouth 2 (two) times daily. prn)   DULoxetine  (CYMBALTA ) 30 MG capsule TAKE 1 CAPSULE BY MOUTH EVERY DAY   hydrochlorothiazide  (HYDRODIURIL ) 25 MG tablet Take 1 tablet (25 mg total) by mouth daily.   Multiple Vitamin (MULTI-VITAMIN DAILY  PO) Take 1 tablet by mouth daily.   valsartan  (DIOVAN ) 80 MG tablet TAKE 1 TABLET  BY MOUTH EVERY DAY   No facility-administered encounter medications on file as of 02/25/2024.    History: Past Medical History:  Diagnosis Date   Arthritis    Asthma    GERD (gastroesophageal reflux disease)    Hypertension    MHA (microangiopathic hemolytic anemia) (HCC)    Osteoporosis    Past Surgical History:  Procedure Laterality Date   BUNIONECTOMY Right    KNEE ARTHROSCOPY Right 2009   KNEE ARTHROSCOPY Left 2015   TOE SURGERY Bilateral 2005   TUBAL LIGATION  1988   Family History  Problem Relation Age of Onset   Stroke Father    Hypertension Sister    Breast cancer Sister 61   Cancer Other        breast    Breast cancer Maternal Aunt 88   Breast cancer Cousin 40   Colon cancer Neg Hx    Social History   Occupational History   Not on file  Tobacco Use   Smoking status: Never    Passive exposure: Never   Smokeless tobacco: Never  Vaping Use   Vaping status: Never Used  Substance and Sexual Activity   Alcohol use: No    Alcohol/week: 0.0 standard drinks of alcohol   Drug use: No   Sexual activity: Not Currently   Tobacco Counseling Counseling given: No  SDOH Screenings   Food Insecurity: No Food Insecurity (02/25/2024)  Housing: Unknown (02/25/2024)  Transportation Needs: No Transportation Needs (02/25/2024)  Utilities: Not At Risk (02/25/2024)  Alcohol Screen: Low Risk (07/29/2022)  Depression (PHQ2-9): Low Risk (02/25/2024)  Financial Resource Strain: Low Risk (07/29/2022)  Physical Activity: Inactive (02/25/2024)  Social Connections: Moderately Integrated (02/25/2024)  Stress: No Stress Concern Present (02/25/2024)  Tobacco Use: Low Risk (02/25/2024)  Health Literacy: Adequate Health Literacy (02/25/2024)   See flowsheets for full screening details  Depression Screen PHQ 2 & 9 Depression Scale- Over the past 2 weeks, how often have you been bothered by any of the following problems? Little interest or pleasure in doing things: 0 Feeling down,  depressed, or hopeless (PHQ Adolescent also includes...irritable): 0 PHQ-2 Total Score: 0 Trouble falling or staying asleep, or sleeping too much: 0 Feeling tired or having little energy: 0 Poor appetite or overeating (PHQ Adolescent also includes...weight loss): 0 Feeling bad about yourself - or that you are a failure or have let yourself or your family down: 0 Trouble concentrating on things, such as reading the newspaper or watching television (PHQ Adolescent also includes...like school work): 0 Moving or speaking so slowly that other people could have noticed. Or the opposite - being so fidgety or restless that you have been moving around a lot more than usual: 0 Thoughts that you would be better off dead, or of hurting yourself in some way: 0 PHQ-9 Total Score: 0 If you checked off any problems, how difficult have these problems made it for you to do your work, take care of things at home, or get along with other people?: Not difficult at all  Depression Treatment Depression Interventions/Treatment : [id number] Score <4 Follow-up Not Indicated     Goals Addressed               This Visit's Progress     Patient Stated (pt-stated)        Patient stated he plans to continue being active - still working  Objective:    Today's Vitals   02/25/24 1606  BP: 118/78  Pulse: 72  Weight: 148 lb 3.2 oz (67.2 kg)  Height: 5' 2 (1.575 m)   Body mass index is 27.11 kg/m.  Hearing/Vision screen Hearing Screening - Comments:: Denies hearing difficulties   Vision Screening - Comments:: Wears rx glasses - up to date with routine eye exams with [facility] Immunizations and Health Maintenance Health Maintenance  Topic Date Due   DTaP/Tdap/Td (2 - Tdap) 06/16/2016   COVID-19 Vaccine (9 - 2025-26 season) 06/09/2024   Medicare Annual Wellness (AWV)  02/24/2025   Pneumococcal Vaccine: 50+ Years  Completed   Influenza Vaccine  Completed   Bone Density Scan  Completed    Hepatitis C Screening  Completed   Zoster Vaccines- Shingrix  Completed   Meningococcal B Vaccine  Aged Out   Mammogram  Discontinued   Colonoscopy  Discontinued        Assessment/Plan:  This is a routine wellness examination for Kayla Key.  Patient Care Team: Rollene Almarie LABOR, MD as PCP - General (Internal Medicine)  I have personally reviewed and noted the following in the patients chart:   Medical and social history Use of alcohol, tobacco or illicit drugs  Current medications and supplements including opioid prescriptions. Functional ability and status Nutritional status Physical activity Advanced directives List of other physicians Hospitalizations, surgeries, and ER visits in previous 12 months Vitals Screenings to include cognitive, depression, and falls Referrals and appointments  No orders of the defined types were placed in this encounter.  In addition, I have reviewed and discussed with patient certain preventive protocols, quality metrics, and best practice recommendations. A written personalized care plan for preventive services as well as general preventive health recommendations were provided to patient.   Kayla Key CHRISTELLA Saba, CMA   02/25/2024   Return in 1 year (on 02/24/2025).  After Visit Summary: (In Person-Declined) Patient declined AVS at this time.  Nurse Notes: scheduled 2027 AWV appt "

## 2024-03-24 ENCOUNTER — Ambulatory Visit: Admitting: Family Medicine
